# Patient Record
Sex: Female | Born: 1995 | Race: Black or African American | Hispanic: No | Marital: Single
Health system: Midwestern US, Community
[De-identification: ages and names within clinical notes are randomized; demographics above are authoritative.]

## PROBLEM LIST (undated history)

## (undated) ENCOUNTER — Inpatient Hospital Stay (HOSPITAL_COMMUNITY): Payer: Self-pay

## (undated) ENCOUNTER — Emergency Department (HOSPITAL_COMMUNITY): Admission: EM | Payer: No Typology Code available for payment source | Source: Home / Self Care

## (undated) DIAGNOSIS — A549 Gonococcal infection, unspecified: Secondary | ICD-10-CM

## (undated) DIAGNOSIS — E559 Vitamin D deficiency, unspecified: Secondary | ICD-10-CM

## (undated) DIAGNOSIS — G629 Polyneuropathy, unspecified: Secondary | ICD-10-CM

## (undated) DIAGNOSIS — F329 Major depressive disorder, single episode, unspecified: Secondary | ICD-10-CM

## (undated) DIAGNOSIS — F909 Attention-deficit hyperactivity disorder, unspecified type: Secondary | ICD-10-CM

## (undated) DIAGNOSIS — F32A Depression, unspecified: Secondary | ICD-10-CM

## (undated) DIAGNOSIS — F419 Anxiety disorder, unspecified: Secondary | ICD-10-CM

## (undated) DIAGNOSIS — Z973 Presence of spectacles and contact lenses: Secondary | ICD-10-CM

## (undated) HISTORY — DX: Polyneuropathy, unspecified: G62.9

## (undated) HISTORY — PX: OTHER SURGICAL HISTORY: SHX169

## (undated) HISTORY — PX: FRACTURE SURGERY: SHX138

## (undated) HISTORY — DX: Attention-deficit hyperactivity disorder, unspecified type: F90.9

## (undated) HISTORY — DX: Vitamin D deficiency, unspecified: E55.9

---
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
## 2015-03-26 ENCOUNTER — Emergency Department (HOSPITAL_COMMUNITY)
Admission: EM | Admit: 2015-03-26 | Discharge: 2015-03-27 | Disposition: A | Discharge status: Home / Self Care | Payer: Medicaid Other | Attending: Emergency Medicine | Admitting: Emergency Medicine

## 2015-03-26 DIAGNOSIS — N946 Dysmenorrhea, unspecified: Secondary | ICD-10-CM | POA: Diagnosis not present

## 2015-03-26 DIAGNOSIS — Z8619 Personal history of other infectious and parasitic diseases: Secondary | ICD-10-CM | POA: Insufficient documentation

## 2015-03-26 DIAGNOSIS — Z3202 Encounter for pregnancy test, result negative: Secondary | ICD-10-CM | POA: Insufficient documentation

## 2015-03-26 DIAGNOSIS — R109 Unspecified abdominal pain: Secondary | ICD-10-CM | POA: Diagnosis present

## 2015-03-26 NOTE — ED Notes (Signed)
Bed: [facility] Expected date:  Expected time:  Means of arrival:  Comments: 54F Abd pain

## 2015-03-27 ENCOUNTER — Encounter (HOSPITAL_COMMUNITY): Payer: Self-pay | Admitting: Emergency Medicine

## 2015-03-27 LAB — LIPASE, BLOOD: Lipase: 17 U/L — ABNORMAL LOW (ref 22–51)

## 2015-03-27 LAB — COMPREHENSIVE METABOLIC PANEL
ALT: 11 U/L — ABNORMAL LOW (ref 14–54)
AST: 17 U/L (ref 15–41)
Albumin: 3.8 g/dL (ref 3.5–5.0)
Alkaline Phosphatase: 77 U/L (ref 38–126)
Anion gap: 6 (ref 5–15)
BUN: 13 mg/dL (ref 6–20)
CO2: 23 mmol/L (ref 22–32)
Calcium: 8.8 mg/dL — ABNORMAL LOW (ref 8.9–10.3)
Chloride: 111 mmol/L (ref 101–111)
Creatinine, Ser: 0.56 mg/dL (ref 0.44–1.00)
GFR calc Af Amer: >60 mL/min (ref >60)
GFR calc non Af Amer: >60 mL/min (ref >60)
Glucose, Bld: 88 mg/dL (ref 65–99)
Potassium: 3.7 mmol/L (ref 3.5–5.1)
Sodium: 140 mmol/L (ref 135–145)
Total Bilirubin: 0.2 mg/dL — ABNORMAL LOW (ref 0.3–1.2)
Total Protein: 7.2 g/dL (ref 6.5–8.1)

## 2015-03-27 LAB — URINALYSIS, ROUTINE W REFLEX MICROSCOPIC
Bilirubin Urine: NEGATIVE
Glucose, UA: NEGATIVE mg/dL
Ketones, ur: NEGATIVE mg/dL
Leukocytes, UA: NEGATIVE
Nitrite: NEGATIVE
Protein, ur: NEGATIVE mg/dL
Specific Gravity, Urine: 1.025 (ref 1.005–1.030)
Urobilinogen, UA: 2 mg/dL — ABNORMAL HIGH (ref 0.0–1.0)
pH: 7 (ref 5.0–8.0)

## 2015-03-27 LAB — URINE MICROSCOPIC-ADD ON

## 2015-03-27 LAB — CBC
HCT: 34.3 % — ABNORMAL LOW (ref 36.0–46.0)
Hemoglobin: 11.4 g/dL — ABNORMAL LOW (ref 12.0–15.0)
MCH: 27.2 pg (ref 26.0–34.0)
MCHC: 33.2 g/dL (ref 30.0–36.0)
MCV: 81.9 fL (ref 78.0–100.0)
Platelets: 228 10*3/uL (ref 150–400)
RBC: 4.19 MIL/uL (ref 3.87–5.11)
RDW: 16.2 % — ABNORMAL HIGH (ref 11.5–15.5)
WBC: 10 10*3/uL (ref 4.0–10.5)

## 2015-03-27 LAB — GC/CHLAMYDIA PROBE AMP (~~LOC~~) NOT AT ARMC
Chlamydia: POSITIVE — AB
Neisseria Gonorrhea: POSITIVE — AB

## 2015-03-27 LAB — I-STAT BETA HCG BLOOD, ED (MC, WL, AP ONLY)

## 2015-03-27 MED ORDER — OXYCODONE-ACETAMINOPHEN 5-325 MG PO TABS
2.0000 | ORAL_TABLET | Freq: Once | ORAL | Status: AC
  Administered 2015-03-27: 2 via ORAL
  Filled 2015-03-27: qty 2

## 2015-03-27 MED ORDER — ONDANSETRON HCL 4 MG/2ML IJ SOLN
4.0000 mg | Freq: Once | INTRAMUSCULAR | Status: AC
  Administered 2015-03-27: 4 mg via INTRAVENOUS
  Filled 2015-03-27: qty 2

## 2015-03-27 MED ORDER — IBUPROFEN 600 MG PO TABS: 600.0000 mg | ORAL_TABLET | Freq: Four times a day (QID) | ORAL | Status: DC | PRN

## 2015-03-27 MED ORDER — KETOROLAC TROMETHAMINE 30 MG/ML IJ SOLN
30.0000 mg | Freq: Once | INTRAMUSCULAR | Status: AC
  Administered 2015-03-27: 30 mg via INTRAVENOUS
  Filled 2015-03-27: qty 1

## 2015-03-27 MED ORDER — TRAMADOL HCL 50 MG PO TABS: 50.0000 mg | ORAL_TABLET | Freq: Four times a day (QID) | ORAL | Status: DC | PRN

## 2015-03-27 MED ORDER — ONDANSETRON HCL 4 MG PO TABS: 4.0000 mg | ORAL_TABLET | Freq: Three times a day (TID) | ORAL | Status: DC | PRN
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
## 2015-03-27 NOTE — Discharge Instructions (Signed)

## 2015-03-27 NOTE — ED Notes (Addendum)
Pt states she has had 10/10 abdominal pain since yesterday. Pt started her period yesterday and stated that it has been much heavier than usual and that she has been passing clots. Has been sexually active w/o protection recently. Alert and oriented.

## 2015-03-29 ENCOUNTER — Telehealth (HOSPITAL_BASED_OUTPATIENT_CLINIC_OR_DEPARTMENT_OTHER): Payer: Self-pay | Admitting: Emergency Medicine

## 2015-03-29 NOTE — Telephone Encounter (Signed)
Chart handoff to EDP for treatment plan for GC and Chlamydia 

## 2015-03-31 ENCOUNTER — Telehealth (HOSPITAL_COMMUNITY): Payer: Self-pay

## 2015-03-31 NOTE — Telephone Encounter (Signed)
Chart reviewed by Celene Skeen PA "Azithromycin 1 gram po once plus cefixime 400 mg po once , partner also needs to seek tx"   03/31/2015 @ 14:20 Left message for pt to return call.

## 2015-04-01 ENCOUNTER — Emergency Department (HOSPITAL_COMMUNITY): Payer: Medicaid Other

## 2015-04-01 ENCOUNTER — Emergency Department (HOSPITAL_COMMUNITY)
Admission: EM | Admit: 2015-04-01 | Discharge: 2015-04-01 | Disposition: A | Discharge status: Home / Self Care | Payer: Medicaid Other | Attending: Emergency Medicine | Admitting: Emergency Medicine

## 2015-04-01 ENCOUNTER — Encounter (HOSPITAL_COMMUNITY): Payer: Self-pay | Admitting: Emergency Medicine

## 2015-04-01 DIAGNOSIS — R102 Pelvic and perineal pain: Secondary | ICD-10-CM | POA: Diagnosis present

## 2015-04-01 DIAGNOSIS — N739 Female pelvic inflammatory disease, unspecified: Secondary | ICD-10-CM | POA: Diagnosis not present

## 2015-04-01 DIAGNOSIS — R1030 Lower abdominal pain, unspecified: Secondary | ICD-10-CM

## 2015-04-01 DIAGNOSIS — N730 Acute parametritis and pelvic cellulitis: Secondary | ICD-10-CM

## 2015-04-01 DIAGNOSIS — A549 Gonococcal infection, unspecified: Secondary | ICD-10-CM

## 2015-04-01 DIAGNOSIS — A749 Chlamydial infection, unspecified: Secondary | ICD-10-CM

## 2015-04-01 LAB — BASIC METABOLIC PANEL
ANION GAP: 11 (ref 5–15)
BUN: <5 mg/dL — ABNORMAL LOW (ref 6–20)
CALCIUM: 8.4 mg/dL — AB (ref 8.9–10.3)
CHLORIDE: 99 mmol/L — AB (ref 101–111)
CO2: 24 mmol/L (ref 22–32)
Creatinine, Ser: 0.67 mg/dL (ref 0.44–1.00)
GFR calc non Af Amer: >60 mL/min (ref >60)
GLUCOSE: 78 mg/dL (ref 65–99)
Potassium: 3.2 mmol/L — ABNORMAL LOW (ref 3.5–5.1)
Sodium: 134 mmol/L — ABNORMAL LOW (ref 135–145)

## 2015-04-01 LAB — CBC WITH DIFFERENTIAL/PLATELET
BASOS ABS: 0 10*3/uL (ref 0.0–0.1)
Basophils Relative: 0 %
Eosinophils Absolute: 0 10*3/uL (ref 0.0–0.7)
Eosinophils Relative: 0 %
HEMATOCRIT: 31 % — AB (ref 36.0–46.0)
HEMOGLOBIN: 10.2 g/dL — AB (ref 12.0–15.0)
LYMPHS PCT: 14 %
Lymphs Abs: 1.5 10*3/uL (ref 0.7–4.0)
MCH: 26.4 pg (ref 26.0–34.0)
MCHC: 32.9 g/dL (ref 30.0–36.0)
MCV: 80.1 fL (ref 78.0–100.0)
MONO ABS: 1.1 10*3/uL — AB (ref 0.1–1.0)
MONOS PCT: 11 %
NEUTROS ABS: 7.6 10*3/uL (ref 1.7–7.7)
NEUTROS PCT: 75 %
Platelets: 349 10*3/uL (ref 150–400)
RBC: 3.87 MIL/uL (ref 3.87–5.11)
RDW: 15.8 % — AB (ref 11.5–15.5)
WBC: 10.2 10*3/uL (ref 4.0–10.5)

## 2015-04-01 MED ORDER — ONDANSETRON HCL 4 MG/2ML IJ SOLN
4.0000 mg | Freq: Four times a day (QID) | INTRAMUSCULAR | Status: DC | PRN
  Administered 2015-04-01: 4 mg via INTRAVENOUS
  Filled 2015-04-01: qty 2

## 2015-04-01 MED ORDER — SODIUM CHLORIDE 0.9 % IV SOLN
Freq: Once | INTRAVENOUS | Status: AC
  Administered 2015-04-01: 10:00:00 via INTRAVENOUS

## 2015-04-01 MED ORDER — HYDROCODONE-ACETAMINOPHEN 5-325 MG PO TABS: 1.0000 | ORAL_TABLET | Freq: Four times a day (QID) | ORAL | Status: DC | PRN

## 2015-04-01 MED ORDER — AZITHROMYCIN 250 MG PO TABS
1000.0000 mg | ORAL_TABLET | Freq: Once | ORAL | Status: AC
  Administered 2015-04-01: 1000 mg via ORAL
  Filled 2015-04-01: qty 4

## 2015-04-01 MED ORDER — DOXYCYCLINE HYCLATE 100 MG PO CAPS: 100.0000 mg | ORAL_CAPSULE | Freq: Two times a day (BID) | ORAL | Status: DC

## 2015-04-01 MED ORDER — CEFTRIAXONE SODIUM 250 MG IJ SOLR
250.0000 mg | Freq: Once | INTRAMUSCULAR | Status: AC
  Administered 2015-04-01: 250 mg via INTRAMUSCULAR
  Filled 2015-04-01: qty 250

## 2015-04-01 MED ORDER — MORPHINE SULFATE (PF) 4 MG/ML IV SOLN
4.0000 mg | Freq: Once | INTRAVENOUS | Status: AC
  Administered 2015-04-01: 4 mg via INTRAVENOUS
  Filled 2015-04-01: qty 1

## 2015-04-01 MED ORDER — IBUPROFEN 800 MG PO TABS: 800.0000 mg | ORAL_TABLET | Freq: Three times a day (TID) | ORAL | Status: DC

## 2015-04-01 MED ORDER — LIDOCAINE HCL (PF) 1 % IJ SOLN
INTRAMUSCULAR | Status: AC
  Administered 2015-04-01: 5 mL
  Filled 2015-04-01: qty 5
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
## 2015-04-01 NOTE — ED Notes (Signed)
Patient returned from Ultrasound. 

## 2015-04-01 NOTE — ED Notes (Signed)
Pt reports to ED for evaluation of pelvic pain that radiates into right side. Denies urinary symptoms. Reports cold chills and waking up in the morning sweating. Pt reports recent ED visit for menstrual cramps. A/O x4.

## 2015-04-01 NOTE — Discharge Instructions (Signed)
You have been diagnosed with gonorrhea and chlamydia and Pelvic Inflammatory Disease (PID), which was partly treated here in the ED. You will need to begin taking the Doxycycline immediately and finish the full course, even if you begin to feel better. Take ibuprofen as needed for pain. Take 1-2 vicodin for severe pain. Do not combine vicodin with oxycodone.   Followup with PCP within 2-3 days. A resource guide is below to help you find a PCP.  Return to ED should symptoms worsen.   Emergency Department Resource Guide 1) Find a Doctor and Pay Out of Pocket Although you won't have to find out who is covered by your insurance plan, it is a good idea to ask around and get recommendations. You will then need to call the office and see if the doctor you have chosen will accept you as a new patient and what types of options they offer for patients who are self-pay. Some doctors offer discounts or will set up payment plans for their patients who do not have insurance, but you will need to ask so you aren't surprised when you get to your appointment.  2) Contact Your Local Health Department Not all health departments have doctors that can see patients for sick visits, but many do, so it is worth a call to see if yours does. If you don't know where your local health department is, you can check in your phone book. The CDC also has a tool to help you locate your state's health department, and many state websites also have listings of all of their local health departments.  3) Find a Walk-in Clinic If your illness is not likely to be very severe or complicated, you may want to try a walk in clinic. These are popping up all over the country in pharmacies, drugstores, and shopping centers. They're usually staffed by nurse practitioners or physician assistants that have been trained to treat common illnesses and complaints. They're usually fairly quick and inexpensive. However, if you have serious medical issues or  chronic medical problems, these are probably not your best option.  No Primary Care Doctor: - Call Health Connect at  [phone] - they can help you locate a primary care doctor that  accepts your insurance, provides certain services, etc. - Physician Referral Service- [phone]  Chronic Pain Problems: Organization         Address  Phone   Notes  [facility]  [phone] Patients need to be referred by their primary care doctor.   Medication Assistance: Organization         Address  Phone   Notes  [facility] Medication [facility] [address] [place] [place], [place] [address] [phone] --Must be a resident of [facility] -- Must have NO insurance coverage whatsoever (no Medicaid/ Medicare, etc.) -- The pt. MUST have a primary care doctor that directs their care regularly and follows them in the community   [facility]  [phone]   [employer]  [phone]    Agencies that provide inexpensive medical care: Organization         Address  Phone   Notes  [facility]  [phone]   [facility]    [phone]   [facility] [address] [place], [place] [address] [phone]   [facility] [address] [place], [place] [phone]   [facility]anned [facility]    [phone]  [facility]    [phone]   [facility]  [address], [place] Phone:  [phone], Fax:  [phone] Hours of Operation:  9 am - 6 pm, M-F.  Also accepts Medicaid/Medicare and self-pay.  [facility] for [place] [address], [address], [place] Phone: [phone], Fax: [phone]. Hours of Operation:  8:30 am - 5:30 pm, M-F.  Also accepts Medicaid and self-pay.  [facility] [place] [address], [place] Phone: [phone]   [place], [place], [place]  [phone], Ext. [phone] Mondays & Thursdays: 7-9 AM.  First 15 patients are seen on a first come, first serve basis.    [place] Providers:  Organization         Address  Phone   Notes  [facility] [address], [place] [phone] Also accepts self-pay patients.  [facility] [address], [place]  [phone]   [place], [address], [place] [phone]   [facility] [address] [place] [phone]   Lucianne Lei [address], [place]   [phone] Only accepts [place] Access [place] patients after they have their name applied to their card.   Self-Pay (no insurance) in [facility]:  Organization         Address  Phone   Notes  Sickle Cell Patients, [facility] [place] [phone]   [facility] [place] [phone]   [facility] [place]  [place], [place], [place] [phone]   [facility]/Dr. Osei-Bonsu  [address] [place] or [address], [place] [phone] Phone number for both [place] and [place] locations is the same.  [facility] [address] [place] [phone]   [facility] [address] [place] or [address] [phone]   [facility] [address], [place] [phone], phone; [phone], fax Sees patients 1st and 3rd Saturday of every month.  Must not qualify for public or private insurance (i.e. Medicaid, Medicare, [place] Health Choice, Veterans' Benefits)  Household income should be no more than 200% of the poverty level The clinic cannot treat you if you are pregnant or think you are pregnant  Sexually transmitted  diseases are not treated at the clinic.    Dental Care: Organization         Address  Phone  Notes  [facility] Department [facility] [place] [phone] Accepts children up to age 15 who are enrolled in [place] or [place]; pregnant women with a Medicaid card; and children who have applied for Medicaid or [place] Health Choice, but were declined, whose parents can pay a reduced fee at time of service.  [facility]  [address], [place] [phone] Accepts children up to age 40 who are enrolled in [place] or [place]; pregnant women with a Medicaid card; and children who have applied for Medicaid or [place] Health Choice, but were declined, whose parents can pay a reduced fee at time  of service.  °[facility] ° [address], [place] [phone] Patients are seen by appointment only. Walk-ins are not accepted. [facility] will see patients 18 years of age and older. °Monday - Tuesday (8am-5pm) °Most Wednesdays (8:30-5pm) °$30 per visit, cash only  °[facility] ° [address], [place] [phone] Patients are seen by appointment only. Walk-ins are not accepted. [facility] will see patients 18 years of age and older. °One Wednesday Evening (Monthly: Volunteer Based).  $30 per visit, cash only  °[facility]  [phone] for adults; Children under age 4, call Graduate Pediatric Dentistry at [phone]. Children aged 4-14, please call [phone] to request a pediatric application. ° Dental services are provided in all areas of dental care including fillings, crowns and bridges, complete and partial dentures, implants, gum treatment, root canals, and extractions. Preventive care is also provided. Treatment is provided to both adults and children. °Patients are selected via a  lottery and there is often a waiting list. °  °[facility] [address], °[place] ° ([phone] [email or web address] °  °[facility] [address], [place], [place] [phone], Ext. [phone] Second and Fourth Thursday of each month, opens at 6:30 AM; Clinic ends at 9 AM.  Patients are seen on a first-come first-served basis, and a limited number are seen during each clinic.  ° °[facility] ° [address], [place], [place] [phone]   Eligibility Requirements °You must have lived in [place], [place], or [place] counties for at least the last three months. °  You cannot be eligible for state or federal sponsored healthcare insurance, including Veterans Administration, Medicaid, or Medicare. °  You generally cannot be eligible for healthcare insurance through your employer.  °  How to apply: °Eligibility screenings are held every Tuesday and Wednesday afternoon from 1:00 pm until 4:00 pm. You do not need an appointment for the interview!  °[facility] [address], [place], [place] [phone]   °[facility]  [phone]   °[facility]  [phone]   °[facility]  336-570-6415   ° °Behavioral Health Resources in the Community: °Intensive Outpatient Programs °Organization         Address  Phone  Notes  °[facility] [address], [place], [place] [phone]   °[facility] [address], [place], [place] [phone]   °[facility] [address], [place], [place] ° [phone]   °[facility] [address],  °[place], [place] 1-800-853-5163 or 336-641-4981   °Substance Abuse Resources °Organization         Address  Phone  Notes  °[facility]  336-882-2125   °[facility]  [phone]   °[facility]  336-285-9073   °[facility]  [phone]   °Residential &  Outpatient Substance Abuse Program  1-800-659-3381   °Psychological Services °Organization         Address  Phone  Notes  °[facility]  336- [phone]   °[facility]  336- [phone]   °[facility] [address], [place] 1-800-853-5163 or 336-641-4981   ° °Mobile Crisis Teams °Organization         Address  Phone  Notes  °[facility], Mobile Crisis Care Unit  1-877-626-1772   °Assertive °Psychotherapeutic   Services  [address] [place], [place]   [facility] [address], [place] [phone]    Self-Help/Support Groups Organization         Address  Phone             Notes  [place]. [facility] [place] - variety of support groups  [place] Call for more information  Narcotics Anonymous (NA), [facility] [address], [employer] [place]  2 meetings at this location   Special educational needs teacher         Address  Phone  Notes  [facility] [place],    [place]  1-6361518779   [facility]  [address] [place] [address], [place], [place]   [place], [place] [phone] Admissions: 8am-3pm M-F  [facility]stance [place] [address]    [place], [place] [phone]   [facility] [address] [place], [place], [place]   [facility] [address],  [place], [place]   [facility] - Intensive Outpatient [address], [place], [place]   [facility] ([place].,  [place], [place] [phone]-(302)[phone] or [phone]   [facility] ([facility]) [address] [place], [place] Accepts Medicaid  [facility] [place] [address],  [place] 1-7401063853 Substance Abuse/Addiction Treatment   [facility] Organization          Address  Phone  Notes  [facility]  6098293892   Domenic Schwab, PhD [address] [place], [place]   [phone] or [phone]   [place], [place] [phone]   [facility] [address], [place], [place] [phone] Insurance/Medicaid/sponsorship through [facility] [address] [place]                                    [place], [place] [phone] [place] [address], [place] [phone]    Dr. Adele Schilder  [phone]   [facility] Dept. 1) [address] [place] 2) [place] 3)  [place] [address], [place] [phone]  [phone]   [place] [phone] or [phone] (After Hours)

## 2015-04-01 NOTE — ED Notes (Signed)
Pt denies vaginal discharge. Reports being sexually active with 1 partner.

## 2015-04-01 NOTE — ED Notes (Signed)
Patient transported to Ultrasound 

## 2015-04-02 ENCOUNTER — Telehealth (HOSPITAL_BASED_OUTPATIENT_CLINIC_OR_DEPARTMENT_OTHER): Payer: Self-pay | Admitting: Emergency Medicine

## 2015-04-23 ENCOUNTER — Emergency Department (HOSPITAL_COMMUNITY)
Admission: EM | Admit: 2015-04-23 | Discharge: 2015-04-23 | Disposition: A | Discharge status: Home / Self Care | Payer: Medicaid Other | Attending: Emergency Medicine | Admitting: Emergency Medicine

## 2015-04-23 ENCOUNTER — Encounter (HOSPITAL_COMMUNITY): Payer: Self-pay | Admitting: Emergency Medicine

## 2015-04-23 DIAGNOSIS — R110 Nausea: Secondary | ICD-10-CM | POA: Diagnosis present

## 2015-04-23 DIAGNOSIS — H9319 Tinnitus, unspecified ear: Secondary | ICD-10-CM | POA: Insufficient documentation

## 2015-04-23 DIAGNOSIS — F1490 Cocaine use, unspecified, uncomplicated: Secondary | ICD-10-CM | POA: Insufficient documentation

## 2015-04-23 DIAGNOSIS — R61 Generalized hyperhidrosis: Secondary | ICD-10-CM | POA: Diagnosis not present

## 2015-04-23 DIAGNOSIS — R109 Unspecified abdominal pain: Secondary | ICD-10-CM | POA: Insufficient documentation

## 2015-04-23 DIAGNOSIS — H538 Other visual disturbances: Secondary | ICD-10-CM | POA: Insufficient documentation

## 2015-04-23 DIAGNOSIS — F1210 Cannabis abuse, uncomplicated: Secondary | ICD-10-CM | POA: Diagnosis not present

## 2015-04-23 DIAGNOSIS — R251 Tremor, unspecified: Secondary | ICD-10-CM | POA: Diagnosis not present

## 2015-04-23 DIAGNOSIS — Z3202 Encounter for pregnancy test, result negative: Secondary | ICD-10-CM | POA: Insufficient documentation

## 2015-04-23 DIAGNOSIS — R404 Transient alteration of awareness: Secondary | ICD-10-CM | POA: Insufficient documentation

## 2015-04-23 DIAGNOSIS — Z791 Long term (current) use of non-steroidal anti-inflammatories (NSAID): Secondary | ICD-10-CM | POA: Diagnosis not present

## 2015-04-23 DIAGNOSIS — Z8619 Personal history of other infectious and parasitic diseases: Secondary | ICD-10-CM | POA: Diagnosis not present

## 2015-04-23 LAB — COMPREHENSIVE METABOLIC PANEL
ALBUMIN: 3.8 g/dL (ref 3.5–5.0)
ALT: 11 U/L — AB (ref 14–54)
AST: 19 U/L (ref 15–41)
Alkaline Phosphatase: 71 U/L (ref 38–126)
Anion gap: 7 (ref 5–15)
BUN: 11 mg/dL (ref 6–20)
CHLORIDE: 108 mmol/L (ref 101–111)
CO2: 24 mmol/L (ref 22–32)
CREATININE: 0.68 mg/dL (ref 0.44–1.00)
Calcium: 8.9 mg/dL (ref 8.9–10.3)
GFR calc non Af Amer: >60 mL/min (ref >60)
GLUCOSE: 93 mg/dL (ref 65–99)
Potassium: 3.4 mmol/L — ABNORMAL LOW (ref 3.5–5.1)
SODIUM: 139 mmol/L (ref 135–145)
Total Bilirubin: 0.4 mg/dL (ref 0.3–1.2)
Total Protein: 7.5 g/dL (ref 6.5–8.1)

## 2015-04-23 LAB — CBC WITH DIFFERENTIAL/PLATELET
Basophils Absolute: 0 10*3/uL (ref 0.0–0.1)
Basophils Relative: 0 %
EOS ABS: 0.3 10*3/uL (ref 0.0–0.7)
Eosinophils Relative: 4 %
HCT: 35 % — ABNORMAL LOW (ref 36.0–46.0)
HEMOGLOBIN: 11.6 g/dL — AB (ref 12.0–15.0)
LYMPHS ABS: 2.2 10*3/uL (ref 0.7–4.0)
Lymphocytes Relative: 29 %
MCH: 27 pg (ref 26.0–34.0)
MCHC: 33.1 g/dL (ref 30.0–36.0)
MCV: 81.6 fL (ref 78.0–100.0)
MONOS PCT: 7 %
Monocytes Absolute: 0.5 10*3/uL (ref 0.1–1.0)
NEUTROS PCT: 60 %
Neutro Abs: 4.7 10*3/uL (ref 1.7–7.7)
Platelets: 216 10*3/uL (ref 150–400)
RBC: 4.29 MIL/uL (ref 3.87–5.11)
RDW: 18 % — ABNORMAL HIGH (ref 11.5–15.5)
WBC: 7.7 10*3/uL (ref 4.0–10.5)

## 2015-04-23 LAB — RAPID URINE DRUG SCREEN, HOSP PERFORMED
AMPHETAMINES: NOT DETECTED
Barbiturates: NOT DETECTED
Benzodiazepines: NOT DETECTED
Cocaine: POSITIVE — AB
Opiates: NOT DETECTED
TETRAHYDROCANNABINOL: POSITIVE — AB

## 2015-04-23 LAB — POC URINE PREG, ED: PREG TEST UR: NEGATIVE

## 2015-04-23 MED ORDER — SODIUM CHLORIDE 0.9 % IV BOLUS (SEPSIS)
1000.0000 mL | Freq: Once | INTRAVENOUS | Status: AC
  Administered 2015-04-23: 1000 mL via INTRAVENOUS

## 2015-04-23 MED ORDER — ONDANSETRON 4 MG PO TBDP
4.0000 mg | ORAL_TABLET | Freq: Once | ORAL | Status: AC
  Administered 2015-04-23: 4 mg via ORAL
  Filled 2015-04-23: qty 1

## 2015-04-23 NOTE — ED Notes (Signed)
Pt ambulated in hallway without difficulty

## 2015-04-23 NOTE — ED Notes (Signed)
Pt is alert and oriented,  I explained the dangers of using drugs and told her she needed to find another way to cope,  Pt is alert and oriented in NAD,  She is tearful as I am trying to tell her the outcome of long term drug use

## 2015-04-23 NOTE — ED Notes (Signed)
Per EMS- friend reports pt had 5-15 seconds of seizure activity. Patient remembers sudden hot flash, nausea, emesis x1 and then feeling like she couldn't see/hear anything-still had awareness of surroundings. Bystander says patient was completely A&Ox4 after seizure activity stopped. A&Ox4 at this time. VS: 118/72 HR 80 SpO2 97% on RA. 20 G PIV in left wrist. Pt just started menstrual cycle today-was seen last month for dysmenorrhea. No seizure history. Attempted using cocaine for the first time yesterday and also used this along with marijuana today. C/o menstrual cramps and nausea.

## 2015-04-23 NOTE — Discharge Instructions (Signed)
You were seen today for an alteration of consciousness. It is unclear whether he had a syncopal episode or a seizure. Given the cocaine use, you could have had a seizure. Your workup is otherwise reassuring. Given that this is the first time of it for you, you do not need further workup at this time and her symptoms were likely related to cocaine use. You need to abstain from cocaine use. You should not drive or operate heavy machinery under the influence of drugs or alcohol.  Stimulant Use Disorder-Cocaine Cocaine is one of a group of powerful drugs called stimulants. Cocaine has medical uses for stopping nosebleeds and for pain control before minor nose or dental surgery. However, cocaine is misused because of the effects that it produces. These effects include:   A feeling of extreme pleasure.  Alertness.  High energy. Common street names for cocaine include coke, crack, blow, snow, and nose candy. Cocaine is snorted, dissolved in water and injected, or smoked.  Stimulants are addictive because they activate regions of the brain that produce both the pleasurable sensation of "reward" and psychological dependence. Together, these actions account for loss of control and the rapid development of drug dependence. This means you become ill without the drug (withdrawal) and need to keep using it to function.  Stimulant use disorder is use of stimulants that disrupts your daily life. It disrupts relationships with family and friends and how you do your job. Cocaine increases your blood pressure and heart rate. It can cause a heart attack or stroke. Cocaine can also cause death from irregular heart rate or seizures. SYMPTOMS Symptoms of stimulant use disorder with cocaine include:  Use of cocaine in larger amounts or over a longer period of time than intended.  Unsuccessful attempts to cut down or control cocaine use.  A lot of time spent obtaining, using, or recovering from the effects of  cocaine.  A strong desire or urge to use cocaine (craving).  Continued use of cocaine in spite of major problems at work, school, or home because of use.  Continued use of cocaine in spite of relationship problems because of use.  Giving up or cutting down on important life activities because of cocaine use.  Use of cocaine over and over in situations when it is physically hazardous, such as driving a car.  Continued use of cocaine in spite of a physical problem that is likely related to use. Physical problems can include:  Malnutrition.  Nosebleeds.  Chest pain.  High blood pressure.  A hole that develops between the part of your nose that separates your nostrils (perforated nasal septum).  Lung and kidney damage.  Continued use of cocaine in spite of a mental problem that is likely related to use. Mental problems can include:  Schizophrenia-like symptoms.  Depression.  Bipolar mood swings.  Anxiety.  Sleep problems.  Need to use more and more cocaine to get the same effect, or lessened effect over time with use of the same amount of cocaine (tolerance).  Having withdrawal symptoms when cocaine use is stopped, or using cocaine to reduce or avoid withdrawal symptoms. Withdrawal symptoms include:  Depressed or irritable mood.  Low energy or restlessness.  Bad dreams.  Poor or excessive sleep.  Increased appetite. DIAGNOSIS Stimulant use disorder is diagnosed by your health care provider. You may be asked questions about your cocaine use and how it affects your life. A physical exam may be done. A drug screen may be ordered. You may be  referred to a mental health professional. The diagnosis of stimulant use disorder requires at least two symptoms within 12 months. The type of stimulant use disorder depends on the number of signs and symptoms you have. The type may be:  Mild. Two or three signs and symptoms.  Moderate. Four or five signs and symptoms.  Severe.  Six or more signs and symptoms. TREATMENT Treatment for stimulant use disorder is usually provided by mental health professionals with training in substance use disorders. The following options are available:  Counseling or talk therapy. Talk therapy addresses the reasons you use cocaine and ways to keep you from using again. Goals of talk therapy include:  Identifying and avoiding triggers for use.  Handling cravings.  Replacing use with healthy activities.  Support groups. Support groups provide emotional support, advice, and guidance.  Medicine. Certain medicines may decrease cocaine cravings or withdrawal symptoms. HOME CARE INSTRUCTIONS  Take medicines only as directed by your health care provider.  Identify the people and activities that trigger your cocaine use and avoid them.  Keep all follow-up visits as directed by your health care provider. SEEK MEDICAL CARE IF:  Your symptoms get worse or you relapse.  You are not able to take medicines as directed. SEEK IMMEDIATE MEDICAL CARE IF:  You have serious thoughts about hurting yourself or others.  You have a seizure, chest pain, sudden weakness, or loss of speech or vision. FOR MORE INFORMATION  National Institute on Drug Abuse: http://www.price-smith.com/  Substance Abuse and Mental Health Services Administration: SkateOasis.com.pt   This information is not intended to replace advice given to you by your health care provider. Make sure you discuss any questions you have with your health care provider.   Document Released: 06/14/2000 Document Revised: 07/08/2014 Document Reviewed: 06/30/2013 Elsevier Interactive Patient Education [employer]
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
## 2015-05-27 ENCOUNTER — Encounter (HOSPITAL_COMMUNITY): Payer: Self-pay | Admitting: Emergency Medicine

## 2015-05-27 ENCOUNTER — Emergency Department (HOSPITAL_COMMUNITY): Payer: Medicaid Other

## 2015-05-27 ENCOUNTER — Emergency Department (HOSPITAL_COMMUNITY)
Admission: EM | Admit: 2015-05-27 | Discharge: 2015-05-27 | Discharge status: Against Medical Advice | Payer: Medicaid Other | Attending: Emergency Medicine | Admitting: Emergency Medicine

## 2015-05-27 DIAGNOSIS — M79601 Pain in right arm: Secondary | ICD-10-CM

## 2015-05-27 DIAGNOSIS — Y9289 Other specified places as the place of occurrence of the external cause: Secondary | ICD-10-CM | POA: Insufficient documentation

## 2015-05-27 DIAGNOSIS — Z791 Long term (current) use of non-steroidal anti-inflammatories (NSAID): Secondary | ICD-10-CM | POA: Insufficient documentation

## 2015-05-27 DIAGNOSIS — Y998 Other external cause status: Secondary | ICD-10-CM | POA: Diagnosis not present

## 2015-05-27 DIAGNOSIS — S0990XA Unspecified injury of head, initial encounter: Secondary | ICD-10-CM | POA: Diagnosis not present

## 2015-05-27 DIAGNOSIS — Y9389 Activity, other specified: Secondary | ICD-10-CM | POA: Insufficient documentation

## 2015-05-27 DIAGNOSIS — S4991XA Unspecified injury of right shoulder and upper arm, initial encounter: Secondary | ICD-10-CM | POA: Diagnosis not present

## 2015-05-27 DIAGNOSIS — G44311 Acute post-traumatic headache, intractable: Secondary | ICD-10-CM

## 2015-05-27 DIAGNOSIS — Z8619 Personal history of other infectious and parasitic diseases: Secondary | ICD-10-CM | POA: Diagnosis not present
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
## 2015-05-27 NOTE — ED Notes (Signed)
Pt sts she has to leave to pick up her son. Pt made aware she will be leaving against medical advice, she voiced understanding and stated she will try to come back later.
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
## 2015-06-24 ENCOUNTER — Encounter (HOSPITAL_COMMUNITY): Payer: Self-pay | Admitting: Emergency Medicine

## 2015-06-24 ENCOUNTER — Emergency Department (HOSPITAL_COMMUNITY)
Admission: EM | Admit: 2015-06-24 | Discharge: 2015-06-24 | Discharge status: Against Medical Advice | Payer: Medicaid Other | Attending: Emergency Medicine | Admitting: Emergency Medicine

## 2015-06-24 DIAGNOSIS — F17210 Nicotine dependence, cigarettes, uncomplicated: Secondary | ICD-10-CM | POA: Diagnosis not present

## 2015-06-24 DIAGNOSIS — K1379 Other lesions of oral mucosa: Secondary | ICD-10-CM | POA: Diagnosis not present

## 2015-06-24 DIAGNOSIS — Z202 Contact with and (suspected) exposure to infections with a predominantly sexual mode of transmission: Secondary | ICD-10-CM | POA: Diagnosis not present
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
## 2015-06-24 NOTE — ED Notes (Signed)
Nurse first called for patient x 3, no answer.

## 2015-06-26 ENCOUNTER — Emergency Department (HOSPITAL_COMMUNITY)
Admission: EM | Admit: 2015-06-26 | Discharge: 2015-06-26 | Disposition: A | Discharge status: Home / Self Care | Payer: Medicaid Other | Attending: Emergency Medicine | Admitting: Emergency Medicine

## 2015-06-26 ENCOUNTER — Encounter (HOSPITAL_COMMUNITY): Payer: Self-pay | Admitting: *Deleted

## 2015-06-26 DIAGNOSIS — Z3202 Encounter for pregnancy test, result negative: Secondary | ICD-10-CM | POA: Diagnosis not present

## 2015-06-26 DIAGNOSIS — Z8619 Personal history of other infectious and parasitic diseases: Secondary | ICD-10-CM | POA: Insufficient documentation

## 2015-06-26 DIAGNOSIS — R197 Diarrhea, unspecified: Secondary | ICD-10-CM | POA: Diagnosis not present

## 2015-06-26 DIAGNOSIS — R109 Unspecified abdominal pain: Secondary | ICD-10-CM | POA: Diagnosis present

## 2015-06-26 DIAGNOSIS — Z87828 Personal history of other (healed) physical injury and trauma: Secondary | ICD-10-CM | POA: Diagnosis not present

## 2015-06-26 DIAGNOSIS — F17210 Nicotine dependence, cigarettes, uncomplicated: Secondary | ICD-10-CM | POA: Diagnosis not present

## 2015-06-26 DIAGNOSIS — N898 Other specified noninflammatory disorders of vagina: Secondary | ICD-10-CM | POA: Diagnosis not present

## 2015-06-26 LAB — URINE MICROSCOPIC-ADD ON: RBC / HPF: NONE SEEN RBC/hpf (ref 0–5)

## 2015-06-26 LAB — COMPREHENSIVE METABOLIC PANEL
ALBUMIN: 3.9 g/dL (ref 3.5–5.0)
ALK PHOS: 76 U/L (ref 38–126)
ALT: 13 U/L — AB (ref 14–54)
AST: 20 U/L (ref 15–41)
Anion gap: 7 (ref 5–15)
BILIRUBIN TOTAL: 0.6 mg/dL (ref 0.3–1.2)
BUN: 9 mg/dL (ref 6–20)
CALCIUM: 9.3 mg/dL (ref 8.9–10.3)
CO2: 27 mmol/L (ref 22–32)
CREATININE: 0.72 mg/dL (ref 0.44–1.00)
Chloride: 105 mmol/L (ref 101–111)
GFR calc Af Amer: >60 mL/min (ref >60)
GFR calc non Af Amer: >60 mL/min (ref >60)
GLUCOSE: 83 mg/dL (ref 65–99)
Potassium: 4.1 mmol/L (ref 3.5–5.1)
SODIUM: 139 mmol/L (ref 135–145)
TOTAL PROTEIN: 7 g/dL (ref 6.5–8.1)

## 2015-06-26 LAB — CBC
HCT: 39.7 % (ref 36.0–46.0)
Hemoglobin: 12.6 g/dL (ref 12.0–15.0)
MCH: 27 pg (ref 26.0–34.0)
MCHC: 31.7 g/dL (ref 30.0–36.0)
MCV: 85.2 fL (ref 78.0–100.0)
PLATELETS: 263 10*3/uL (ref 150–400)
RBC: 4.66 MIL/uL (ref 3.87–5.11)
RDW: 16.1 % — AB (ref 11.5–15.5)
WBC: 6.7 10*3/uL (ref 4.0–10.5)

## 2015-06-26 LAB — WET PREP, GENITAL
SPERM: NONE SEEN
TRICH WET PREP: NONE SEEN
YEAST WET PREP: NONE SEEN

## 2015-06-26 LAB — URINALYSIS, ROUTINE W REFLEX MICROSCOPIC
Bilirubin Urine: NEGATIVE
GLUCOSE, UA: NEGATIVE mg/dL
HGB URINE DIPSTICK: NEGATIVE
Ketones, ur: NEGATIVE mg/dL
Nitrite: NEGATIVE
Protein, ur: NEGATIVE mg/dL
Specific Gravity, Urine: 1.009 (ref 1.005–1.030)
pH: 7.5 (ref 5.0–8.0)

## 2015-06-26 LAB — LIPASE, BLOOD: Lipase: 19 U/L (ref 11–51)

## 2015-06-26 LAB — I-STAT BETA HCG BLOOD, ED (MC, WL, AP ONLY)

## 2015-06-26 MED ORDER — IBUPROFEN 800 MG PO TABS: 800.0000 mg | ORAL_TABLET | Freq: Three times a day (TID) | ORAL | Status: DC

## 2015-06-26 MED ORDER — METRONIDAZOLE 500 MG PO TABS: 500.0000 mg | ORAL_TABLET | Freq: Two times a day (BID) | ORAL | Status: DC

## 2015-06-26 NOTE — ED Notes (Signed)
Finished Pelvic Exam. Waiting on results.

## 2015-06-26 NOTE — ED Notes (Signed)
Pt reports pelvic pain and diarrhea x2 days, worse today - pt also admits to small amount of white vaginal discharge, denies vaginal bleeding, fever or vomiting.

## 2015-06-26 NOTE — ED Notes (Signed)
MD Miller at the bedside  

## 2015-06-26 NOTE — Discharge Instructions (Signed)

## 2015-06-26 NOTE — ED Notes (Signed)
Phlebotomy at the bedside  
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
## 2015-06-27 LAB — RPR: RPR Ser Ql: NONREACTIVE

## 2015-06-30 LAB — GC/CHLAMYDIA PROBE AMP (~~LOC~~) NOT AT ARMC
Chlamydia: NEGATIVE
Neisseria Gonorrhea: NEGATIVE

## 2015-12-05 ENCOUNTER — Emergency Department (HOSPITAL_COMMUNITY)
Admission: EM | Admit: 2015-12-05 | Discharge: 2015-12-05 | Disposition: A | Discharge status: Home / Self Care | Payer: Medicaid Other | Attending: Emergency Medicine | Admitting: Emergency Medicine

## 2015-12-05 ENCOUNTER — Encounter (HOSPITAL_COMMUNITY): Payer: Self-pay | Admitting: *Deleted

## 2015-12-05 DIAGNOSIS — S199XXA Unspecified injury of neck, initial encounter: Secondary | ICD-10-CM | POA: Diagnosis not present

## 2015-12-05 DIAGNOSIS — Y9389 Activity, other specified: Secondary | ICD-10-CM | POA: Diagnosis not present

## 2015-12-05 DIAGNOSIS — S59912A Unspecified injury of left forearm, initial encounter: Secondary | ICD-10-CM | POA: Diagnosis not present

## 2015-12-05 DIAGNOSIS — Y998 Other external cause status: Secondary | ICD-10-CM | POA: Insufficient documentation

## 2015-12-05 DIAGNOSIS — S4991XA Unspecified injury of right shoulder and upper arm, initial encounter: Secondary | ICD-10-CM | POA: Insufficient documentation

## 2015-12-05 DIAGNOSIS — S29001A Unspecified injury of muscle and tendon of front wall of thorax, initial encounter: Secondary | ICD-10-CM | POA: Insufficient documentation

## 2015-12-05 DIAGNOSIS — T7411XA Adult physical abuse, confirmed, initial encounter: Secondary | ICD-10-CM | POA: Insufficient documentation

## 2015-12-05 DIAGNOSIS — F17210 Nicotine dependence, cigarettes, uncomplicated: Secondary | ICD-10-CM | POA: Diagnosis not present

## 2015-12-05 DIAGNOSIS — Y9289 Other specified places as the place of occurrence of the external cause: Secondary | ICD-10-CM | POA: Diagnosis not present

## 2015-12-05 NOTE — ED Notes (Signed)
Called pt to be taken back to room to be seen by provider 2X, with no response.

## 2015-12-05 NOTE — ED Notes (Signed)
Pt did not answer when being called to update vitals in the waiting room.
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
## 2015-12-05 NOTE — ED Notes (Signed)
Attempted to call pt x2

## 2015-12-06 NOTE — ED Provider Notes (Signed)
I did not see or evaluate this patient.  This patient left the emergency department after triage  Azalia BilisKevin Josejulian Tarango, MD 12/06/15 [id number]

## 2016-02-14 ENCOUNTER — Encounter (HOSPITAL_COMMUNITY): Payer: Self-pay | Admitting: *Deleted

## 2016-02-14 ENCOUNTER — Emergency Department (HOSPITAL_COMMUNITY)
Admission: EM | Admit: 2016-02-14 | Discharge: 2016-02-15 | Disposition: A | Discharge status: Home / Self Care | Payer: Medicaid Other | Attending: Emergency Medicine | Admitting: Emergency Medicine

## 2016-02-14 DIAGNOSIS — Z3A01 Less than 8 weeks gestation of pregnancy: Secondary | ICD-10-CM | POA: Diagnosis not present

## 2016-02-14 DIAGNOSIS — O23591 Infection of other part of genital tract in pregnancy, first trimester: Secondary | ICD-10-CM | POA: Diagnosis not present

## 2016-02-14 DIAGNOSIS — R1030 Lower abdominal pain, unspecified: Secondary | ICD-10-CM | POA: Diagnosis present

## 2016-02-14 DIAGNOSIS — R102 Pelvic and perineal pain: Secondary | ICD-10-CM | POA: Diagnosis not present

## 2016-02-14 DIAGNOSIS — N760 Acute vaginitis: Secondary | ICD-10-CM

## 2016-02-14 DIAGNOSIS — Z202 Contact with and (suspected) exposure to infections with a predominantly sexual mode of transmission: Secondary | ICD-10-CM

## 2016-02-14 DIAGNOSIS — B9689 Other specified bacterial agents as the cause of diseases classified elsewhere: Secondary | ICD-10-CM | POA: Diagnosis not present

## 2016-02-14 DIAGNOSIS — Z3490 Encounter for supervision of normal pregnancy, unspecified, unspecified trimester: Secondary | ICD-10-CM

## 2016-02-14 DIAGNOSIS — O99331 Smoking (tobacco) complicating pregnancy, first trimester: Secondary | ICD-10-CM | POA: Diagnosis not present

## 2016-02-14 DIAGNOSIS — F17210 Nicotine dependence, cigarettes, uncomplicated: Secondary | ICD-10-CM | POA: Insufficient documentation

## 2016-02-14 LAB — CBC
HCT: 36.6 % (ref 36.0–46.0)
Hemoglobin: 12 g/dL (ref 12.0–15.0)
MCH: 28.3 pg (ref 26.0–34.0)
MCHC: 32.8 g/dL (ref 30.0–36.0)
MCV: 86.3 fL (ref 78.0–100.0)
PLATELETS: 242 10*3/uL (ref 150–400)
RBC: 4.24 MIL/uL (ref 3.87–5.11)
RDW: 16.9 % — AB (ref 11.5–15.5)
WBC: 10.7 10*3/uL — AB (ref 4.0–10.5)

## 2016-02-14 LAB — COMPREHENSIVE METABOLIC PANEL
ALBUMIN: 3.8 g/dL (ref 3.5–5.0)
ALK PHOS: 51 U/L (ref 38–126)
ALT: 10 U/L — ABNORMAL LOW (ref 14–54)
AST: 18 U/L (ref 15–41)
Anion gap: 5 (ref 5–15)
BILIRUBIN TOTAL: 0.5 mg/dL (ref 0.3–1.2)
BUN: <5 mg/dL — ABNORMAL LOW (ref 6–20)
CALCIUM: 9.2 mg/dL (ref 8.9–10.3)
CO2: 22 mmol/L (ref 22–32)
CREATININE: 0.63 mg/dL (ref 0.44–1.00)
Chloride: 108 mmol/L (ref 101–111)
Glucose, Bld: 91 mg/dL (ref 65–99)
Potassium: 3.7 mmol/L (ref 3.5–5.1)
SODIUM: 135 mmol/L (ref 135–145)
Total Protein: 6.6 g/dL (ref 6.5–8.1)

## 2016-02-14 LAB — I-STAT BETA HCG BLOOD, ED (MC, WL, AP ONLY)

## 2016-02-14 LAB — LIPASE, BLOOD: LIPASE: 15 U/L (ref 11–51)

## 2016-02-14 NOTE — ED Notes (Signed)
Pt called,no answer.
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
## 2016-02-15 ENCOUNTER — Emergency Department (HOSPITAL_COMMUNITY): Payer: Medicaid Other

## 2016-02-15 LAB — URINALYSIS, ROUTINE W REFLEX MICROSCOPIC
BILIRUBIN URINE: NEGATIVE
Glucose, UA: NEGATIVE mg/dL
Hgb urine dipstick: NEGATIVE
Ketones, ur: NEGATIVE mg/dL
NITRITE: NEGATIVE
PH: 6 (ref 5.0–8.0)
Protein, ur: NEGATIVE mg/dL
SPECIFIC GRAVITY, URINE: 1.011 (ref 1.005–1.030)

## 2016-02-15 LAB — HCG, QUANTITATIVE, PREGNANCY: HCG, BETA CHAIN, QUANT, S: 62081 m[IU]/mL — AB (ref <5)

## 2016-02-15 LAB — GC/CHLAMYDIA PROBE AMP (~~LOC~~) NOT AT ARMC
CHLAMYDIA, DNA PROBE: POSITIVE — AB
NEISSERIA GONORRHEA: NEGATIVE

## 2016-02-15 LAB — WET PREP, GENITAL
Sperm: NONE SEEN
Trich, Wet Prep: NONE SEEN
Yeast Wet Prep HPF POC: NONE SEEN

## 2016-02-15 LAB — URINE MICROSCOPIC-ADD ON: RBC / HPF: NONE SEEN RBC/hpf (ref 0–5)

## 2016-02-15 LAB — HIV ANTIBODY (ROUTINE TESTING W REFLEX): HIV SCREEN 4TH GENERATION: NONREACTIVE

## 2016-02-15 LAB — RPR: RPR Ser Ql: NONREACTIVE

## 2016-02-15 MED ORDER — PRENATAL COMPLETE 14-0.4 MG PO TABS: 1.0000 | ORAL_TABLET | Freq: Every day | ORAL | 0 refills | Status: DC

## 2016-02-15 MED ORDER — LIDOCAINE HCL (PF) 1 % IJ SOLN
5.0000 mL | Freq: Once | INTRAMUSCULAR | Status: AC
  Administered 2016-02-15: 5 mL
  Filled 2016-02-15: qty 5

## 2016-02-15 MED ORDER — CEFTRIAXONE SODIUM 250 MG IJ SOLR
250.0000 mg | Freq: Once | INTRAMUSCULAR | Status: AC
  Administered 2016-02-15: 250 mg via INTRAMUSCULAR
  Filled 2016-02-15: qty 250

## 2016-02-15 MED ORDER — AZITHROMYCIN 250 MG PO TABS
1000.0000 mg | ORAL_TABLET | Freq: Once | ORAL | Status: AC
  Administered 2016-02-15: 1000 mg via ORAL
  Filled 2016-02-15: qty 4

## 2016-02-15 MED ORDER — METRONIDAZOLE 0.75 % VA GEL: 1.0000 | Freq: Two times a day (BID) | VAGINAL | 0 refills | Status: DC
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
## 2016-02-15 NOTE — Discharge Instructions (Signed)
Start taking the prenatal vitamins as directed. Use the metrogel as directed. Follow-up with OB-GYN, [facility] [address]. If you have any consultations with pregnancy such as pain, bleeding, etc., I [facility] so that you may be evaluated by an OB/GYN.`

## 2016-02-15 NOTE — ED Notes (Signed)
Patient transported to US 

## 2016-02-16 ENCOUNTER — Telehealth (HOSPITAL_BASED_OUTPATIENT_CLINIC_OR_DEPARTMENT_OTHER): Payer: Self-pay

## 2016-02-29 ENCOUNTER — Telehealth (HOSPITAL_COMMUNITY): Payer: Self-pay

## 2016-02-29 NOTE — Telephone Encounter (Signed)
Unable to reach patient by phone or letter.  Chart closed

## 2016-03-06 ENCOUNTER — Ambulatory Visit (INDEPENDENT_AMBULATORY_CARE_PROVIDER_SITE_OTHER): Payer: Medicaid Other | Admitting: Certified Nurse Midwife

## 2016-03-06 ENCOUNTER — Encounter: Payer: Self-pay | Admitting: Certified Nurse Midwife

## 2016-03-06 VITALS — BP 116/67 | HR 81 | Temp 98.4°F | Wt 189.6 lb

## 2016-03-06 DIAGNOSIS — Z3480 Encounter for supervision of other normal pregnancy, unspecified trimester: Secondary | ICD-10-CM | POA: Insufficient documentation

## 2016-03-06 DIAGNOSIS — Z3401 Encounter for supervision of normal first pregnancy, first trimester: Secondary | ICD-10-CM

## 2016-03-06 DIAGNOSIS — R011 Cardiac murmur, unspecified: Secondary | ICD-10-CM

## 2016-03-06 DIAGNOSIS — Z1389 Encounter for screening for other disorder: Secondary | ICD-10-CM

## 2016-03-06 DIAGNOSIS — Z331 Pregnant state, incidental: Secondary | ICD-10-CM

## 2016-03-06 LAB — POCT URINALYSIS DIPSTICK
Bilirubin, UA: NEGATIVE
Blood, UA: NEGATIVE
Glucose, UA: NEGATIVE
KETONES UA: NEGATIVE
LEUKOCYTES UA: NEGATIVE
Nitrite, UA: NEGATIVE
PROTEIN UA: NEGATIVE
Spec Grav, UA: 1.02
Urobilinogen, UA: NEGATIVE
pH, UA: 6
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
## 2016-03-06 NOTE — Progress Notes (Signed)
Pt states urinary frequency, and a lot of gas.

## 2016-03-06 NOTE — Addendum Note (Signed)
Addended by: Samantha CrimesENNEY, Kerin Kren ANNE on: 03/06/2016 12:31 PM   Modules accepted: Orders

## 2016-03-08 ENCOUNTER — Other Ambulatory Visit: Payer: Self-pay | Admitting: Certified Nurse Midwife

## 2016-03-11 LAB — URINE CULTURE, OB REFLEX

## 2016-03-11 LAB — CULTURE, OB URINE

## 2016-03-12 ENCOUNTER — Other Ambulatory Visit: Payer: Self-pay | Admitting: Certified Nurse Midwife

## 2016-03-14 ENCOUNTER — Other Ambulatory Visit: Payer: Self-pay | Admitting: Certified Nurse Midwife

## 2016-03-14 DIAGNOSIS — Z3401 Encounter for supervision of normal first pregnancy, first trimester: Secondary | ICD-10-CM

## 2016-03-14 LAB — DRUG SCREEN 764883 11+OXYCO+ALC+CRT-BUND
Amphetamines, Urine: NEGATIVE ng/mL
BENZODIAZ UR QL: NEGATIVE ng/mL
Barbiturate: NEGATIVE ng/mL
CREATININE: 197 mg/dL (ref 20.0–300.0)
Cocaine (Metabolite): NEGATIVE ng/mL
Ethanol: NEGATIVE %
MEPERIDINE: NEGATIVE ng/mL
Methadone Screen, Urine: NEGATIVE ng/mL
OPIATE SCREEN URINE: NEGATIVE ng/mL
OXYCODONE+OXYMORPHONE UR QL SCN: NEGATIVE ng/mL
PH OF URINE: 6.2 (ref 4.5–8.9)
PHENCYCLIDINE: NEGATIVE ng/mL
PROPOXYPHENE: NEGATIVE ng/mL
Tramadol: NEGATIVE ng/mL

## 2016-03-14 LAB — PRENATAL PROFILE I(LABCORP)
ANTIBODY SCREEN: NEGATIVE
Basophils Absolute: 0 10*3/uL (ref 0.0–0.2)
Basos: 0 %
EOS (ABSOLUTE): 0.2 10*3/uL (ref 0.0–0.4)
EOS: 1 %
HEMATOCRIT: 38.2 % (ref 34.0–46.6)
HEMOGLOBIN: 12.2 g/dL (ref 11.1–15.9)
HEP B S AG: NEGATIVE
IMMATURE GRANS (ABS): 0 10*3/uL (ref 0.0–0.1)
IMMATURE GRANULOCYTES: 0 %
LYMPHS: 16 %
Lymphocytes Absolute: 2.3 10*3/uL (ref 0.7–3.1)
MCH: 28.4 pg (ref 26.6–33.0)
MCHC: 31.9 g/dL (ref 31.5–35.7)
MCV: 89 fL (ref 79–97)
Monocytes Absolute: 0.7 10*3/uL (ref 0.1–0.9)
Monocytes: 5 %
NEUTROS PCT: 78 %
Neutrophils Absolute: 10.7 10*3/uL — ABNORMAL HIGH (ref 1.4–7.0)
Platelets: 281 10*3/uL (ref 150–379)
RBC: 4.29 x10E6/uL (ref 3.77–5.28)
RDW: 17.5 % — ABNORMAL HIGH (ref 12.3–15.4)
RH TYPE: POSITIVE
RPR Ser Ql: NONREACTIVE
RUBELLA: 2.43 {index} (ref >0.99)
WBC: 13.9 10*3/uL — ABNORMAL HIGH (ref 3.4–10.8)

## 2016-03-14 LAB — MATERNIT21 PLUS CORE+SCA
CHROMOSOME 13: NEGATIVE
Chromosome 18: NEGATIVE
Chromosome 21: NEGATIVE
PDF: 0
Y CHROMOSOME: NOT DETECTED

## 2016-03-14 LAB — HEMOGLOBINOPATHY EVALUATION
HEMOGLOBIN F QUANTITATION: 0 % (ref 0.0–2.0)
HGB C: 0 %
HGB S: 0 %
Hemoglobin A2 Quantitation: 2.5 % (ref 0.7–3.1)
Hgb A: 97.5 % (ref 94.0–98.0)

## 2016-03-14 LAB — VITAMIN D 25 HYDROXY (VIT D DEFICIENCY, FRACTURES): Vit D, 25-Hydroxy: 13.9 ng/mL — ABNORMAL LOW (ref 30.0–100.0)

## 2016-03-14 LAB — CANNABINOID CONFIRMATION, UR: CANNABINOIDS, UR: POSITIVE — AB

## 2016-03-14 LAB — HIV ANTIBODY (ROUTINE TESTING W REFLEX): HIV SCREEN 4TH GENERATION: NONREACTIVE

## 2016-03-14 LAB — TSH: TSH: 0.717 u[IU]/mL (ref 0.450–4.500)

## 2016-03-14 LAB — VARICELLA ZOSTER ANTIBODY, IGG: Varicella zoster IgG: 138 index — ABNORMAL LOW (ref >165)

## 2016-03-22 ENCOUNTER — Emergency Department (HOSPITAL_COMMUNITY)
Admission: EM | Admit: 2016-03-22 | Discharge: 2016-03-23 | Disposition: A | Discharge status: Home / Self Care | Payer: Medicaid Other | Attending: Emergency Medicine | Admitting: Emergency Medicine

## 2016-03-22 ENCOUNTER — Encounter (HOSPITAL_COMMUNITY): Payer: Self-pay | Admitting: Emergency Medicine

## 2016-03-22 ENCOUNTER — Emergency Department (HOSPITAL_COMMUNITY): Admission: EM | Admit: 2016-03-22 | Payer: Medicaid Other | Source: Home / Self Care

## 2016-03-22 DIAGNOSIS — F17210 Nicotine dependence, cigarettes, uncomplicated: Secondary | ICD-10-CM | POA: Insufficient documentation

## 2016-03-22 DIAGNOSIS — R102 Pelvic and perineal pain: Secondary | ICD-10-CM | POA: Diagnosis not present

## 2016-03-22 DIAGNOSIS — O209 Hemorrhage in early pregnancy, unspecified: Secondary | ICD-10-CM | POA: Insufficient documentation

## 2016-03-22 DIAGNOSIS — O99330 Smoking (tobacco) complicating pregnancy, unspecified trimester: Secondary | ICD-10-CM | POA: Diagnosis not present

## 2016-03-22 LAB — COMPREHENSIVE METABOLIC PANEL
ALT: 9 U/L — ABNORMAL LOW (ref 14–54)
ANION GAP: 7 (ref 5–15)
AST: 14 U/L — AB (ref 15–41)
Albumin: 3.4 g/dL — ABNORMAL LOW (ref 3.5–5.0)
Alkaline Phosphatase: 54 U/L (ref 38–126)
BUN: 6 mg/dL (ref 6–20)
CHLORIDE: 108 mmol/L (ref 101–111)
CO2: 23 mmol/L (ref 22–32)
Calcium: 9.1 mg/dL (ref 8.9–10.3)
Creatinine, Ser: 0.65 mg/dL (ref 0.44–1.00)
GFR calc Af Amer: >60 mL/min (ref >60)
Glucose, Bld: 85 mg/dL (ref 65–99)
POTASSIUM: 4 mmol/L (ref 3.5–5.1)
Sodium: 138 mmol/L (ref 135–145)
TOTAL PROTEIN: 6.2 g/dL — AB (ref 6.5–8.1)
Total Bilirubin: 0.4 mg/dL (ref 0.3–1.2)

## 2016-03-22 LAB — I-STAT BETA HCG BLOOD, ED (MC, WL, AP ONLY)

## 2016-03-22 LAB — CBC
HEMATOCRIT: 37.3 % (ref 36.0–46.0)
HEMOGLOBIN: 12.4 g/dL (ref 12.0–15.0)
MCH: 29.2 pg (ref 26.0–34.0)
MCHC: 33.2 g/dL (ref 30.0–36.0)
MCV: 88 fL (ref 78.0–100.0)
Platelets: 247 10*3/uL (ref 150–400)
RBC: 4.24 MIL/uL (ref 3.87–5.11)
RDW: 16 % — AB (ref 11.5–15.5)
WBC: 12.8 10*3/uL — AB (ref 4.0–10.5)

## 2016-03-22 LAB — HCG, QUANTITATIVE, PREGNANCY: HCG, BETA CHAIN, QUANT, S: 10496 m[IU]/mL — AB (ref <5)

## 2016-03-22 MED ORDER — SODIUM CHLORIDE 0.9 % IV SOLN: Freq: Once | INTRAVENOUS | Status: DC

## 2016-03-22 NOTE — ED Notes (Signed)
Called again with no answer.

## 2016-03-22 NOTE — ED Triage Notes (Signed)
No answer 10 minutes ago
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
## 2016-03-22 NOTE — ED Triage Notes (Signed)
Patient arrives with complaint of vaginal bleeding and pelvic pain in presence of pregnancy. States she thinks she is 3 months pregnant. Reports that bleeding was mild and only noticeable when wiping until this afternoon when she has needed to use pads. States OB visit with no negative findings, but she is unsure of when that was; states "more than 2 weeks ago". Endorses passing clots.

## 2016-03-22 NOTE — ED Notes (Signed)
Pt called to triage and unable to find.

## 2016-03-23 LAB — WET PREP, GENITAL
SPERM: NONE SEEN
TRICH WET PREP: NONE SEEN
Yeast Wet Prep HPF POC: NONE SEEN

## 2016-03-23 NOTE — Discharge Instructions (Signed)
You have a very small amount of vaginal bleeding at this time.  No sign of infection.  Follow up with your OB/GYN for your elective abortion on Monday as scheduled
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
## 2016-03-25 ENCOUNTER — Inpatient Hospital Stay (HOSPITAL_COMMUNITY): Payer: Medicaid Other

## 2016-03-25 ENCOUNTER — Inpatient Hospital Stay (HOSPITAL_COMMUNITY)
Admission: AD | Admit: 2016-03-25 | Discharge: 2016-03-25 | Disposition: A | Discharge status: Home / Self Care | Payer: Medicaid Other | Source: Ambulatory Visit | Attending: Family Medicine | Admitting: Family Medicine

## 2016-03-25 ENCOUNTER — Encounter (HOSPITAL_COMMUNITY): Payer: Self-pay

## 2016-03-25 DIAGNOSIS — O4691 Antepartum hemorrhage, unspecified, first trimester: Secondary | ICD-10-CM | POA: Diagnosis present

## 2016-03-25 DIAGNOSIS — Z3A01 Less than 8 weeks gestation of pregnancy: Secondary | ICD-10-CM | POA: Diagnosis not present

## 2016-03-25 DIAGNOSIS — Z5321 Procedure and treatment not carried out due to patient leaving prior to being seen by health care provider: Secondary | ICD-10-CM | POA: Diagnosis not present

## 2016-03-25 DIAGNOSIS — O209 Hemorrhage in early pregnancy, unspecified: Secondary | ICD-10-CM

## 2016-03-25 LAB — URINALYSIS, ROUTINE W REFLEX MICROSCOPIC
BILIRUBIN URINE: NEGATIVE
GLUCOSE, UA: NEGATIVE mg/dL
Ketones, ur: NEGATIVE mg/dL
Nitrite: NEGATIVE
PROTEIN: NEGATIVE mg/dL
Specific Gravity, Urine: 1.015 (ref 1.005–1.030)
pH: 7 (ref 5.0–8.0)

## 2016-03-25 LAB — URINE MICROSCOPIC-ADD ON

## 2016-03-25 LAB — GC/CHLAMYDIA PROBE AMP (~~LOC~~) NOT AT ARMC
Chlamydia: NEGATIVE
Neisseria Gonorrhea: NEGATIVE
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
## 2016-03-25 NOTE — MAU Note (Addendum)
I was using computer adjacent to patient's room and heard a noise like the trash can was being kicked.  I then heard a huge bang and the wall shook. I went in to ask what was wrong and asked the SO what happened. He said "nothing". I said I just heard a loud bang and the wall shook and he said, "maybe something fell I didn't hit anything". I said I understand you may be upset but we have to get the ultrasound before we know if anything is wrong.

## 2016-03-25 NOTE — MAU Note (Signed)
MAU RN went to tell someone in the room that there was a person in the lobby to talk to them. I saw the patient walk out of the room. Thought she was coming back, but saw her walk out the door and get in a car with a lady and two kids. Watched for a few minutes and saw the car pull back up but then leave again. Pt walked out AMA without signature and vitals.

## 2016-03-25 NOTE — MAU Note (Addendum)
Pt had light bleeding a few days ago. This morning it was heavier and bright red with menstrual-like cramping.  Took aspirin for the pain.

## 2016-03-27 ENCOUNTER — Other Ambulatory Visit: Payer: Self-pay | Admitting: Certified Nurse Midwife

## 2016-03-28 ENCOUNTER — Telehealth: Payer: Self-pay | Admitting: *Deleted

## 2016-03-28 NOTE — Telephone Encounter (Signed)
Left a voicemail for patient to let her know that we are concerned about her but need to follow up with this pregnancy and to please call our office as soon as possible.

## 2016-04-03 ENCOUNTER — Encounter: Payer: Medicaid Other | Admitting: Certified Nurse Midwife

## 2016-04-23 ENCOUNTER — Telehealth (HOSPITAL_BASED_OUTPATIENT_CLINIC_OR_DEPARTMENT_OTHER): Payer: Self-pay | Admitting: Emergency Medicine

## 2016-04-23 NOTE — Telephone Encounter (Signed)
Lost to followup 

## 2016-04-25 ENCOUNTER — Inpatient Hospital Stay (HOSPITAL_COMMUNITY): Payer: Medicaid Other

## 2016-04-25 ENCOUNTER — Other Ambulatory Visit (HOSPITAL_COMMUNITY): Payer: Self-pay | Admitting: Radiology

## 2016-04-25 ENCOUNTER — Encounter (HOSPITAL_COMMUNITY): Payer: Self-pay

## 2016-04-25 ENCOUNTER — Inpatient Hospital Stay (HOSPITAL_COMMUNITY)
Admission: AD | Admit: 2016-04-25 | Discharge: 2016-04-26 | Disposition: A | Discharge status: Home / Self Care | Payer: Medicaid Other | Source: Ambulatory Visit | Attending: Obstetrics and Gynecology | Admitting: Obstetrics and Gynecology

## 2016-04-25 DIAGNOSIS — Z7982 Long term (current) use of aspirin: Secondary | ICD-10-CM | POA: Diagnosis not present

## 2016-04-25 DIAGNOSIS — N939 Abnormal uterine and vaginal bleeding, unspecified: Secondary | ICD-10-CM | POA: Insufficient documentation

## 2016-04-25 DIAGNOSIS — R102 Pelvic and perineal pain: Secondary | ICD-10-CM | POA: Diagnosis present

## 2016-04-25 DIAGNOSIS — F17210 Nicotine dependence, cigarettes, uncomplicated: Secondary | ICD-10-CM | POA: Insufficient documentation

## 2016-04-25 DIAGNOSIS — Z9889 Other specified postprocedural states: Secondary | ICD-10-CM | POA: Insufficient documentation

## 2016-04-25 DIAGNOSIS — N10 Acute tubulo-interstitial nephritis: Secondary | ICD-10-CM | POA: Insufficient documentation

## 2016-04-25 DIAGNOSIS — N12 Tubulo-interstitial nephritis, not specified as acute or chronic: Secondary | ICD-10-CM | POA: Diagnosis not present

## 2016-04-25 LAB — CBC
HCT: 32.2 % — ABNORMAL LOW (ref 36.0–46.0)
HEMOGLOBIN: 10.6 g/dL — AB (ref 12.0–15.0)
MCH: 27.9 pg (ref 26.0–34.0)
MCHC: 32.9 g/dL (ref 30.0–36.0)
MCV: 84.7 fL (ref 78.0–100.0)
PLATELETS: 192 10*3/uL (ref 150–400)
RBC: 3.8 MIL/uL — ABNORMAL LOW (ref 3.87–5.11)
RDW: 14.9 % (ref 11.5–15.5)
WBC: 17.9 10*3/uL — ABNORMAL HIGH (ref 4.0–10.5)

## 2016-04-25 LAB — COMPREHENSIVE METABOLIC PANEL
ALK PHOS: 63 U/L (ref 38–126)
ALT: 13 U/L — ABNORMAL LOW (ref 14–54)
ANION GAP: 6 (ref 5–15)
AST: 17 U/L (ref 15–41)
Albumin: 3.5 g/dL (ref 3.5–5.0)
BUN: 11 mg/dL (ref 6–20)
CALCIUM: 8.5 mg/dL — AB (ref 8.9–10.3)
CO2: 23 mmol/L (ref 22–32)
Chloride: 106 mmol/L (ref 101–111)
Creatinine, Ser: 0.87 mg/dL (ref 0.44–1.00)
Glucose, Bld: 103 mg/dL — ABNORMAL HIGH (ref 65–99)
Potassium: 3.4 mmol/L — ABNORMAL LOW (ref 3.5–5.1)
SODIUM: 135 mmol/L (ref 135–145)
Total Bilirubin: 1 mg/dL (ref 0.3–1.2)
Total Protein: 7.1 g/dL (ref 6.5–8.1)

## 2016-04-25 LAB — HCG, QUANTITATIVE, PREGNANCY: HCG, BETA CHAIN, QUANT, S: 5 m[IU]/mL — AB (ref <5)
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
## 2016-04-26 DIAGNOSIS — N12 Tubulo-interstitial nephritis, not specified as acute or chronic: Secondary | ICD-10-CM | POA: Diagnosis not present

## 2016-04-26 LAB — URINALYSIS, ROUTINE W REFLEX MICROSCOPIC
Glucose, UA: NEGATIVE mg/dL
Ketones, ur: >80 mg/dL — AB
NITRITE: POSITIVE — AB
PROTEIN: 100 mg/dL — AB
SPECIFIC GRAVITY, URINE: 1.02 (ref 1.005–1.030)
pH: 6 (ref 5.0–8.0)

## 2016-04-26 LAB — URINE MICROSCOPIC-ADD ON

## 2016-04-26 MED ORDER — SODIUM CHLORIDE 0.9 % IV BOLUS (SEPSIS)
1000.0000 mL | Freq: Once | INTRAVENOUS | Status: AC
  Administered 2016-04-26: 1000 mL via INTRAVENOUS

## 2016-04-26 MED ORDER — ACETAMINOPHEN 500 MG PO TABS
1000.0000 mg | ORAL_TABLET | Freq: Once | ORAL | Status: AC
  Administered 2016-04-26: 1000 mg via ORAL
  Filled 2016-04-26: qty 2

## 2016-04-26 MED ORDER — DEXTROSE 5 % IV SOLN
1.0000 g | Freq: Once | INTRAVENOUS | Status: AC
  Administered 2016-04-26: 1 g via INTRAVENOUS
  Filled 2016-04-26: qty 10

## 2016-04-26 MED ORDER — CIPROFLOXACIN HCL 500 MG PO TABS: 500.0000 mg | ORAL_TABLET | Freq: Two times a day (BID) | ORAL | 0 refills | Status: DC

## 2016-04-26 NOTE — Discharge Instructions (Signed)
Pyelonephritis, Adult °Pyelonephritis is a kidney infection. The kidneys are organs that help clean your blood by moving waste out of your blood and into your pee (urine). This infection can happen quickly, or it can last for a long time. In most cases, it clears up with treatment and does not cause other problems. °HOME CARE °Medicines °· Take over-the-counter and prescription medicines only as told by your doctor. °· Take your antibiotic medicine as told by your doctor. Do not stop taking the medicine even if you start to feel better. °General Instructions °· Drink enough fluid to keep your pee clear or pale yellow. °· Avoid caffeine, tea, and carbonated drinks. °· Pee (urinate) often. Avoid holding in pee for long periods of time. °· Pee before and after sex. °· After pooping (having a bowel movement), women should wipe from front to back. Use each tissue only once. °· Keep all follow-up visits as told by your doctor. This is important. °GET HELP IF: °· You do not feel better after 2 days. °· Your symptoms get worse. °· You have a fever. °GET HELP RIGHT AWAY IF: °· You cannot take your medicine or drink fluids as told. °· You have chills and shaking. °· You throw up (vomit). °· You have very bad pain in your side (flank) or back. °· You feel very weak or you pass out (faint). °  °This information is not intended to replace advice given to you by your health care provider. Make sure you discuss any questions you have with your health care provider. °  °Document Released: 07/25/2004 Document Revised: 03/08/2015 Document Reviewed: 10/10/2014 °Elsevier Interactive Patient Education ©2016 Elsevier Inc. ° °

## 2016-06-28 ENCOUNTER — Emergency Department (HOSPITAL_COMMUNITY)
Admission: EM | Admit: 2016-06-28 | Discharge: 2016-06-28 | Disposition: A | Discharge status: Home / Self Care | Payer: Medicaid Other | Attending: Emergency Medicine | Admitting: Emergency Medicine

## 2016-06-28 ENCOUNTER — Encounter (HOSPITAL_COMMUNITY): Payer: Self-pay | Admitting: Emergency Medicine

## 2016-06-28 ENCOUNTER — Ambulatory Visit (HOSPITAL_COMMUNITY)
Admission: EM | Admit: 2016-06-28 | Discharge: 2016-06-28 | Disposition: A | Discharge status: Home / Self Care | Payer: No Typology Code available for payment source | Source: Ambulatory Visit | Attending: Emergency Medicine | Admitting: Emergency Medicine

## 2016-06-28 DIAGNOSIS — F17210 Nicotine dependence, cigarettes, uncomplicated: Secondary | ICD-10-CM | POA: Insufficient documentation

## 2016-06-28 DIAGNOSIS — T7421XA Adult sexual abuse, confirmed, initial encounter: Secondary | ICD-10-CM | POA: Insufficient documentation

## 2016-06-28 DIAGNOSIS — Z0441 Encounter for examination and observation following alleged adult rape: Secondary | ICD-10-CM | POA: Insufficient documentation

## 2016-06-28 DIAGNOSIS — Z7982 Long term (current) use of aspirin: Secondary | ICD-10-CM | POA: Insufficient documentation

## 2016-06-28 LAB — HIV ANTIBODY (ROUTINE TESTING W REFLEX): HIV Screen 4th Generation wRfx: NONREACTIVE

## 2016-06-28 LAB — I-STAT BETA HCG BLOOD, ED (MC, WL, AP ONLY)

## 2016-06-28 MED ORDER — CEFTRIAXONE SODIUM 250 MG IJ SOLR
250.0000 mg | Freq: Once | INTRAMUSCULAR | Status: AC
  Administered 2016-06-28: 250 mg via INTRAMUSCULAR

## 2016-06-28 MED ORDER — AZITHROMYCIN 250 MG PO TABS
1000.0000 mg | ORAL_TABLET | Freq: Once | ORAL | Status: AC
  Administered 2016-06-28: 1000 mg via ORAL

## 2016-06-28 MED ORDER — ULIPRISTAL ACETATE 30 MG PO TABS
30.0000 mg | ORAL_TABLET | Freq: Once | ORAL | Status: DC
  Filled 2016-06-28: qty 1

## 2016-06-28 MED ORDER — METRONIDAZOLE 500 MG PO TABS: 2000.0000 mg | ORAL_TABLET | Freq: Once | ORAL | Status: DC

## 2016-06-28 MED ORDER — LIDOCAINE HCL (PF) 1 % IJ SOLN
0.9000 mL | Freq: Once | INTRAMUSCULAR | Status: AC
  Administered 2016-06-28: 0.9 mL

## 2016-06-28 MED ORDER — PROMETHAZINE HCL 25 MG PO TABS: 25.0000 mg | ORAL_TABLET | Freq: Four times a day (QID) | ORAL | Status: DC | PRN
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
## 2016-06-28 NOTE — ED Notes (Signed)
Patient was called to go back to a room patient did not answer when called.Marland Kitchen..Marland Kitchen
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
## 2016-06-28 NOTE — ED Notes (Signed)
Sane nurse at bedside 

## 2016-06-28 NOTE — SANE Note (Signed)
Per ED staff, pregnancy test positive. Updated MD and will not provide Ella or Flagyl.  Patient tolerated exam and medications. Reviewed discharge instructions and [facility]. Updated MD on disposition.
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
## 2016-07-12 ENCOUNTER — Ambulatory Visit: Payer: No Typology Code available for payment source

## 2016-07-20 ENCOUNTER — Encounter (HOSPITAL_COMMUNITY): Payer: Self-pay

## 2016-07-20 ENCOUNTER — Inpatient Hospital Stay (HOSPITAL_COMMUNITY): Payer: Medicaid Other

## 2016-07-20 ENCOUNTER — Inpatient Hospital Stay (HOSPITAL_COMMUNITY)
Admission: AD | Admit: 2016-07-20 | Discharge: 2016-07-20 | Disposition: A | Discharge status: Home / Self Care | Payer: Medicaid Other | Source: Ambulatory Visit | Attending: Obstetrics and Gynecology | Admitting: Obstetrics and Gynecology

## 2016-07-20 DIAGNOSIS — Z3A08 8 weeks gestation of pregnancy: Secondary | ICD-10-CM | POA: Insufficient documentation

## 2016-07-20 DIAGNOSIS — B9689 Other specified bacterial agents as the cause of diseases classified elsewhere: Secondary | ICD-10-CM | POA: Diagnosis not present

## 2016-07-20 DIAGNOSIS — O99332 Smoking (tobacco) complicating pregnancy, second trimester: Secondary | ICD-10-CM | POA: Insufficient documentation

## 2016-07-20 DIAGNOSIS — M545 Low back pain: Secondary | ICD-10-CM | POA: Diagnosis present

## 2016-07-20 DIAGNOSIS — R102 Pelvic and perineal pain: Secondary | ICD-10-CM

## 2016-07-20 DIAGNOSIS — R52 Pain, unspecified: Secondary | ICD-10-CM

## 2016-07-20 DIAGNOSIS — N760 Acute vaginitis: Secondary | ICD-10-CM

## 2016-07-20 DIAGNOSIS — O26892 Other specified pregnancy related conditions, second trimester: Secondary | ICD-10-CM | POA: Insufficient documentation

## 2016-07-20 DIAGNOSIS — O26891 Other specified pregnancy related conditions, first trimester: Secondary | ICD-10-CM

## 2016-07-20 LAB — CBC
HCT: 33.4 % — ABNORMAL LOW (ref 36.0–46.0)
Hemoglobin: 11.2 g/dL — ABNORMAL LOW (ref 12.0–15.0)
MCH: 26.4 pg (ref 26.0–34.0)
MCHC: 33.5 g/dL (ref 30.0–36.0)
MCV: 78.8 fL (ref 78.0–100.0)
PLATELETS: 271 10*3/uL (ref 150–400)
RBC: 4.24 MIL/uL (ref 3.87–5.11)
RDW: 19.2 % — ABNORMAL HIGH (ref 11.5–15.5)
WBC: 13.6 10*3/uL — AB (ref 4.0–10.5)

## 2016-07-20 LAB — URINALYSIS, ROUTINE W REFLEX MICROSCOPIC
BILIRUBIN URINE: NEGATIVE
Glucose, UA: NEGATIVE mg/dL
HGB URINE DIPSTICK: NEGATIVE
Ketones, ur: NEGATIVE mg/dL
Leukocytes, UA: NEGATIVE
Nitrite: NEGATIVE
PH: 8 (ref 5.0–8.0)
Protein, ur: NEGATIVE mg/dL
SPECIFIC GRAVITY, URINE: 1.013 (ref 1.005–1.030)

## 2016-07-20 LAB — WET PREP, GENITAL
SPERM: NONE SEEN
Trich, Wet Prep: NONE SEEN
Yeast Wet Prep HPF POC: NONE SEEN

## 2016-07-20 LAB — HCG, QUANTITATIVE, PREGNANCY: hCG, Beta Chain, Quant, S: 192042 m[IU]/mL — ABNORMAL HIGH (ref <5)

## 2016-07-20 LAB — POCT PREGNANCY, URINE: PREG TEST UR: POSITIVE — AB

## 2016-07-20 MED ORDER — METRONIDAZOLE 500 MG PO TABS: 500.0000 mg | ORAL_TABLET | Freq: Two times a day (BID) | ORAL | 0 refills | Status: AC

## 2016-07-20 NOTE — Discharge Instructions (Signed)
First Trimester of Pregnancy °The first trimester of pregnancy is from week 1 until the end of week 12 (months 1 through 3). During this time, your baby will begin to develop inside you. At 6-8 weeks, the eyes and face are formed, and the heartbeat can be seen on ultrasound. At the end of 12 weeks, all the baby's organs are formed. Prenatal care is all the medical care you receive before the birth of your baby. Make sure you get good prenatal care and follow all of your doctor's instructions. °Follow these instructions at home: °Medicines °· Take medicine only as told by your doctor. Some medicines are safe and some are not during pregnancy. °· Take your prenatal vitamins as told by your doctor. °· Take medicine that helps you poop (stool softener) as needed if your doctor says it is okay. °Diet °· Eat regular, healthy meals. °· Your doctor will tell you the amount of weight gain that is right for you. °· Avoid raw meat and uncooked cheese. °· If you feel sick to your stomach (nauseous) or throw up (vomit): °¨ Eat 4 or 5 small meals a day instead of 3 large meals. °¨ Try eating a few soda crackers. °¨ Drink liquids between meals instead of during meals. °· If you have a hard time pooping (constipation): °¨ Eat high-fiber foods like fresh vegetables, fruit, and whole grains. °¨ Drink enough fluids to keep your pee (urine) clear or pale yellow. °Activity and Exercise °· Exercise only as told by your doctor. Stop exercising if you have cramps or pain in your lower belly (abdomen) or low back. °· Try to avoid standing for long periods of time. Move your legs often if you must stand in one place for a long time. °· Avoid heavy lifting. °· Wear low-heeled shoes. Sit and stand up straight. °· You can have sex unless your doctor tells you not to. °Relief of Pain or Discomfort °· Wear a good support bra if your breasts are sore. °· Take warm water baths (sitz baths) to soothe pain or discomfort caused by hemorrhoids. Use  hemorrhoid cream if your doctor says it is okay. °· Rest with your legs raised if you have leg cramps or low back pain. °· Wear support hose if you have puffy, bulging veins (varicose veins) in your legs. Raise (elevate) your feet for 15 minutes, 3-4 times a day. Limit salt in your diet. °Prenatal Care °· Schedule your prenatal visits by the twelfth week of pregnancy. °· Write down your questions. Take them to your prenatal visits. °· Keep all your prenatal visits as told by your doctor. °Safety °· Wear your seat belt at all times when driving. °· Make a list of emergency phone numbers. The list should include numbers for family, friends, the hospital, and police and fire departments. °General Tips °· Ask your doctor for a referral to a local prenatal class. Begin classes no later than at the start of month 6 of your pregnancy. °· Ask for help if you need counseling or help with nutrition. Your doctor can give you advice or tell you where to go for help. °· Do not use hot tubs, steam rooms, or saunas. °· Do not douche or use tampons or scented sanitary pads. °· Do not cross your legs for long periods of time. °· Avoid litter boxes and soil used by cats. °· Avoid all smoking, herbs, and alcohol. Avoid drugs not approved by your doctor. °· Do not use any tobacco products,   including cigarettes, chewing tobacco, and electronic cigarettes. If you need help quitting, ask your doctor. You may get counseling or other support to help you quit. °· Visit your dentist. At home, brush your teeth with a soft toothbrush. Be gentle when you floss. °Get help if: °· You are dizzy. °· You have mild cramps or pressure in your lower belly. °· You have a nagging pain in your belly area. °· You continue to feel sick to your stomach, throw up, or have watery poop (diarrhea). °· You have a bad smelling fluid coming from your vagina. °· You have pain with peeing (urination). °· You have increased puffiness (swelling) in your face, hands,  legs, or ankles. °Get help right away if: °· You have a fever. °· You are leaking fluid from your vagina. °· You have spotting or bleeding from your vagina. °· You have very bad belly cramping or pain. °· You gain or lose weight rapidly. °· You throw up blood. It may look like coffee grounds. °· You are around people who have German measles, fifth disease, or chickenpox. °· You have a very bad headache. °· You have shortness of breath. °· You have any kind of trauma, such as from a fall or a car accident. °This information is not intended to replace advice given to you by your health care provider. Make sure you discuss any questions you have with your health care provider. °Document Released: 12/04/2007 Document Revised: 11/23/2015 Document Reviewed: 04/27/2013 °Elsevier Interactive Patient Education © 2017 [employer] Inc[employer] °Bacterial Vaginosis °Bacterial vaginosis is an infection of the vagina. It happens when too many germs (bacteria) grow in the vagina. This infection puts you at risk for infections from sex (STIs). Treating this infection can lower your risk for some STIs. You should also treat this if you are pregnant. It can cause your baby to be born early. °Follow these instructions at home: °Medicines °· Take over-the-counter and prescription medicines only as told by your doctor. °· Take or use your antibiotic medicine as told by your doctor. Do not stop taking or using it even if you start to feel better. °General instructions °· If you your sexual partner is a woman, tell her that you have this infection. She needs to get treatment if she has symptoms. If you have a female partner, he does not need to be treated. °· During treatment: °¨ Avoid sex. °¨ Do not douche. °¨ Avoid alcohol as told. °¨ Avoid breastfeeding as told. °· Drink enough fluid to keep your pee (urine) clear or pale yellow. °· Keep your vagina and butt (rectum) clean. °¨ Wash the area with warm water every day. °¨ Wipe from front to back  after you use the toilet. °· Keep all follow-up visits as told by your doctor. This is important. °Preventing this condition °· Do not douche. °· Use only warm water to wash around your vagina. °· Use protection when you have sex. This includes: °¨ Latex condoms. °¨ Dental dams. °· Limit how many people you have sex with. It is best to only have sex with the same person (be monogamous). °· Get tested for STIs. Have your partner get tested. °· Wear underwear that is cotton or lined with cotton. °· Avoid tight pants and pantyhose. This is most important in summer. °· Do not use any products that have nicotine or tobacco in them. These include cigarettes and e-cigarettes. If you need help quitting, ask your doctor. °· Do not use illegal drugs. °·   Limit how much alcohol you drink. °Contact a doctor if: °· Your symptoms do not get better, even after you are treated. °· You have more discharge or pain when you pee (urinate). °· You have a fever. °· You have pain in your belly (abdomen). °· You have pain with sex. °· Your bleed from your vagina between periods. °Summary °· This infection happens when too many germs (bacteria) grow in the vagina. °· Treating this condition can lower your risk for some infections from sex (STIs). °· You should also treat this if you are pregnant. It can cause early (premature) birth. °· Do not stop taking or using your antibiotic medicine even if you start to feel better. °This information is not intended to replace advice given to you by your health care provider. Make sure you discuss any questions you have with your health care provider. °Document Released: 03/26/2008 Document Revised: 03/02/2016 Document Reviewed: 03/02/2016 °Elsevier Interactive Patient Education © 2017 Elsevier Inc. ° °

## 2016-07-20 NOTE — MAU Note (Signed)
Pt c/o lower abdominal pain and lower back pain.
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
## 2016-07-22 ENCOUNTER — Encounter (HOSPITAL_COMMUNITY): Payer: Self-pay

## 2016-07-22 ENCOUNTER — Encounter (HOSPITAL_COMMUNITY): Payer: Self-pay | Admitting: Emergency Medicine

## 2016-07-22 LAB — GC/CHLAMYDIA PROBE AMP (~~LOC~~) NOT AT ARMC
Chlamydia: NEGATIVE
Neisseria Gonorrhea: NEGATIVE

## 2016-08-20 ENCOUNTER — Encounter (HOSPITAL_COMMUNITY): Payer: Self-pay

## 2016-08-20 ENCOUNTER — Inpatient Hospital Stay (HOSPITAL_COMMUNITY)
Admission: AD | Admit: 2016-08-20 | Discharge: 2016-08-20 | Disposition: A | Discharge status: Home / Self Care | Payer: Medicaid Other | Source: Ambulatory Visit | Attending: Family Medicine | Admitting: Family Medicine

## 2016-08-20 DIAGNOSIS — Z7982 Long term (current) use of aspirin: Secondary | ICD-10-CM | POA: Insufficient documentation

## 2016-08-20 DIAGNOSIS — O26899 Other specified pregnancy related conditions, unspecified trimester: Secondary | ICD-10-CM | POA: Diagnosis not present

## 2016-08-20 DIAGNOSIS — Z3A13 13 weeks gestation of pregnancy: Secondary | ICD-10-CM | POA: Diagnosis not present

## 2016-08-20 DIAGNOSIS — N898 Other specified noninflammatory disorders of vagina: Secondary | ICD-10-CM

## 2016-08-20 DIAGNOSIS — R1030 Lower abdominal pain, unspecified: Secondary | ICD-10-CM | POA: Insufficient documentation

## 2016-08-20 DIAGNOSIS — O98811 Other maternal infectious and parasitic diseases complicating pregnancy, first trimester: Secondary | ICD-10-CM | POA: Diagnosis not present

## 2016-08-20 DIAGNOSIS — O99331 Smoking (tobacco) complicating pregnancy, first trimester: Secondary | ICD-10-CM | POA: Diagnosis not present

## 2016-08-20 DIAGNOSIS — Z3401 Encounter for supervision of normal first pregnancy, first trimester: Secondary | ICD-10-CM

## 2016-08-20 DIAGNOSIS — R102 Pelvic and perineal pain: Secondary | ICD-10-CM

## 2016-08-20 DIAGNOSIS — F17210 Nicotine dependence, cigarettes, uncomplicated: Secondary | ICD-10-CM | POA: Insufficient documentation

## 2016-08-20 DIAGNOSIS — B373 Candidiasis of vulva and vagina: Secondary | ICD-10-CM | POA: Diagnosis not present

## 2016-08-20 DIAGNOSIS — B3731 Acute candidiasis of vulva and vagina: Secondary | ICD-10-CM

## 2016-08-20 DIAGNOSIS — O26892 Other specified pregnancy related conditions, second trimester: Secondary | ICD-10-CM

## 2016-08-20 LAB — URINALYSIS, ROUTINE W REFLEX MICROSCOPIC
Bilirubin Urine: NEGATIVE
GLUCOSE, UA: NEGATIVE mg/dL
Hgb urine dipstick: NEGATIVE
Ketones, ur: NEGATIVE mg/dL
LEUKOCYTES UA: NEGATIVE
Nitrite: NEGATIVE
PH: 6 (ref 5.0–8.0)
Protein, ur: NEGATIVE mg/dL
SPECIFIC GRAVITY, URINE: 1.015 (ref 1.005–1.030)

## 2016-08-20 LAB — WET PREP, GENITAL
CLUE CELLS WET PREP: NONE SEEN
SPERM: NONE SEEN
Trich, Wet Prep: NONE SEEN

## 2016-08-20 MED ORDER — TERCONAZOLE 0.4 % VA CREA: 1.0000 | TOPICAL_CREAM | Freq: Every day | VAGINAL | 0 refills | Status: DC

## 2016-08-20 NOTE — MAU Note (Signed)
[facility] of [facility] wanted to be sure everything was ok.  Pain in lower abd, in one spot, comes and goes.

## 2016-08-20 NOTE — Discharge Instructions (Signed)
Round Ligament Pain During Pregnancy   Round ligament pain is a sharp pain or jabbing feeling often felt in the lower belly or groin area on one or both sides. It is one of the most common complaints during pregnancy and is considered a normal part of pregnancy. It is most often felt during the second trimester.   Here is what you need to know about round ligament pain, including some tips to help you feel better.   Causes of Round Ligament Pain   Several thick ligaments surround and support your womb (uterus) as it grows during pregnancy. One of them is called the round ligament.   The round ligament connects the front part of the womb to your groin, the area where your legs attach to your pelvis. The round ligament normally tightens and relaxes slowly.   As your baby and womb grow, the round ligament stretches. That makes it more likely to become strained.   Sudden movements can cause the ligament to tighten quickly, like a rubber band snapping. This causes a sudden and quick jabbing feeling.   Symptoms of Round Ligament Pain   Round ligament pain can be concerning and uncomfortable. But it is considered normal as your body changes during pregnancy.   The symptoms of round ligament pain include a sharp, sudden spasm in the belly. It usually affects the right side, but it may happen on both sides. The pain only lasts a few seconds.   Exercise may cause the pain, as will rapid movements such as:  sneezing  coughing  laughing  rolling over in bed  standing up too quickly   Treatment of Round Ligament Pain   Here are some tips that may help reduce your discomfort:   Pain relief. Take over-the-counter acetaminophen for pain, if necessary. Ask your doctor if this is OK.   Exercise. Get plenty of exercise to keep your stomach (core) muscles strong. Doing stretching exercises or prenatal yoga can be helpful. Ask your doctor which exercises are safe for you and your baby.   A helpful  exercise involves putting your hands and knees on the floor, lowering your head, and pushing your backside into the air.   Avoid sudden movements. Change positions slowly (such as standing up or sitting down) to avoid sudden movements that may cause stretching and pain.   Flex your hips. Bend and flex your hips before you cough, sneeze, or laugh to avoid pulling on the ligaments.   Apply warmth. A heating pad or warm bath may be helpful. Ask your doctor if this is OK. Extreme heat can be dangerous to the baby.   You should try to modify your daily activity level and avoid positions that may worsen the condition.   When to Call the Doctor/Midwife   Always tell your doctor or midwife about any type of pain you have during pregnancy. Round ligament pain is quick and doesn't last long.   Call your health care provider immediately if you have:  severe pain  fever  chills  pain on urination  difficulty walking   Belly pain during pregnancy can be due to many different causes. It is important for your doctor to rule out more serious conditions, including pregnancy complications such as placenta abruption or non-pregnancy illnesses such as:  inguinal hernia  appendicitis  stomach, liver, and kidney problems  Preterm labor pains may sometimes be mistaken for round ligament pain.     Second Trimester of Pregnancy The second trimester is  from week 13 through week 28, month 4 through 6. This is often the time in pregnancy that you feel your best. Often times, morning sickness has lessened or quit. You may have more energy, and you may get hungry more often. Your unborn baby (fetus) is growing rapidly. At the end of the sixth month, he or she is about 9 inches long and weighs about 1 pounds. You will likely feel the baby move (quickening) between 18 and 20 weeks of pregnancy. Follow these instructions at home:  Avoid all smoking, herbs, and alcohol. Avoid drugs not approved by your doctor.  Do  not use any tobacco products, including cigarettes, chewing tobacco, and electronic cigarettes. If you need help quitting, ask your doctor. You may get counseling or other support to help you quit.  Only take medicine as told by your doctor. Some medicines are safe and some are not during pregnancy.  Exercise only as told by your doctor. Stop exercising if you start having cramps.  Eat regular, healthy meals.  Wear a good support bra if your breasts are tender.  Do not use hot tubs, steam rooms, or saunas.  Wear your seat belt when driving.  Avoid raw meat, uncooked cheese, and liter boxes and soil used by cats.  Take your prenatal vitamins.  Take 1500-2000 milligrams of calcium daily starting at the 20th week of pregnancy until you deliver your baby.  Try taking medicine that helps you poop (stool softener) as needed, and if your doctor approves. Eat more fiber by eating fresh fruit, vegetables, and whole grains. Drink enough fluids to keep your pee (urine) clear or pale yellow.  Take warm water baths (sitz baths) to soothe pain or discomfort caused by hemorrhoids. Use hemorrhoid cream if your doctor approves.  If you have puffy, bulging veins (varicose veins), wear support hose. Raise (elevate) your feet for 15 minutes, 3-4 times a day. Limit salt in your diet.  Avoid heavy lifting, wear low heals, and sit up straight.  Rest with your legs raised if you have leg cramps or low back pain.  Visit your dentist if you have not gone during your pregnancy. Use a soft toothbrush to brush your teeth. Be gentle when you floss.  You can have sex (intercourse) unless your doctor tells you not to.  Go to your doctor visits. Get help if:  You feel dizzy.  You have mild cramps or pressure in your lower belly (abdomen).  You have a nagging pain in your belly area.  You continue to feel sick to your stomach (nauseous), throw up (vomit), or have watery poop (diarrhea).  You have bad  smelling fluid coming from your vagina.  You have pain with peeing (urination). Get help right away if:  You have a fever.  You are leaking fluid from your vagina.  You have spotting or bleeding from your vagina.  You have severe belly cramping or pain.  You lose or gain weight rapidly.  You have trouble catching your breath and have chest pain.  You notice sudden or extreme puffiness (swelling) of your face, hands, ankles, feet, or legs.  You have severe headaches that do not go away with medicine.  You have vision changes. This information is not intended to replace advice given to you by your health care provider. Make sure you discuss any questions you have with your health care provider. Document Released: 09/11/2009 Document Revised: 11/23/2015 Document Reviewed: 08/18/2012 Elsevier Interactive Patient Education  2017 [employer]
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
## 2016-08-21 LAB — GC/CHLAMYDIA PROBE AMP (~~LOC~~) NOT AT ARMC
CHLAMYDIA, DNA PROBE: NEGATIVE
Neisseria Gonorrhea: NEGATIVE

## 2016-09-08 ENCOUNTER — Emergency Department (HOSPITAL_COMMUNITY)
Admission: EM | Admit: 2016-09-08 | Discharge: 2016-09-08 | Disposition: A | Discharge status: Home / Self Care | Payer: Medicaid Other | Attending: Emergency Medicine | Admitting: Emergency Medicine

## 2016-09-08 ENCOUNTER — Encounter (HOSPITAL_COMMUNITY): Payer: Self-pay | Admitting: *Deleted

## 2016-09-08 DIAGNOSIS — R55 Syncope and collapse: Secondary | ICD-10-CM | POA: Diagnosis not present

## 2016-09-08 DIAGNOSIS — F17210 Nicotine dependence, cigarettes, uncomplicated: Secondary | ICD-10-CM | POA: Insufficient documentation

## 2016-09-08 LAB — CBC WITH DIFFERENTIAL/PLATELET
BASOS PCT: 0 %
Basophils Absolute: 0 10*3/uL (ref 0.0–0.1)
EOS ABS: 0.2 10*3/uL (ref 0.0–0.7)
Eosinophils Relative: 2 %
HEMATOCRIT: 32.2 % — AB (ref 36.0–46.0)
Hemoglobin: 10.9 g/dL — ABNORMAL LOW (ref 12.0–15.0)
Lymphocytes Relative: 18 %
Lymphs Abs: 2.1 10*3/uL (ref 0.7–4.0)
MCH: 28.5 pg (ref 26.0–34.0)
MCHC: 33.9 g/dL (ref 30.0–36.0)
MCV: 84.1 fL (ref 78.0–100.0)
MONO ABS: 0.4 10*3/uL (ref 0.1–1.0)
MONOS PCT: 3 %
Neutro Abs: 9.2 10*3/uL — ABNORMAL HIGH (ref 1.7–7.7)
Neutrophils Relative %: 77 %
Platelets: 237 10*3/uL (ref 150–400)
RBC: 3.83 MIL/uL — ABNORMAL LOW (ref 3.87–5.11)
RDW: 19.9 % — AB (ref 11.5–15.5)
WBC: 12 10*3/uL — ABNORMAL HIGH (ref 4.0–10.5)

## 2016-09-08 LAB — COMPREHENSIVE METABOLIC PANEL
ALK PHOS: 51 U/L (ref 38–126)
ALT: 13 U/L — ABNORMAL LOW (ref 14–54)
AST: 16 U/L (ref 15–41)
Albumin: 3.2 g/dL — ABNORMAL LOW (ref 3.5–5.0)
Anion gap: 5 (ref 5–15)
BUN: 5 mg/dL — AB (ref 6–20)
CALCIUM: 8.8 mg/dL — AB (ref 8.9–10.3)
CO2: 24 mmol/L (ref 22–32)
CREATININE: 0.54 mg/dL (ref 0.44–1.00)
Chloride: 107 mmol/L (ref 101–111)
GFR calc Af Amer: >60 mL/min (ref >60)
GFR calc non Af Amer: >60 mL/min (ref >60)
GLUCOSE: 114 mg/dL — AB (ref 65–99)
Potassium: 3.8 mmol/L (ref 3.5–5.1)
Sodium: 136 mmol/L (ref 135–145)
Total Bilirubin: 0.5 mg/dL (ref 0.3–1.2)
Total Protein: 5.8 g/dL — ABNORMAL LOW (ref 6.5–8.1)

## 2016-09-08 MED ORDER — SODIUM CHLORIDE 0.9 % IV BOLUS (SEPSIS)
1000.0000 mL | Freq: Once | INTRAVENOUS | Status: AC
  Administered 2016-09-08: 1000 mL via INTRAVENOUS

## 2016-09-08 NOTE — ED Triage Notes (Signed)
Per EMS- pt reports not eating or drinking or taking her prenatal vitamins today. Pt reports feeling lightheaded and then passing out. bystanders reports brief seconds of LOC. Pt reports approx 5 months pregnant. 102/66. CBG 71 pt received instaglucose and increased 97. Reports head pain, unsure if pt struck her head.
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
## 2016-09-08 NOTE — Discharge Instructions (Signed)
It is very important that you take your prenatal vitamins daily and follow up with the health department / OBGYN for prenatal care! I would like you to call the clinic listed in the morning to schedule an appointment.  Increase hydration. Return to ER for new or worsening symptoms, any additional concerns.

## 2016-09-08 NOTE — ED Notes (Signed)
Pt stable, ambulatory, states understanding of discharge iinstructions 

## 2016-09-08 NOTE — Progress Notes (Signed)
Pt is a G1P0 at ? [redacted] weeks gestation.  Pt is here because she had a syncopal episode today. Pt said that she has not eaten today. No vaginal bleeding or leaking of fluid. FHR 145 by doppler. Ptt given IVF and a Mal[place]key sandwich. Pt says she has not had PNC. [facility]Dr. Despina HiddenEure notified. Pt is OB cleared. ED staff notified.

## 2016-09-25 ENCOUNTER — Encounter: Payer: Self-pay | Admitting: Obstetrics

## 2016-09-25 ENCOUNTER — Ambulatory Visit (INDEPENDENT_AMBULATORY_CARE_PROVIDER_SITE_OTHER): Payer: Medicaid Other | Admitting: Obstetrics

## 2016-09-25 ENCOUNTER — Other Ambulatory Visit (HOSPITAL_COMMUNITY)
Admission: RE | Admit: 2016-09-25 | Discharge: 2016-09-25 | Disposition: A | Discharge status: Home / Self Care | Payer: Medicaid Other | Source: Ambulatory Visit | Attending: Obstetrics | Admitting: Obstetrics

## 2016-09-25 VITALS — BP 97/63 | HR 99 | Wt 181.0 lb

## 2016-09-25 DIAGNOSIS — Z3480 Encounter for supervision of other normal pregnancy, unspecified trimester: Secondary | ICD-10-CM

## 2016-09-25 DIAGNOSIS — O9989 Other specified diseases and conditions complicating pregnancy, childbirth and the puerperium: Secondary | ICD-10-CM

## 2016-09-25 DIAGNOSIS — Z3481 Encounter for supervision of other normal pregnancy, first trimester: Secondary | ICD-10-CM

## 2016-09-25 DIAGNOSIS — N898 Other specified noninflammatory disorders of vagina: Secondary | ICD-10-CM | POA: Insufficient documentation

## 2016-09-25 DIAGNOSIS — O0932 Supervision of pregnancy with insufficient antenatal care, second trimester: Secondary | ICD-10-CM

## 2016-09-25 LAB — POCT URINALYSIS DIPSTICK
BILIRUBIN UA: NEGATIVE
Blood, UA: NEGATIVE
GLUCOSE UA: NEGATIVE
KETONES UA: NEGATIVE
Leukocytes, UA: NEGATIVE
NITRITE UA: NEGATIVE
Protein, UA: NEGATIVE
SPEC GRAV UA: 1.01 (ref 1.030–1.035)
Urobilinogen, UA: 0.2 (ref ?–2.0)
pH, UA: 6 (ref 5.0–8.0)

## 2016-09-25 NOTE — Progress Notes (Signed)
Pt presents for initial NOB visit. Needs Anatomy scan.
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
## 2016-09-27 LAB — CERVICOVAGINAL ANCILLARY ONLY
Bacterial vaginitis: NEGATIVE
Candida vaginitis: POSITIVE — AB
Chlamydia: NEGATIVE
Neisseria Gonorrhea: NEGATIVE
Trichomonas: NEGATIVE

## 2016-09-28 ENCOUNTER — Other Ambulatory Visit: Payer: Self-pay | Admitting: Obstetrics

## 2016-09-28 DIAGNOSIS — B373 Candidiasis of vulva and vagina: Secondary | ICD-10-CM

## 2016-09-28 DIAGNOSIS — B3731 Acute candidiasis of vulva and vagina: Secondary | ICD-10-CM

## 2016-09-28 MED ORDER — TERCONAZOLE 0.4 % VA CREA: 1.0000 | TOPICAL_CREAM | Freq: Every day | VAGINAL | 0 refills | Status: DC

## 2016-09-30 ENCOUNTER — Other Ambulatory Visit: Payer: Self-pay | Admitting: Certified Nurse Midwife

## 2016-09-30 DIAGNOSIS — B373 Candidiasis of vulva and vagina: Secondary | ICD-10-CM

## 2016-09-30 DIAGNOSIS — B3731 Acute candidiasis of vulva and vagina: Secondary | ICD-10-CM

## 2016-09-30 MED ORDER — TERCONAZOLE 0.4 % VA CREA: 1.0000 | TOPICAL_CREAM | Freq: Every day | VAGINAL | 0 refills | Status: DC

## 2016-10-01 ENCOUNTER — Other Ambulatory Visit: Payer: Self-pay | Admitting: Obstetrics

## 2016-10-01 DIAGNOSIS — B955 Unspecified streptococcus as the cause of diseases classified elsewhere: Secondary | ICD-10-CM

## 2016-10-01 DIAGNOSIS — O2340 Unspecified infection of urinary tract in pregnancy, unspecified trimester: Principal | ICD-10-CM

## 2016-10-01 LAB — URINE CULTURE, OB REFLEX

## 2016-10-01 LAB — CULTURE, OB URINE

## 2016-10-01 MED ORDER — AMOXICILLIN-POT CLAVULANATE 875-125 MG PO TABS: 1.0000 | ORAL_TABLET | Freq: Two times a day (BID) | ORAL | 0 refills | Status: DC

## 2016-10-02 ENCOUNTER — Telehealth: Payer: Self-pay | Admitting: *Deleted

## 2016-10-02 ENCOUNTER — Other Ambulatory Visit: Payer: Self-pay | Admitting: Obstetrics

## 2016-10-02 DIAGNOSIS — O280 Abnormal hematological finding on antenatal screening of mother: Secondary | ICD-10-CM

## 2016-10-02 NOTE — Telephone Encounter (Signed)
Received from [facility] regarding pt AFP testing. Additional information was needed in order to process test. 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 Result was verified given u/s dating.   Please advise on result / orders.

## 2016-10-03 ENCOUNTER — Other Ambulatory Visit: Payer: Self-pay | Admitting: Obstetrics

## 2016-10-03 DIAGNOSIS — O280 Abnormal hematological finding on antenatal screening of mother: Secondary | ICD-10-CM

## 2016-10-03 LAB — AFP, QUAD SCREEN
DIA MOM VALUE: 1.92
DIA VALUE (EIA): 290.96 pg/mL
DSR (By Age)    1 IN: 1150
DSR (SECOND TRIMESTER) 1 IN: 133
GESTATIONAL AGE AFP: 18.3 wk
MSAFP MOM: 0.8
MSAFP: 34.6 ng/mL
MSHCG Mom: 1.87
MSHCG: 43850 m[IU]/mL
Maternal Age At EDD: 20.9 YEARS
OSB RISK: 10000
T18 (By Age): 1:4480 {titer}
Test Results:: POSITIVE — AB
UE3 MOM: 0.57
UE3 VALUE: 0.73 ng/mL
WEIGHT: 181 [lb_av]

## 2016-10-03 LAB — OBSTETRIC PANEL, INCLUDING HIV
ANTIBODY SCREEN: NEGATIVE
BASOS ABS: 0 10*3/uL (ref 0.0–0.2)
BASOS: 0 %
EOS (ABSOLUTE): 0.2 10*3/uL (ref 0.0–0.4)
Eos: 1 %
HEMATOCRIT: 33.3 % — AB (ref 34.0–46.6)
HIV SCREEN 4TH GENERATION: NONREACTIVE
Hemoglobin: 11 g/dL — ABNORMAL LOW (ref 11.1–15.9)
Hepatitis B Surface Ag: NEGATIVE
Immature Grans (Abs): 0 10*3/uL (ref 0.0–0.1)
Immature Granulocytes: 0 %
LYMPHS ABS: 2.2 10*3/uL (ref 0.7–3.1)
Lymphs: 15 %
MCH: 29.3 pg (ref 26.6–33.0)
MCHC: 33 g/dL (ref 31.5–35.7)
MCV: 89 fL (ref 79–97)
Monocytes Absolute: 0.8 10*3/uL (ref 0.1–0.9)
Monocytes: 6 %
NEUTROS ABS: 11.6 10*3/uL — AB (ref 1.4–7.0)
Neutrophils: 78 %
PLATELETS: 266 10*3/uL (ref 150–379)
RBC: 3.76 x10E6/uL — ABNORMAL LOW (ref 3.77–5.28)
RDW: 19.8 % — AB (ref 12.3–15.4)
RPR Ser Ql: NONREACTIVE
Rh Factor: POSITIVE
Rubella Antibodies, IGG: 2.34 index (ref >0.99)
WBC: 14.9 10*3/uL — ABNORMAL HIGH (ref 3.4–10.8)

## 2016-10-03 LAB — HEMOGLOBINOPATHY EVALUATION
HEMOGLOBIN A2 QUANTITATION: 2.7 % (ref 1.8–3.2)
HGB A: 97.3 % (ref 96.4–98.8)
HGB C: 0 %
HGB S: 0 %
HGB VARIANT: 0 %
Hemoglobin F Quantitation: 0 % (ref 0.0–2.0)

## 2016-10-03 LAB — VITAMIN D 25 HYDROXY (VIT D DEFICIENCY, FRACTURES): Vit D, 25-Hydroxy: 21.3 ng/mL — ABNORMAL LOW (ref 30.0–100.0)

## 2016-10-03 LAB — VARICELLA ZOSTER ANTIBODY, IGG: Varicella zoster IgG: <135 index — ABNORMAL LOW (ref >165)

## 2016-10-04 ENCOUNTER — Telehealth: Payer: Self-pay

## 2016-10-04 NOTE — Telephone Encounter (Signed)
Attempted to contact patient about lab results and u/s appt, left message on vm, all other numbers on file are disconnected.

## 2016-10-07 ENCOUNTER — Ambulatory Visit (HOSPITAL_COMMUNITY)
Admission: RE | Admit: 2016-10-07 | Discharge: 2016-10-07 | Disposition: A | Discharge status: Home / Self Care | Payer: Medicaid Other | Source: Ambulatory Visit | Attending: Obstetrics | Admitting: Obstetrics

## 2016-10-07 ENCOUNTER — Ambulatory Visit (HOSPITAL_COMMUNITY): Payer: No Typology Code available for payment source

## 2016-10-07 DIAGNOSIS — Z3A20 20 weeks gestation of pregnancy: Secondary | ICD-10-CM | POA: Diagnosis not present

## 2016-10-07 DIAGNOSIS — O280 Abnormal hematological finding on antenatal screening of mother: Secondary | ICD-10-CM | POA: Insufficient documentation

## 2016-10-07 NOTE — Progress Notes (Signed)
Pt aware.

## 2016-10-10 ENCOUNTER — Other Ambulatory Visit (HOSPITAL_COMMUNITY): Payer: Self-pay | Admitting: *Deleted

## 2016-10-10 ENCOUNTER — Ambulatory Visit (HOSPITAL_COMMUNITY)
Admission: RE | Admit: 2016-10-10 | Discharge: 2016-10-10 | Disposition: A | Discharge status: Home / Self Care | Payer: Medicaid Other | Source: Ambulatory Visit | Attending: Obstetrics | Admitting: Obstetrics

## 2016-10-10 DIAGNOSIS — O280 Abnormal hematological finding on antenatal screening of mother: Secondary | ICD-10-CM | POA: Insufficient documentation

## 2016-10-10 DIAGNOSIS — Z3A20 20 weeks gestation of pregnancy: Secondary | ICD-10-CM | POA: Insufficient documentation

## 2016-10-10 DIAGNOSIS — Z0489 Encounter for examination and observation for other specified reasons: Secondary | ICD-10-CM

## 2016-10-10 DIAGNOSIS — IMO0002 Reserved for concepts with insufficient information to code with codable children: Secondary | ICD-10-CM

## 2016-10-10 NOTE — Progress Notes (Signed)
Genetic Counseling  High-Risk Gestation Note  Appointment Date:  10/10/2016 Referred By: Brock Bad, MD Date of Birth:  09/18/1995   Pregnancy History: G3P0020 Estimated Date of Delivery: 02/24/17 Estimated Gestational Age: [redacted]w[redacted]d Attending: Alpha Gula, MD   Ms. Jenna [name] was seen for genetic counseling because of an increased risk for fetal Down syndrome based on Quad screen through [employer]. She was accompanied by her cousin to today's visit.   In summary:  Reviewed results of screening tests  Increased risk for Down syndrome (1 in 133)  Ultrasound performed on 10/07/16 - wnl but limited views of fetal heart and spine; follow-up scheduled 11/07/16  Discussed additional screening options  NIPS- elected to pursue today (Panorama)  Ultrasound  Discussed diagnostic testing options  Amniocentesis-declined  Reviewed family history concerns  Discussed general population carrier screening options- declined  CF- declined  SMA-declined  Hemoglobinopathies- within normal limits  She was counseled regarding the screening result and the associated 1 in 133 risk for fetal Down syndrome.  We reviewed chromosomes, nondisjunction, and the common features and variable prognosis of Down syndrome.  In addition, we reviewed the screen adjusted reduction in risks for trisomy 18 and open neural tube defects.  We also discussed other explanations for a screen positive result including: a gestational dating error, differences in maternal metabolism, and normal variation.   We reviewed ultrasound as a screening tool for fetal aneuploidy including the benefits and limitations. Ultrasound was performed on 10/07/[facility]. We discussed that no markers of fetal aneuploidy were visualized at that time. However, limited views of fetal heart and spine were obtained. She understands that ultrasound cannot diagnose or rule out all birth defects or  genetic conditions and that ultrasound is not diagnostic for Down syndrome. Follow-up ultrasound is scheduled for 11/07/16.   We reviewed other available screening option of noninvasive prenatal screening (NIPS)/cell free DNA (cfDNA) screening.  She was counseled that screening tests are used to modify a patient's a priori risk for aneuploidy, typically based on age. This estimate provides a pregnancy specific risk assessment. We reviewed the benefits and limitations of each option. Specifically, we discussed the conditions for which each test screens, the detection rates, and false positive rates of each. She was also counseled regarding diagnostic testing via amniocentesis. We reviewed the approximate 1 in 300-500 risk for complications from amniocentesis, including spontaneous pregnancy loss. We discussed the possible results that the tests might provide including: positive, negative, unanticipated, and no result. Finally, they were counseled regarding the cost of each option and potential out of pocket expenses. After consideration of all the options, she elected to proceed with NIPS ([facility] through [facility]).  Those results will be available in 8-10 days.  She declined amniocentesis. Ms. Jenna [name] stated that further testing was for information gathering purposes only and not to alter the course of the pregnancy.   She understands that screening tests cannot rule out all birth defects or genetic syndromes. The patient was advised of this limitation and states she still does not want additional testing at this time.   Ms. Jenna [name] was provided with written information regarding cystic fibrosis (CF), spinal muscular atrophy (SMA) and hemoglobinopathies including the carrier frequency, availability of carrier screening and prenatal diagnosis if indicated.  In addition, we discussed that CF and hemoglobinopathies are routinely screened for as part of the [place] newborn screening panel. Hemoglobin  electrophoresis was previously performed through her OB office and  was within normal range. After further discussion, she declined screening for CF and SMA.    Both family histories were reviewed and found to be contributory for a female maternal first cousin once removed to the patient (her maternal grandmother's brother's son) with Down syndrome and autism, a female maternal second cousin with Down syndrome and a maternal first cousin once removed (her maternal grandmother's sister's daughter) with autism. We discussed that 95% of cases of Down syndrome are not inherited and are the result of non-disjunction.  Three to 4% of cases of Down syndrome are the result of a translocation involving chromosome #21.  We discussed the option of chromosome analysis to determine if an individual is a carrier of a balanced translocation involving chromosome #21.  If an individual carries a balanced translocation involving chromosome #21, then the chance to have a baby with Down syndrome would be greater than the maternal age-related risk.  We discussed that the reported family history for the affected individuals (who are first cousins once removed to each other) is most suggestive of sporadic occurrence. Additional information regarding their karyotype and etiologies may change recurrence risk assessment.   We discussed that autism is part of the spectrum of conditions referred to as Autistic spectrum disorders (ASD). We discussed that ASDs are among the most common neurodevelopmental disorders, with approximately 1 in 68 children meeting criteria for ASD, according to the Centers for Disease Control. Approximately 80% of individuals diagnosed are female. There is strong evidence that genetic factors play a critical role in development of ASD. There have been recent advances in identifying specific genetic causes of ASD, however, there are still many individuals for whom the etiology of the ASD is not known. They understand  that at this time there is not genetic testing available for ASD for most families. We discussed that recurrence risk data for extended degree relatives is limited. However, given the reported family history, recurrence risk would most likely be similar to the general population risk, in the case of multifactorial inheritance. In the absence of an identified genetic etiology, prenatal screening or testing would not be available in the current pregnancy for the autism spectrum disorders in the family.    Ms. Jenna [name] reported that her maternal aunt has sickle cell trait. We reviewed the autosomal recessive inheritance of sickle cell disease and reviewed Jenna [name]' hemoglobin electrophoresis which revealed the presence of normal adult hemoglobin. Thus, she does not appear to have sickle cell trait. Without further information regarding the provided family history, an accurate genetic risk cannot be calculated. Further genetic counseling is warranted if more information is obtained.  Jenna [name] denied exposure to environmental toxins or chemical agents. She denied the use of alcohol or street drugs. She reported smoking cigarettes occasionally during the pregnancy. The associations of smoking in pregnancy were reviewed and cessation encouraged. She denied significant viral illnesses during the course of her pregnancy. Her medical and surgical histories were noncontributory.   I counseled Ms. Jenna [name] for approximately 45 minutes regarding the above risks and available options.   Quinn Plowman, MS,  Certified Genetic Counselor 10/10/2016

## 2016-10-17 ENCOUNTER — Other Ambulatory Visit: Payer: Self-pay

## 2016-10-23 ENCOUNTER — Encounter: Payer: Self-pay | Admitting: Obstetrics

## 2016-10-23 ENCOUNTER — Ambulatory Visit (INDEPENDENT_AMBULATORY_CARE_PROVIDER_SITE_OTHER): Payer: Medicaid Other | Admitting: Obstetrics

## 2016-10-23 VITALS — BP 100/67 | HR 98 | Wt 187.0 lb

## 2016-10-23 DIAGNOSIS — Z3483 Encounter for supervision of other normal pregnancy, third trimester: Secondary | ICD-10-CM

## 2016-10-23 DIAGNOSIS — Z3480 Encounter for supervision of other normal pregnancy, unspecified trimester: Secondary | ICD-10-CM

## 2016-10-23 NOTE — Progress Notes (Signed)
Subjective:  Jenna [name] is a 21 y.o. G3P0020 at [redacted]w[redacted]d being seen today for ongoing prenatal care.  She is currently monitored for the following issues for this low-risk pregnancy and has Supervision of normal first pregnancy in first trimester; Abnormal maternal serum screening test; and [redacted] weeks gestation of pregnancy on her problem list.  Patient reports no complaints.  Contractions: Not present. Vag. Bleeding: None.   . Denies leaking of fluid.   The following portions of the patient's history were reviewed and updated as appropriate: allergies, current medications, past family history, past medical history, past social history, past surgical history and problem list. Problem list updated.  Objective:   Vitals:   10/23/16 1516  BP: 100/67  Pulse: 98  Weight: 187 lb (84.8 kg)    Fetal Status: Fetal Heart Rate (bpm): 150         General:  Alert, oriented and cooperative. Patient is in no acute distress.  Skin: Skin is warm and dry. No rash noted.   Cardiovascular: Normal heart rate noted  Respiratory: Normal respiratory effort, no problems with respiration noted  Abdomen: Soft, gravid, appropriate for gestational age. Pain/Pressure: Absent     Pelvic:  Cervical exam deferred        Extremities: Normal range of motion.     Mental Status: Normal mood and affect. Normal behavior. Normal judgment and thought content.   Urinalysis:      Assessment and Plan:  Pregnancy: G3P0020 at [redacted]w[redacted]d  There are no diagnoses linked to this encounter. Preterm labor symptoms and general obstetric precautions including but not limited to vaginal bleeding, contractions, leaking of fluid and fetal movement were reviewed in detail with the patient. Please refer to After Visit Summary for other counseling recommendations.  Return in 4 weeks (on 11/20/2016).   Brock Bad, MDPatient ID: Jenna [name], female   DOB: February 10, 1996, 20 y.o.   MRN: [id number]

## 2016-10-24 ENCOUNTER — Encounter: Payer: Self-pay | Admitting: Obstetrics & Gynecology

## 2016-10-24 DIAGNOSIS — O34219 Maternal care for unspecified type scar from previous cesarean delivery: Secondary | ICD-10-CM | POA: Insufficient documentation

## 2016-10-27 ENCOUNTER — Encounter: Payer: Self-pay | Admitting: Obstetrics & Gynecology

## 2016-10-27 DIAGNOSIS — O99330 Smoking (tobacco) complicating pregnancy, unspecified trimester: Secondary | ICD-10-CM | POA: Insufficient documentation

## 2016-11-01 ENCOUNTER — Other Ambulatory Visit: Payer: Self-pay

## 2016-11-07 ENCOUNTER — Ambulatory Visit (HOSPITAL_COMMUNITY)
Admission: RE | Admit: 2016-11-07 | Discharge: 2016-11-07 | Disposition: A | Discharge status: Home / Self Care | Payer: Medicaid Other | Source: Ambulatory Visit | Attending: Obstetrics | Admitting: Obstetrics

## 2016-11-07 ENCOUNTER — Encounter (HOSPITAL_COMMUNITY): Payer: Self-pay

## 2016-11-07 DIAGNOSIS — Z0489 Encounter for examination and observation for other specified reasons: Secondary | ICD-10-CM

## 2016-11-07 DIAGNOSIS — IMO0002 Reserved for concepts with insufficient information to code with codable children: Secondary | ICD-10-CM

## 2016-11-07 DIAGNOSIS — Z362 Encounter for other antenatal screening follow-up: Secondary | ICD-10-CM | POA: Insufficient documentation

## 2016-11-20 ENCOUNTER — Encounter: Payer: No Typology Code available for payment source | Admitting: Obstetrics

## 2016-12-16 ENCOUNTER — Encounter: Payer: Medicaid Other | Admitting: Obstetrics

## 2016-12-16 ENCOUNTER — Other Ambulatory Visit: Payer: No Typology Code available for payment source

## 2016-12-22 IMAGING — US US OB TRANSVAGINAL
1 series · 15 of 27 positions shown · non-contrast
Comparison: 02/15/2016

CLINICAL DATA: Pelvic pain, cramping, and vaginal bleeding for 3
days. Current assigned gestational age of 11 weeks 5 days by prior
ultrasound.

EXAM:
TRANSVAGINAL OB ULTRASOUND
TECHNIQUE: Transvaginal ultrasound was performed for complete evaluation of the
gestation as well as the maternal uterus, adnexal regions, and
pelvic cul-de-sac.

[Series 1: us ob transvaginal · 27 acquisitions, 15 frames shown]
[im 1/27]
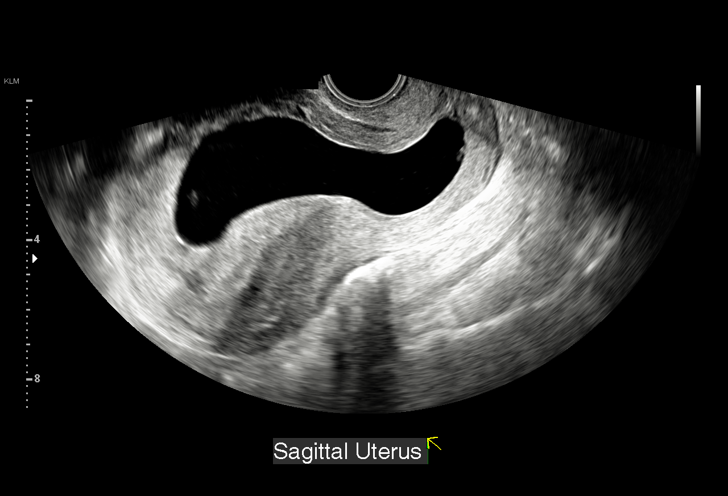
[im 3/27]
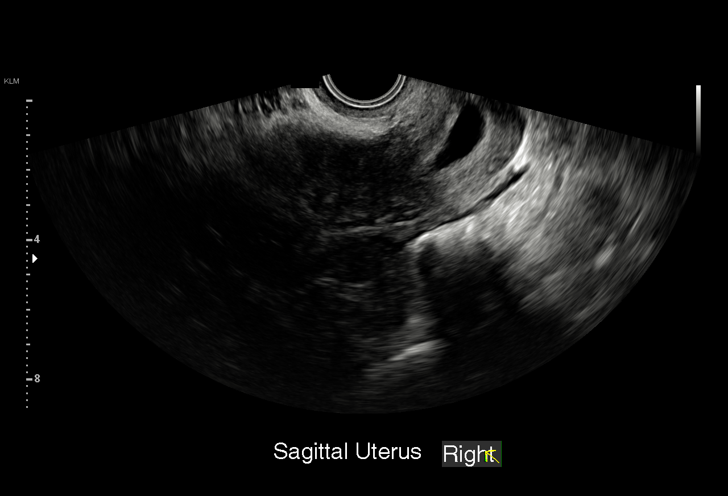
[im 5/27]
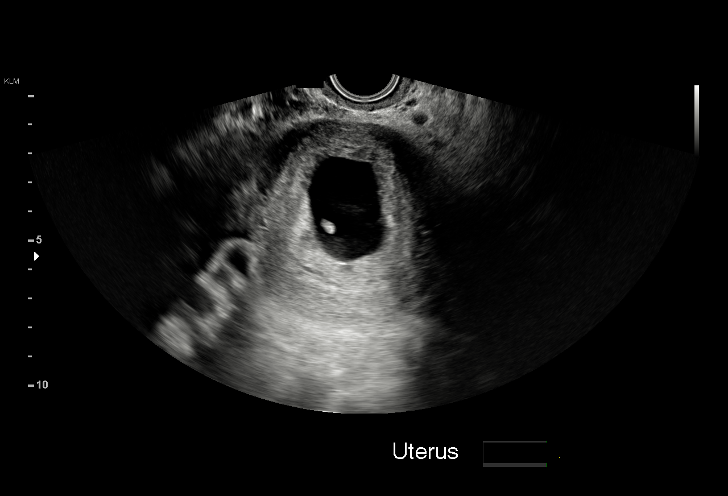
[im 7/27]
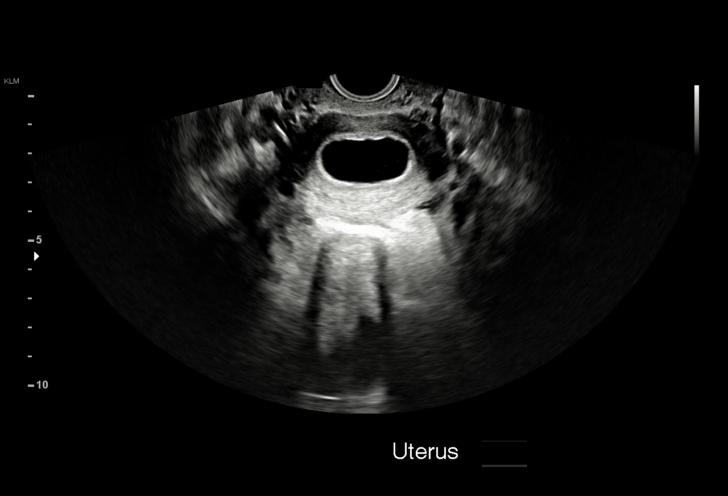
[im 9/27]
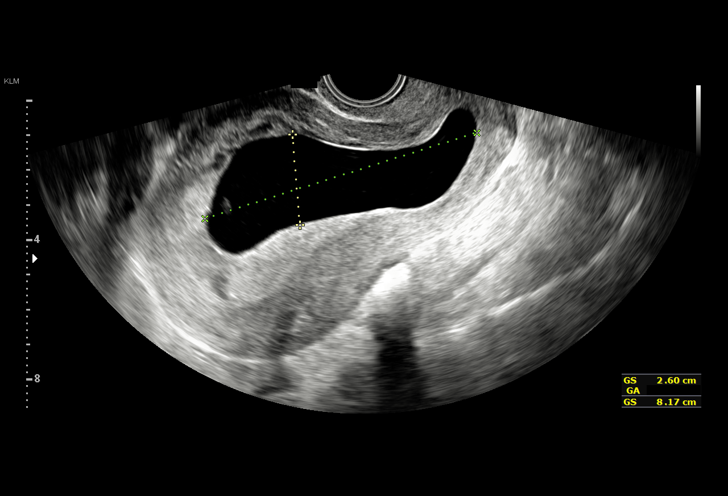
[im 10/27]
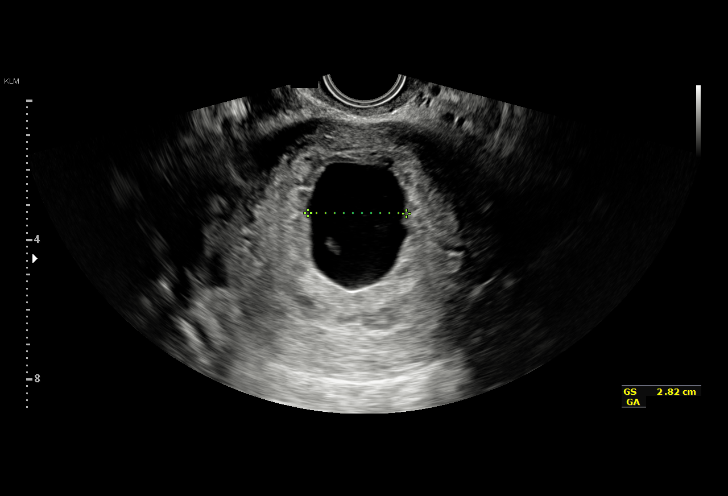
[im 12/27]
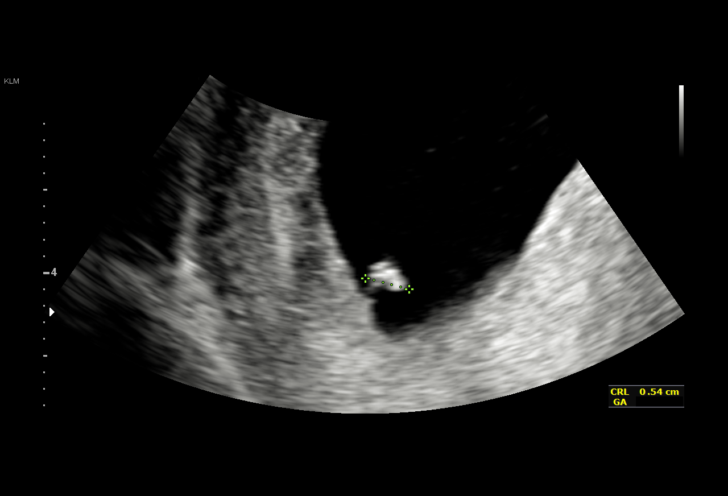
[im 14/27]
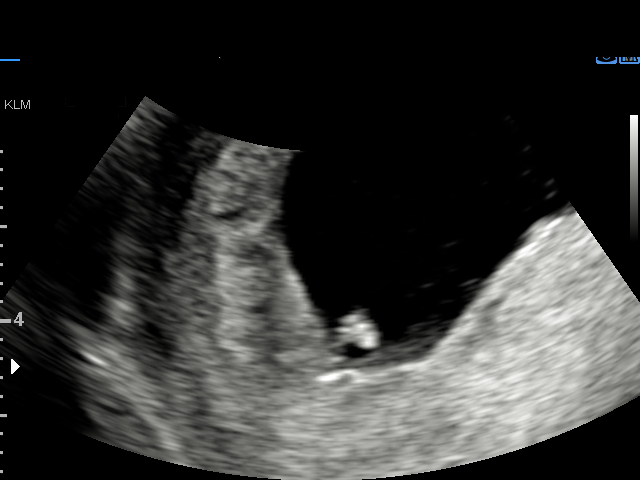
[im 16/27]
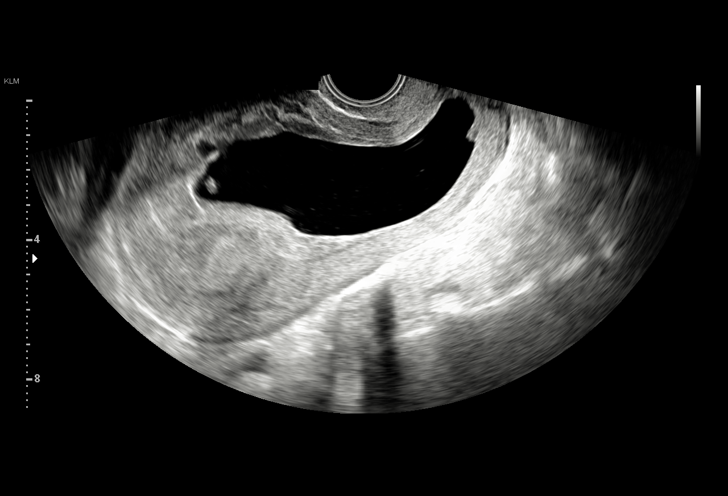
[im 18/27]
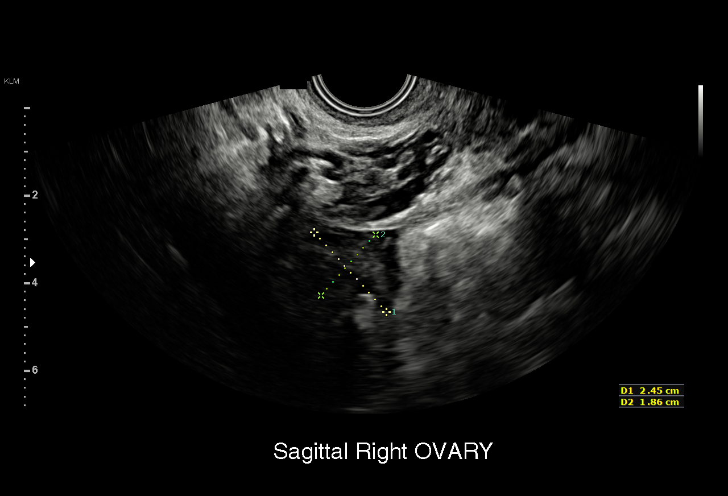
[im 19/27]
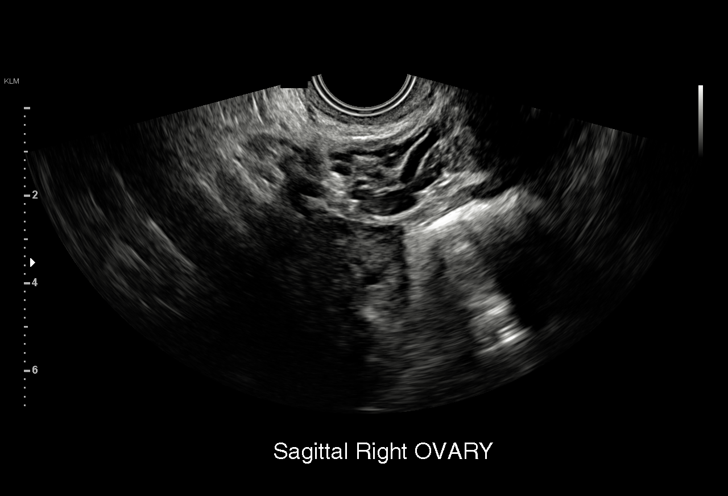
[im 21/27]
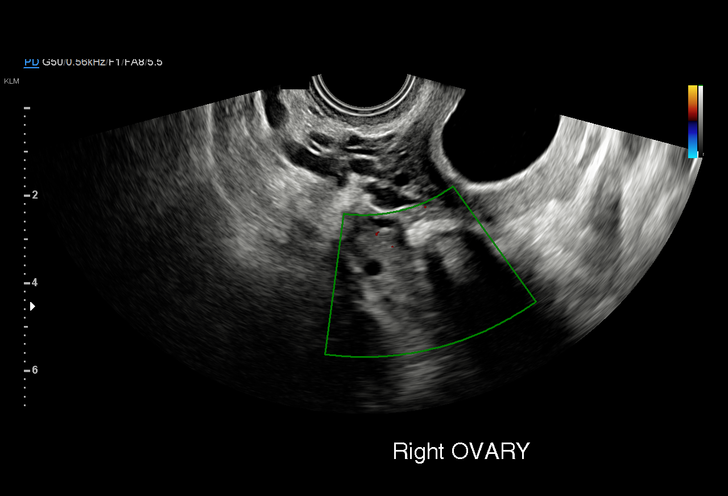
[im 23/27]
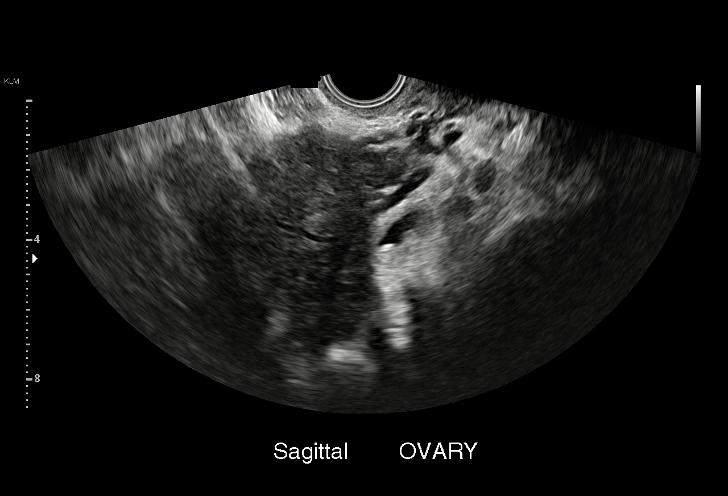
[im 25/27]
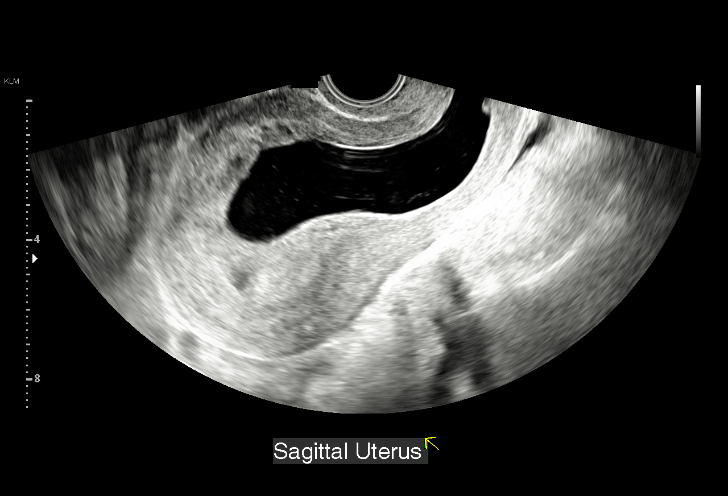
[im 27/27]
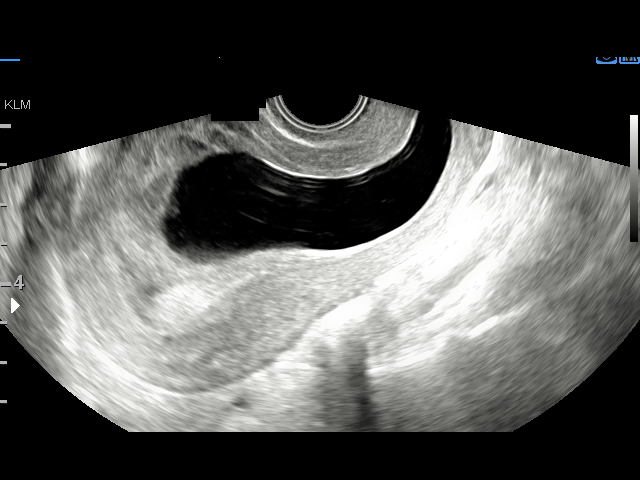

[15 of 27 positions shown; findings below may reference images not displayed]

FINDINGS: Intrauterine gestational sac: Single ; large size and irregular
shape with extension into lower uterine segment.

Yolk sac:  Not visualized

Embryo:  Visualized

Cardiac Activity: Absent

MSD: 45  mm   10 w   0  d

CRL:   5  mm   6 w 1 d                  US EDC: 11/04/2016

Subchorionic hemorrhage:  None visualized.

Maternal uterus/adnexae: Normal appearance of both ovaries. No mass
or free fluid identified.
IMPRESSION: Single IUP shows absence of embryonic cardiac activity and growth
since previous study, consistent with failed pregnancy. With

## 2017-01-02 ENCOUNTER — Other Ambulatory Visit: Payer: No Typology Code available for payment source

## 2017-01-02 ENCOUNTER — Encounter: Payer: Self-pay | Admitting: Advanced Practice Midwife

## 2017-01-02 ENCOUNTER — Ambulatory Visit (INDEPENDENT_AMBULATORY_CARE_PROVIDER_SITE_OTHER): Payer: Medicaid Other | Admitting: Advanced Practice Midwife

## 2017-01-02 VITALS — BP 125/79 | HR 80 | Wt 185.4 lb

## 2017-01-02 DIAGNOSIS — Z3401 Encounter for supervision of normal first pregnancy, first trimester: Secondary | ICD-10-CM

## 2017-01-02 DIAGNOSIS — O0933 Supervision of pregnancy with insufficient antenatal care, third trimester: Secondary | ICD-10-CM | POA: Diagnosis not present

## 2017-01-02 DIAGNOSIS — O280 Abnormal hematological finding on antenatal screening of mother: Secondary | ICD-10-CM

## 2017-01-02 DIAGNOSIS — Z659 Problem related to unspecified psychosocial circumstances: Secondary | ICD-10-CM | POA: Insufficient documentation

## 2017-01-02 DIAGNOSIS — O34219 Maternal care for unspecified type scar from previous cesarean delivery: Secondary | ICD-10-CM

## 2017-01-02 MED ORDER — TETANUS-DIPHTH-ACELL PERTUSSIS 5-2.5-18.5 LF-MCG/0.5 IM SUSP
0.5000 mL | Freq: Once | INTRAMUSCULAR | Status: AC
  Administered 2017-01-02: 0.5 mL via INTRAMUSCULAR

## 2017-01-02 NOTE — Progress Notes (Signed)
Patient has questions about delivery- she is concerned about her hip anatomy.
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
## 2017-01-02 NOTE — Patient Instructions (Signed)
Third Trimester of Pregnancy The third trimester is from week 28 through week 40 (months 7 through 9). The third trimester is a time when the unborn baby (fetus) is growing rapidly. At the end of the ninth month, the fetus is about 20 inches in length and weighs 6-10 pounds. Body changes during your third trimester Your body will continue to go through many changes during pregnancy. The changes vary from woman to woman. During the third trimester:  Your weight will continue to increase. You can expect to gain 25-35 pounds (11-16 kg) by the end of the pregnancy.  You may begin to get stretch marks on your hips, abdomen, and breasts.  You may urinate more often because the fetus is moving lower into your pelvis and pressing on your bladder.  You may develop or continue to have heartburn. This is caused by increased hormones that slow down muscles in the digestive tract.  You may develop or continue to have constipation because increased hormones slow digestion and cause the muscles that push waste through your intestines to relax.  You may develop hemorrhoids. These are swollen veins (varicose veins) in the rectum that can itch or be painful.  You may develop swollen, bulging veins (varicose veins) in your legs.  You may have increased body aches in the pelvis, back, or thighs. This is due to weight gain and increased hormones that are relaxing your joints.  You may have changes in your hair. These can include thickening of your hair, rapid growth, and changes in texture. Some women also have hair loss during or after pregnancy, or hair that feels dry or thin. Your hair will most likely return to normal after your baby is born.  Your breasts will continue to grow and they will continue to become tender. A yellow fluid (colostrum) may leak from your breasts. This is the first milk you are producing for your baby.  Your belly button may stick out.  You may notice more swelling in your hands,  face, or ankles.  You may have increased tingling or numbness in your hands, arms, and legs. The skin on your belly may also feel numb.  You may feel short of breath because of your expanding uterus.  You may have more problems sleeping. This can be caused by the size of your belly, increased need to urinate, and an increase in your body's metabolism.  You may notice the fetus "dropping," or moving lower in your abdomen (lightening).  You may have increased vaginal discharge.  You may notice your joints feel loose and you may have pain around your pelvic bone.  What to expect at prenatal visits You will have prenatal exams every 2 weeks until week 36. Then you will have weekly prenatal exams. During a routine prenatal visit:  You will be weighed to make sure you and the baby are growing normally.  Your blood pressure will be taken.  Your abdomen will be measured to track your baby's growth.  The fetal heartbeat will be listened to.  Any test results from the previous visit will be discussed.  You may have a cervical check near your due date to see if your cervix has softened or thinned (effaced).  You will be tested for Group B streptococcus. This happens between 35 and 37 weeks.  Your health care provider may ask you:  What your birth plan is.  How you are feeling.  If you are feeling the baby move.  If you have had   any abnormal symptoms, such as leaking fluid, bleeding, severe headaches, or abdominal cramping.  If you are using any tobacco products, including cigarettes, chewing tobacco, and electronic cigarettes.  If you have any questions.  Other tests or screenings that may be performed during your third trimester include:  Blood tests that check for low iron levels (anemia).  Fetal testing to check the health, activity level, and growth of the fetus. Testing is done if you have certain medical conditions or if there are problems during the  pregnancy.  Nonstress test (NST). This test checks the health of your baby to make sure there are no signs of problems, such as the baby not getting enough oxygen. During this test, a belt is placed around your belly. The baby is made to move, and its heart rate is monitored during movement.  What is false labor? False labor is a condition in which you feel small, irregular tightenings of the muscles in the womb (contractions) that usually go away with rest, changing position, or drinking water. These are called Braxton Hicks contractions. Contractions may last for hours, days, or even weeks before true labor sets in. If contractions come at regular intervals, become more frequent, increase in intensity, or become painful, you should see your health care provider. What are the signs of labor?  Abdominal cramps.  Regular contractions that start at 10 minutes apart and become stronger and more frequent with time.  Contractions that start on the top of the uterus and spread down to the lower abdomen and back.  Increased pelvic pressure and dull back pain.  A watery or bloody mucus discharge that comes from the vagina.  Leaking of amniotic fluid. This is also known as your "water breaking." It could be a slow trickle or a gush. Let your health care provider know if it has a color or strange odor. If you have any of these signs, call your health care provider right away, even if it is before your due date. Follow these instructions at home: Medicines  Follow your health care provider's instructions regarding medicine use. Specific medicines may be either safe or unsafe to take during pregnancy.  Take a prenatal vitamin that contains at least 600 micrograms (mcg) of folic acid.  If you develop constipation, try taking a stool softener if your health care provider approves. Eating and drinking  Eat a balanced diet that includes fresh fruits and vegetables, whole grains, good sources of protein  such as meat, eggs, or tofu, and low-fat dairy. Your health care provider will help you determine the amount of weight gain that is right for you.  Avoid raw meat and uncooked cheese. These carry germs that can cause birth defects in the baby.  If you have low calcium intake from food, talk to your health care provider about whether you should take a daily calcium supplement.  Eat four or five small meals rather than three large meals a day.  Limit foods that are high in fat and processed sugars, such as fried and sweet foods.  To prevent constipation: ? Drink enough fluid to keep your urine clear or pale yellow. ? Eat foods that are high in fiber, such as fresh fruits and vegetables, whole grains, and beans. Activity  Exercise only as directed by your health care provider. Most women can continue their usual exercise routine during pregnancy. Try to exercise for 30 minutes at least 5 days a week. Stop exercising if you experience uterine contractions.  Avoid heavy   lifting.  Do not exercise in extreme heat or humidity, or at high altitudes.  Wear low-heel, comfortable shoes.  Practice good posture.  You may continue to have sex unless your health care provider tells you otherwise. Relieving pain and discomfort  Take frequent breaks and rest with your legs elevated if you have leg cramps or low back pain.  Take warm sitz baths to soothe any pain or discomfort caused by hemorrhoids. Use hemorrhoid cream if your health care provider approves.  Wear a good support bra to prevent discomfort from breast tenderness.  If you develop varicose veins: ? Wear support pantyhose or compression stockings as told by your healthcare provider. ? Elevate your feet for 15 minutes, 3-4 times a day. Prenatal care  Write down your questions. Take them to your prenatal visits.  Keep all your prenatal visits as told by your health care provider. This is important. Safety  Wear your seat belt at  all times when driving.  Make a list of emergency phone numbers, including numbers for family, friends, the hospital, and police and fire departments. General instructions  Avoid cat litter boxes and soil used by cats. These carry germs that can cause birth defects in the baby. If you have a cat, ask someone to clean the litter box for you.  Do not travel far distances unless it is absolutely necessary and only with the approval of your health care provider.  Do not use hot tubs, steam rooms, or saunas.  Do not drink alcohol.  Do not use any products that contain nicotine or tobacco, such as cigarettes and e-cigarettes. If you need help quitting, ask your health care provider.  Do not use any medicinal herbs or unprescribed drugs. These chemicals affect the formation and growth of the baby.  Do not douche or use tampons or scented sanitary pads.  Do not cross your legs for long periods of time.  To prepare for the arrival of your baby: ? Take prenatal classes to understand, practice, and ask questions about labor and delivery. ? Make a trial run to the hospital. ? Visit the hospital and tour the maternity area. ? Arrange for maternity or paternity leave through employers. ? Arrange for family and friends to take care of pets while you are in the hospital. ? Purchase a rear-facing car seat and make sure you know how to install it in your car. ? Pack your hospital bag. ? Prepare the baby's nursery. Make sure to remove all pillows and stuffed animals from the baby's crib to prevent suffocation.  Visit your dentist if you have not gone during your pregnancy. Use a soft toothbrush to brush your teeth and be gentle when you floss. Contact a health care provider if:  You are unsure if you are in labor or if your water has broken.  You become dizzy.  You have mild pelvic cramps, pelvic pressure, or nagging pain in your abdominal area.  You have lower back pain.  You have persistent  nausea, vomiting, or diarrhea.  You have an unusual or bad smelling vaginal discharge.  You have pain when you urinate. Get help right away if:  Your water breaks before 37 weeks.  You have regular contractions less than 5 minutes apart before 37 weeks.  You have a fever.  You are leaking fluid from your vagina.  You have spotting or bleeding from your vagina.  You have severe abdominal pain or cramping.  You have rapid weight loss or weight gain.    You have shortness of breath with chest pain.  You notice sudden or extreme swelling of your face, hands, ankles, feet, or legs.  Your baby makes fewer than 10 movements in 2 hours.  You have severe headaches that do not go away when you take medicine.  You have vision changes. Summary  The third trimester is from week 28 through week 40, months 7 through 9. The third trimester is a time when the unborn baby (fetus) is growing rapidly.  During the third trimester, your discomfort may increase as you and your baby continue to gain weight. You may have abdominal, leg, and back pain, sleeping problems, and an increased need to urinate.  During the third trimester your breasts will keep growing and they will continue to become tender. A yellow fluid (colostrum) may leak from your breasts. This is the first milk you are producing for your baby.  False labor is a condition in which you feel small, irregular tightenings of the muscles in the womb (contractions) that eventually go away. These are called Braxton Hicks contractions. Contractions may last for hours, days, or even weeks before true labor sets in.  Signs of labor can include: abdominal cramps; regular contractions that start at 10 minutes apart and become stronger and more frequent with time; watery or bloody mucus discharge that comes from the vagina; increased pelvic pressure and dull back pain; and leaking of amniotic fluid. This information is not intended to replace advice  given to you by your health care provider. Make sure you discuss any questions you have with your health care provider. Document Released: 06/11/2001 Document Revised: 11/23/2015 Document Reviewed: 08/18/2012 Elsevier Interactive Patient Education  2017 [employer]  

## 2017-01-03 LAB — CBC
HEMATOCRIT: 32.9 % — AB (ref 34.0–46.6)
HEMOGLOBIN: 11 g/dL — AB (ref 11.1–15.9)
MCH: 31 pg (ref 26.6–33.0)
MCHC: 33.4 g/dL (ref 31.5–35.7)
MCV: 93 fL (ref 79–97)
Platelets: 195 10*3/uL (ref 150–379)
RBC: 3.55 x10E6/uL — AB (ref 3.77–5.28)
RDW: 14 % (ref 12.3–15.4)
WBC: 13.6 10*3/uL — ABNORMAL HIGH (ref 3.4–10.8)

## 2017-01-03 LAB — GLUCOSE TOLERANCE, 2 HOURS W/ 1HR
GLUCOSE, FASTING: 78 mg/dL (ref 65–91)
Glucose, 1 hour: 108 mg/dL (ref 65–179)
Glucose, 2 hour: 56 mg/dL — ABNORMAL LOW (ref 65–152)

## 2017-01-03 LAB — RPR: RPR: NONREACTIVE

## 2017-01-03 LAB — HIV ANTIBODY (ROUTINE TESTING W REFLEX): HIV Screen 4th Generation wRfx: NONREACTIVE

## 2017-01-17 ENCOUNTER — Encounter: Payer: Medicaid Other | Admitting: Certified Nurse Midwife

## 2017-01-22 IMAGING — US US OB TRANSVAGINAL
1 series · 15 of 28 positions shown · non-contrast
Comparison: Pelvic ultrasound dated 03/25/2016

CLINICAL DATA: 20-year-old female with left lower quadrant
abdominal pain.

EXAM:
ULTRASOUND PELVIS TRANSVAGINAL
TECHNIQUE: Transvaginal ultrasound examination of the pelvis was performed
including evaluation of the uterus, ovaries, adnexal regions, and
pelvic cul-de-sac.

[Series 1: us ob transvaginal · 15 of 31 slices shown]
[im 1/31]
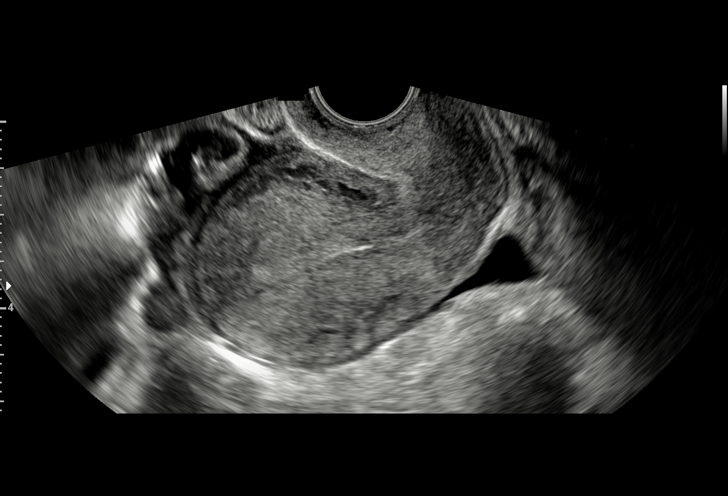
[im 3/31]
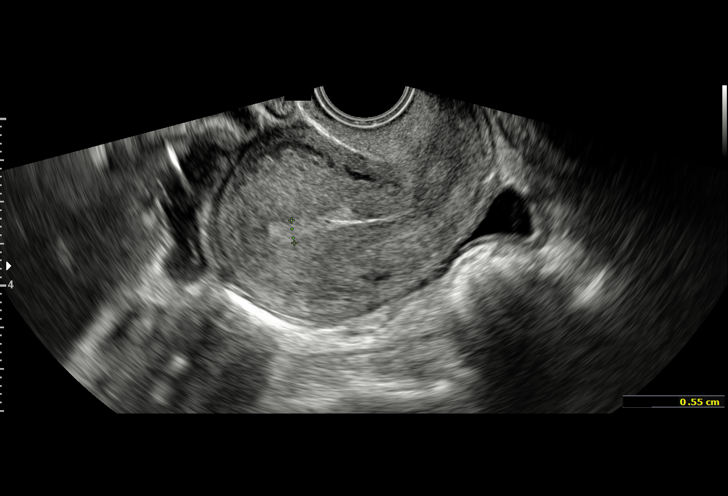
[im 5/31]
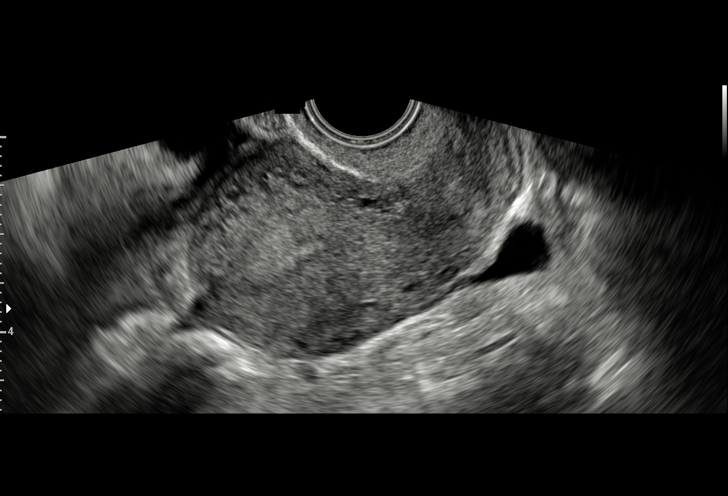
[im 7/31]
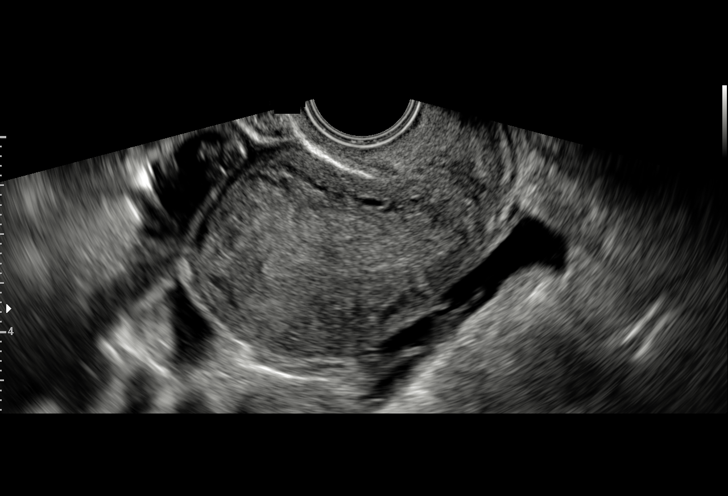
[im 9/31]
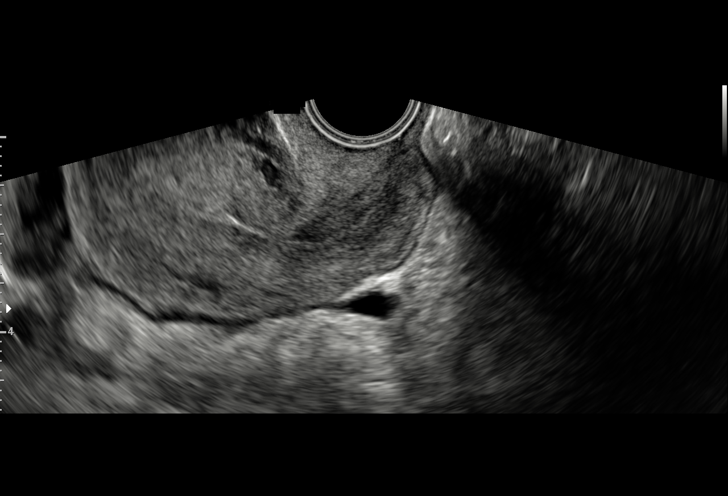
[im 12/31]
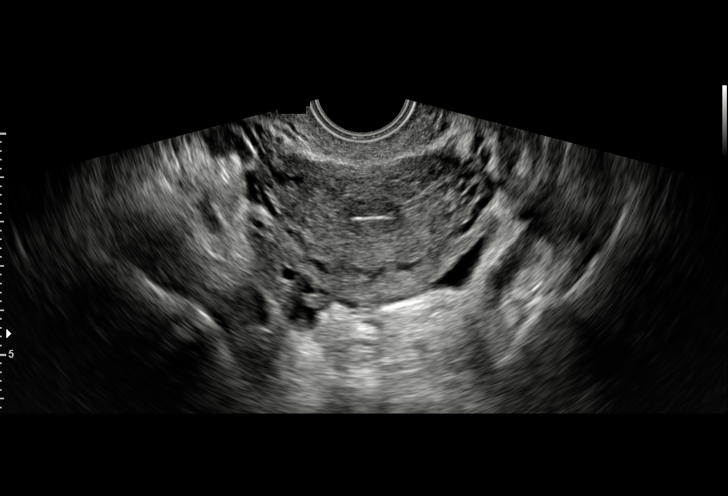
[im 14/31]
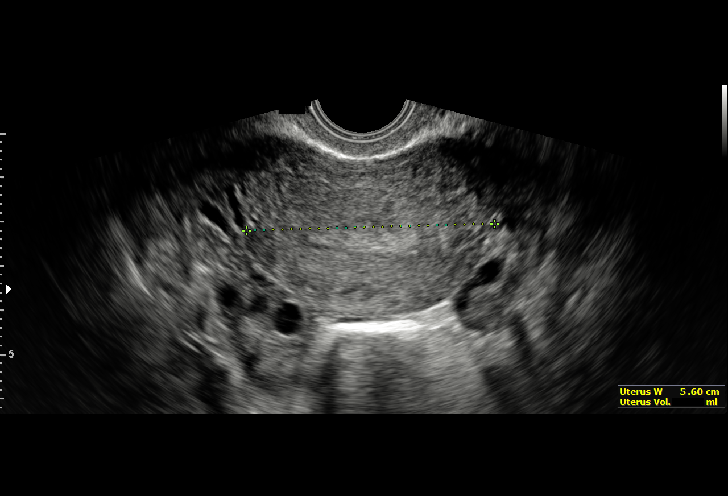
[im 16/31]
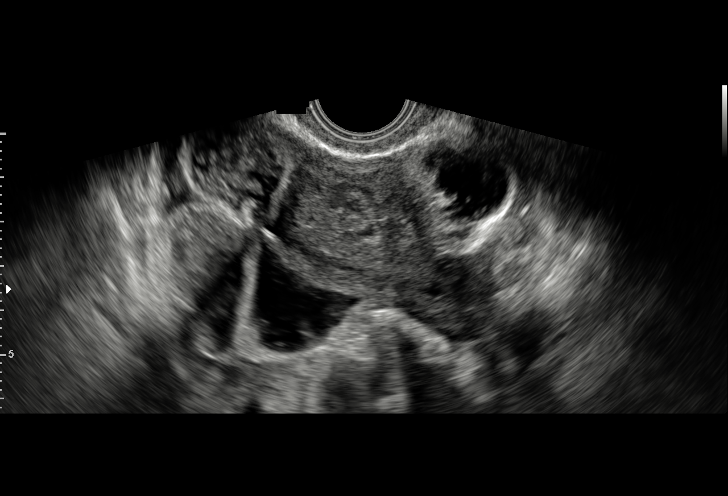
[im 17/31]
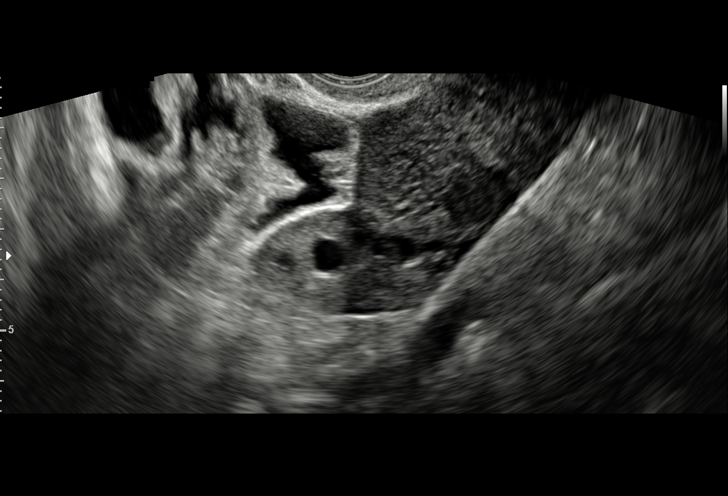
[im 19/31]
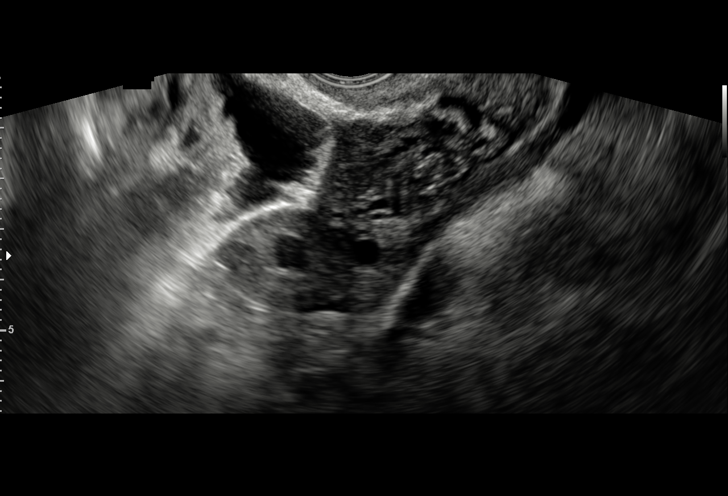
[im 22/31]
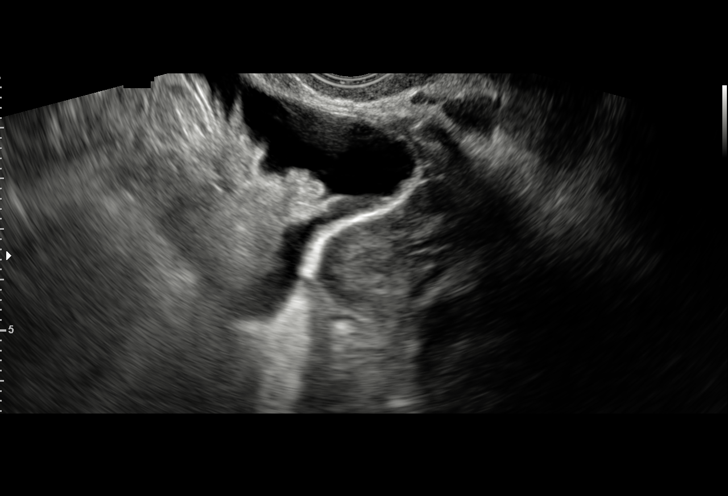
[im 24/31]
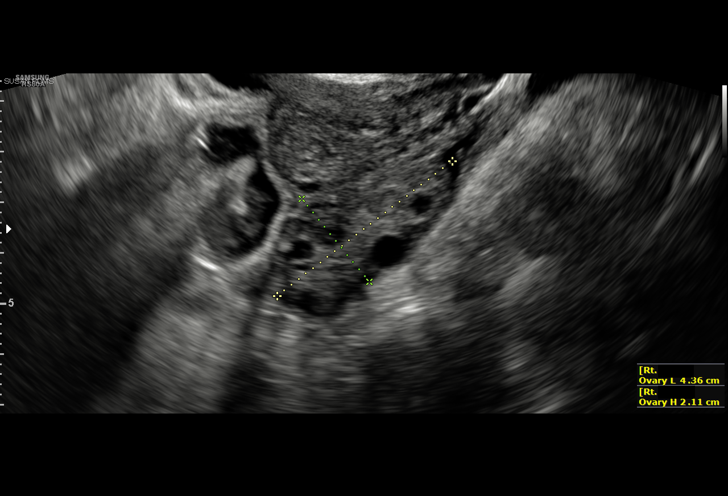
[im 26/31]
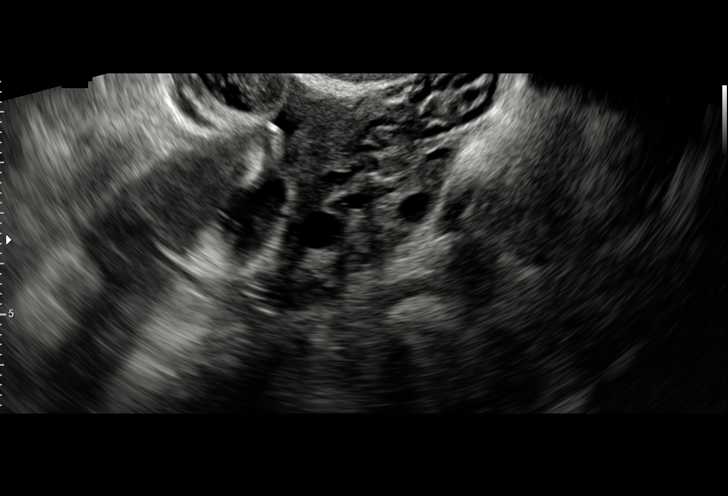
[im 28/31]
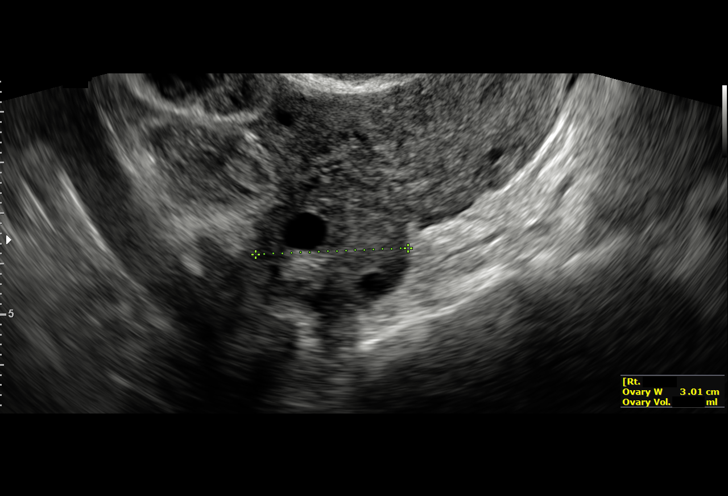
[im 31/31]
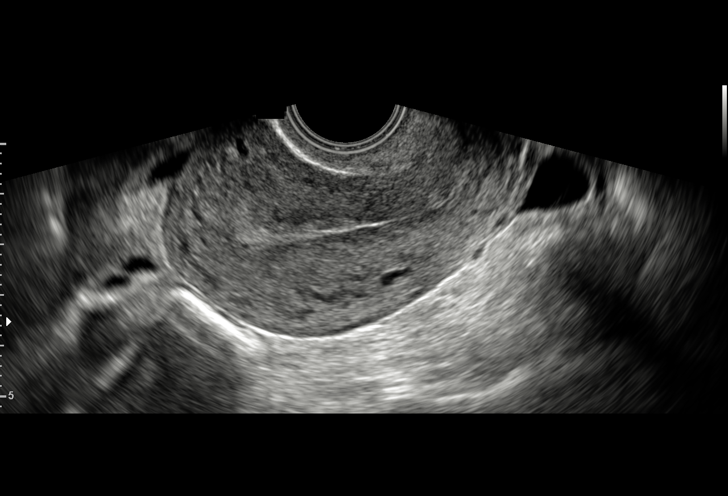

[15 of 28 positions shown; findings below may reference images not displayed]

FINDINGS: Uterus

Measurements: 7.6 x 4.3 x 5.6 cm. No fibroids or other mass
visualized.

Endometrium

Thickness: 6 mm.  No focal abnormality visualized.

Right ovary

Measurements: 4.4 x 2.0 x 3.0 cm. Normal appearance/no adnexal mass.

Left ovary

Measurements: 3.8 x 2.2 x 2.6 cm. Normal appearance/no adnexal mass.

Other findings:  Small free fluid within the pelvis.
IMPRESSION: Unremarkable pelvic ultrasound.

The IUP seen on the ultrasound dated 03/25/2016 is not seen on the
current ultrasound.

## 2017-01-23 ENCOUNTER — Telehealth: Payer: Self-pay

## 2017-01-23 NOTE — Telephone Encounter (Signed)
Returned call and pt stated that she has missed a few appts due to transportation issues, and because she has been feeling cramps that come and go. Pt reports good fetal movement, denies bleeding. Advised pt that it is important to come to next appt, and advised of signs of preterm labor and [facility], pt agreed.

## 2017-01-28 ENCOUNTER — Other Ambulatory Visit (HOSPITAL_COMMUNITY)
Admission: RE | Admit: 2017-01-28 | Discharge: 2017-01-28 | Disposition: A | Discharge status: Home / Self Care | Payer: Medicaid Other | Source: Ambulatory Visit | Attending: Certified Nurse Midwife | Admitting: Certified Nurse Midwife

## 2017-01-28 ENCOUNTER — Ambulatory Visit (INDEPENDENT_AMBULATORY_CARE_PROVIDER_SITE_OTHER): Payer: Medicaid Other | Admitting: Certified Nurse Midwife

## 2017-01-28 ENCOUNTER — Encounter: Payer: Self-pay | Admitting: Certified Nurse Midwife

## 2017-01-28 VITALS — BP 125/86 | HR 63 | Wt 195.0 lb

## 2017-01-28 DIAGNOSIS — A5901 Trichomonal vulvovaginitis: Secondary | ICD-10-CM | POA: Insufficient documentation

## 2017-01-28 DIAGNOSIS — Z3480 Encounter for supervision of other normal pregnancy, unspecified trimester: Secondary | ICD-10-CM | POA: Diagnosis present

## 2017-01-28 DIAGNOSIS — B373 Candidiasis of vulva and vagina: Secondary | ICD-10-CM | POA: Insufficient documentation

## 2017-01-28 DIAGNOSIS — B3731 Acute candidiasis of vulva and vagina: Secondary | ICD-10-CM

## 2017-01-28 DIAGNOSIS — Z3483 Encounter for supervision of other normal pregnancy, third trimester: Secondary | ICD-10-CM

## 2017-01-28 DIAGNOSIS — Z3490 Encounter for supervision of normal pregnancy, unspecified, unspecified trimester: Secondary | ICD-10-CM | POA: Insufficient documentation

## 2017-01-28 DIAGNOSIS — Z8781 Personal history of (healed) traumatic fracture: Secondary | ICD-10-CM | POA: Insufficient documentation

## 2017-01-28 DIAGNOSIS — O99333 Smoking (tobacco) complicating pregnancy, third trimester: Secondary | ICD-10-CM

## 2017-01-28 DIAGNOSIS — O0933 Supervision of pregnancy with insufficient antenatal care, third trimester: Secondary | ICD-10-CM

## 2017-01-28 DIAGNOSIS — O99330 Smoking (tobacco) complicating pregnancy, unspecified trimester: Secondary | ICD-10-CM

## 2017-01-28 DIAGNOSIS — O280 Abnormal hematological finding on antenatal screening of mother: Secondary | ICD-10-CM

## 2017-01-28 MED ORDER — FLUCONAZOLE 150 MG PO TABS: 150.0000 mg | ORAL_TABLET | Freq: Once | ORAL | 0 refills | Status: AC

## 2017-01-28 MED ORDER — TERCONAZOLE 0.8 % VA CREA: 1.0000 | TOPICAL_CREAM | Freq: Every day | VAGINAL | 0 refills | Status: DC

## 2017-01-28 NOTE — Addendum Note (Signed)
Addended by: Natale MilchSTALLING, BRITTANY D on: 01/28/2017 03:27 PM   Modules accepted: Orders

## 2017-01-28 NOTE — Progress Notes (Signed)
ROB GBS 

## 2017-01-28 NOTE — Progress Notes (Signed)
   PRENATAL VISIT NOTE  Subjective:  Jenna [name] is a 21 y.o. G3P0020 at 5557w1d being seen today for ongoing prenatal care.  She is currently monitored for the following issues for this low-risk pregnancy and has Supervision of other normal pregnancy, antepartum; Abnormal maternal serum screening test; Tobacco smoking affecting pregnancy, antepartum; Insufficient prenatal care in third trimester; and Social problem on her problem list.  Patient reports backache, no bleeding, no contractions, no cramping and no leaking.  Contractions: Irregular. Vag. Bleeding: None.  Movement: Present. Denies leaking of fluid.   The following portions of the patient's history were reviewed and updated as appropriate: allergies, current medications, past family history, past medical history, past social history, past surgical history and problem list. Problem list updated.  Objective:   Vitals:   01/28/17 1319  BP: 125/86  Pulse: 63  Weight: 195 lb (88.5 kg)    Fetal Status: Fetal Heart Rate (bpm): 142 Fundal Height: 36 cm Movement: Present  Presentation: Vertex  General:  Alert, oriented and cooperative. Patient is in no acute distress.  Skin: Skin is warm and dry. No rash noted.   Cardiovascular: Normal heart rate noted  Respiratory: Normal respiratory effort, no problems with respiration noted  Abdomen: Soft, gravid, appropriate for gestational age.  Pain/Pressure: Present     Pelvic: Cervical exam performed Dilation: 1 Effacement (%): 0 Station: Ballotable  Extremities: Normal range of motion.  Edema: None  Mental Status:  Normal mood and affect. Normal behavior. Normal judgment and thought content.   Assessment and Plan:  Pregnancy: G3P0020 at 2657w1d  1. Encounter for supervision of normal pregnancy, antepartum, unspecified gravidity      Hx of pelvic fracture, no hardware.   2. Supervision of other normal pregnancy, antepartum      Yeast vaginitis on exam: diflucan/terazole cream ordered.     3. Insufficient prenatal care in third trimester     3 total prenatal visits.    4. Tobacco smoking affecting pregnancy, antepartum        Preterm labor symptoms and general obstetric precautions including but not limited to vaginal bleeding, contractions, leaking of fluid and fetal movement were reviewed in detail with the patient. Please refer to After Visit Summary for other counseling recommendations.  Return in about 1 week (around 02/04/2017) for ROB.   Roe Coombsachelle A Joselyne Spake, CNM

## 2017-01-30 LAB — STREP GP B NAA: Strep Gp B NAA: POSITIVE — AB

## 2017-01-31 ENCOUNTER — Other Ambulatory Visit: Payer: Self-pay | Admitting: Certified Nurse Midwife

## 2017-01-31 DIAGNOSIS — A5901 Trichomonal vulvovaginitis: Secondary | ICD-10-CM | POA: Insufficient documentation

## 2017-01-31 DIAGNOSIS — Z3480 Encounter for supervision of other normal pregnancy, unspecified trimester: Secondary | ICD-10-CM

## 2017-01-31 DIAGNOSIS — O23593 Infection of other part of genital tract in pregnancy, third trimester: Principal | ICD-10-CM

## 2017-01-31 DIAGNOSIS — O99820 Streptococcus B carrier state complicating pregnancy: Secondary | ICD-10-CM

## 2017-01-31 LAB — CERVICOVAGINAL ANCILLARY ONLY
Bacterial vaginitis: NEGATIVE
Candida vaginitis: POSITIVE — AB
Chlamydia: NEGATIVE
Neisseria Gonorrhea: NEGATIVE
Trichomonas: POSITIVE — AB

## 2017-01-31 MED ORDER — METRONIDAZOLE 500 MG PO TABS: 2000.0000 mg | ORAL_TABLET | Freq: Once | ORAL | 0 refills | Status: AC

## 2017-02-03 ENCOUNTER — Telehealth: Payer: Self-pay

## 2017-02-03 NOTE — Telephone Encounter (Signed)
Unable to leave message.Telephone does not accept incoming calls.

## 2017-02-03 NOTE — Telephone Encounter (Signed)
-----   Message from Roe Coombsachelle A Denney, CNM sent at 01/31/2017 11:53 AM EDT ----- Please let her know about her Positive GBS status and if she has any questions.  Thank you.   Please call patient and notify of +Trich results. Metrondiazole 2000 mg PO.    Please also ask her if she will need anything for yeast.  Please schedule her a TOC with an ROB in 4-6 weeks.  Please tell her to have her partner treated as well.    Thank you, Orvilla Cornwallachelle Denney CNM

## 2017-02-04 ENCOUNTER — Encounter: Payer: Medicaid Other | Admitting: Certified Nurse Midwife

## 2017-02-11 ENCOUNTER — Encounter: Payer: Medicaid Other | Admitting: Certified Nurse Midwife

## 2017-02-16 ENCOUNTER — Encounter (HOSPITAL_COMMUNITY): Payer: Self-pay | Admitting: Advanced Practice Midwife

## 2017-02-16 ENCOUNTER — Inpatient Hospital Stay (HOSPITAL_COMMUNITY)
Admission: AD | Admit: 2017-02-16 | Discharge: 2017-02-16 | Disposition: A | Discharge status: Home / Self Care | Payer: Medicaid Other | Source: Ambulatory Visit | Attending: Obstetrics & Gynecology | Admitting: Obstetrics & Gynecology

## 2017-02-16 DIAGNOSIS — O479 False labor, unspecified: Secondary | ICD-10-CM | POA: Diagnosis not present

## 2017-02-16 DIAGNOSIS — O26893 Other specified pregnancy related conditions, third trimester: Secondary | ICD-10-CM | POA: Insufficient documentation

## 2017-02-16 DIAGNOSIS — Z3A39 39 weeks gestation of pregnancy: Secondary | ICD-10-CM | POA: Insufficient documentation

## 2017-02-16 DIAGNOSIS — K5900 Constipation, unspecified: Secondary | ICD-10-CM | POA: Diagnosis not present

## 2017-02-16 DIAGNOSIS — O471 False labor at or after 37 completed weeks of gestation: Secondary | ICD-10-CM | POA: Diagnosis present

## 2017-02-16 DIAGNOSIS — O99613 Diseases of the digestive system complicating pregnancy, third trimester: Secondary | ICD-10-CM

## 2017-02-16 MED ORDER — POLYETHYLENE GLYCOL 3350 17 GM/SCOOP PO POWD: 17.0000 g | Freq: Two times a day (BID) | ORAL | 2 refills | Status: DC | PRN

## 2017-02-16 NOTE — MAU Note (Signed)
Pt here with c/o shooting sharp vaginal pain. Some abdominal cramping as well. Denies any bleeding but having some leaking. Reports good fetal movement.

## 2017-02-16 NOTE — MAU Provider Note (Signed)
CC:  Chief Complaint  Patient presents with  . Pelvic Pain    HPI: Jenna [name] is a 21 y.o. year old G36P0020 female at [redacted]w[redacted]d weeks gestation who presents to MAU reporting possible contractions and sharp vaginal pains. 1 cm per RN exam (no change from prev exam), but asked to be seen by CNM 2/2 sharp vaginal pain.   Associated Sx:  Vaginal bleeding: Denies Leaking of fluid: Denies Fetal movement: Nml  Describes mild, irreg sharp vaginal pains that correspond w/ fetal mvmt. Also C/O constipation. Hasn't tried anything except increasing fluids.   O: Patient Vitals for the past 24 hrs:  BP Temp Temp src Pulse Resp SpO2 Height Weight  02/16/17 2055 125/70 98.2 F (36.8 C) Oral 74 18 100 % 5\' 5"  (1.651 m) 205 lb (93 kg)    General: NAD Heart: Regular rate Lungs: Normal rate and effort Abd: Soft, NT, Gravid, S=D Pelvic: NEFG, Neg pooling, Neg  blood.  Dilation: 1 Effacement (%): 50 Cervical Position: Posterior Station: -3 Presentation: Vertex Exam by:: Kayren Eaves RN   EFM: 125, Moderate variability, 15 x 15 accelerations, no decelerations Toco: UI  A: [redacted]w[redacted]d week IUP Braxton Hicks Constipation Mild pain associated w/ fetal mvmt. No evidence of emergent condition.  FHR reactive  P: Discharge home in stable condition. Labor labor precautions and fetal kick counts. Follow-up as scheduled for prenatal visit or sooner as needed if symptoms worsen. Return to maternity admissions as needed if symptoms worsen.  [place], CNM 06/24/2016 11:40 PM  3

## 2017-02-16 NOTE — Discharge Instructions (Signed)
Braxton Hicks Contractions °Contractions of the uterus can occur throughout pregnancy, but they are not always a sign that you are in labor. You may have practice contractions called Braxton Hicks contractions. These false labor contractions are sometimes confused with true labor. °What are Braxton Hicks contractions? °Braxton Hicks contractions are tightening movements that occur in the muscles of the uterus before labor. Unlike true labor contractions, these contractions do not result in opening (dilation) and thinning of the cervix. Toward the end of pregnancy (32-34 weeks), Braxton Hicks contractions can happen more often and may become stronger. These contractions are sometimes difficult to tell apart from true labor because they can be very uncomfortable. You should not feel embarrassed if you go to the hospital with false labor. °Sometimes, the only way to tell if you are in true labor is for your health care provider to look for changes in the cervix. The health care provider will do a physical exam and may monitor your contractions. If you are not in true labor, the exam should show that your cervix is not dilating and your water has not broken. °If there are no prenatal problems or other health problems associated with your pregnancy, it is completely safe for you to be sent home with false labor. You may continue to have Braxton Hicks contractions until you go into true labor. °How can I tell the difference between true labor and false labor? °· Differences °? False labor °? Contractions last 30-70 seconds.: Contractions are usually shorter and not as strong as true labor contractions. °? Contractions become very regular.: Contractions are usually irregular. °? Discomfort is usually felt in the top of the uterus, and it spreads to the lower abdomen and low back.: Contractions are often felt in the front of the lower abdomen and in the groin. °? Contractions do not go away with walking.: Contractions may  go away when you walk around or change positions while lying down. °? Contractions usually become more intense and increase in frequency.: Contractions get weaker and are shorter-lasting as time goes on. °? The cervix dilates and gets thinner.: The cervix usually does not dilate or become thin. °Follow these instructions at home: °· Take over-the-counter and prescription medicines only as told by your health care provider. °· Keep up with your usual exercises and follow other instructions from your health care provider. °· Eat and drink lightly if you think you are going into labor. °· If Braxton Hicks contractions are making you uncomfortable: °? Change your position from lying down or resting to walking, or change from walking to resting. °? Sit and rest in a tub of warm water. °? Drink enough fluid to keep your urine clear or pale yellow. Dehydration may cause these contractions. °? Do slow and deep breathing several times an hour. °· Keep all follow-up prenatal visits as told by your health care provider. This is important. °Contact a health care provider if: °· You have a fever. °· You have continuous pain in your abdomen. °Get help right away if: °· Your contractions become stronger, more regular, and closer together. °· You have fluid leaking or gushing from your vagina. °· You pass blood-tinged mucus (bloody show). °· You have bleeding from your vagina. °· You have low back pain that you never had before. °· You feel your baby’s head pushing down and causing pelvic pressure. °· Your baby is not moving inside you as much as it used to. °Summary °· Contractions that occur before labor are   called Braxton Hicks contractions, false labor, or practice contractions.  Braxton Hicks contractions are usually shorter, weaker, farther apart, and less regular than true labor contractions. True labor contractions usually become progressively stronger and regular and they become more frequent.  Manage discomfort from  [facility] contractions by changing position, resting in a warm bath, drinking plenty of water, or practicing deep breathing. This information is not intended to replace advice given to you by your health care provider. Make sure you discuss any questions you have with your health care provider. Document Released: 06/17/2005 Document Revised: 05/06/2016 Document Reviewed: 05/06/2016 Elsevier Interactive Patient Education  2017 [employer].   Constipation, Adult Constipation is when a person has fewer bowel movements in a week than normal, has difficulty having a bowel movement, or has stools that are dry, hard, or larger than normal. Constipation may be caused by an underlying condition. It may become worse with age if a person takes certain medicines and does not take in enough fluids. Follow these instructions at home: Eating and drinking   Eat foods that have a lot of fiber, such as fresh fruits and vegetables, whole grains, and beans.  Limit foods that are high in fat, low in fiber, or overly processed, such as french fries, hamburgers, cookies, candies, and soda.  Drink enough fluid to keep your urine clear or pale yellow. General instructions  Exercise regularly or as told by your health care provider.  Go to the restroom when you have the urge to go. Do not hold it in.  Take over-the-counter and prescription medicines only as told by your health care provider. These include any fiber supplements.  Practice pelvic floor retraining exercises, such as deep breathing while relaxing the lower abdomen and pelvic floor relaxation during bowel movements.  Watch your condition for any changes.  Keep all follow-up visits as told by your health care provider. This is important. Contact a health care provider if:  You have pain that gets worse.  You have a fever.  You do not have a bowel movement after 4 days.  You vomit.  You are not hungry.  You lose weight.  You are  bleeding from the anus.  You have thin, pencil-like stools. Get help right away if:  You have a fever and your symptoms suddenly get worse.  You leak stool or have blood in your stool.  Your abdomen is bloated.  You have severe pain in your abdomen.  You feel dizzy or you faint. This information is not intended to replace advice given to you by your health care provider. Make sure you discuss any questions you have with your health care provider. Document Released: 03/15/2004 Document Revised: 01/05/2016 Document Reviewed: 12/06/2015 Elsevier Interactive Patient Education  2017 [employer].

## 2017-02-18 ENCOUNTER — Encounter: Payer: Medicaid Other | Admitting: Certified Nurse Midwife

## 2017-02-19 ENCOUNTER — Telehealth: Payer: Self-pay

## 2017-02-19 NOTE — Telephone Encounter (Signed)
-----   Message from Roe Coombs, CNM sent at 01/31/2017 11:53 AM EDT ----- Please let her know about her Positive GBS status and if she has any questions.  Thank you.   Please call patient and notify of +Trich results. Metrondiazole 2000 mg PO.    Please also ask her if she will need anything for yeast.  Please schedule her a TOC with an ROB in 4-6 weeks.  Please tell her to have her partner treated as well.    Thank you, Orvilla Cornwall CNM

## 2017-02-19 NOTE — Telephone Encounter (Signed)
Patient notified of results, Rx and need for partner to be treated. She was transferred to Check-in the rescheduled her missed OB visit.

## 2017-02-20 ENCOUNTER — Inpatient Hospital Stay (HOSPITAL_COMMUNITY): Payer: Medicaid Other | Admitting: Anesthesiology

## 2017-02-20 ENCOUNTER — Inpatient Hospital Stay (HOSPITAL_COMMUNITY)
Admission: AD | Admit: 2017-02-20 | Discharge: 2017-02-24 | DRG: 765 | Disposition: A | Discharge status: Home / Self Care | Payer: Medicaid Other | Source: Ambulatory Visit | Attending: Obstetrics & Gynecology | Admitting: Obstetrics & Gynecology

## 2017-02-20 ENCOUNTER — Encounter (HOSPITAL_COMMUNITY): Payer: Self-pay

## 2017-02-20 DIAGNOSIS — O26893 Other specified pregnancy related conditions, third trimester: Secondary | ICD-10-CM | POA: Diagnosis present

## 2017-02-20 DIAGNOSIS — F17210 Nicotine dependence, cigarettes, uncomplicated: Secondary | ICD-10-CM | POA: Diagnosis present

## 2017-02-20 DIAGNOSIS — D649 Anemia, unspecified: Secondary | ICD-10-CM | POA: Diagnosis present

## 2017-02-20 DIAGNOSIS — O99824 Streptococcus B carrier state complicating childbirth: Secondary | ICD-10-CM | POA: Diagnosis present

## 2017-02-20 DIAGNOSIS — O134 Gestational [pregnancy-induced] hypertension without significant proteinuria, complicating childbirth: Secondary | ICD-10-CM | POA: Diagnosis present

## 2017-02-20 DIAGNOSIS — Z3A39 39 weeks gestation of pregnancy: Secondary | ICD-10-CM | POA: Diagnosis not present

## 2017-02-20 DIAGNOSIS — A5901 Trichomonal vulvovaginitis: Secondary | ICD-10-CM | POA: Diagnosis present

## 2017-02-20 DIAGNOSIS — Z23 Encounter for immunization: Secondary | ICD-10-CM

## 2017-02-20 DIAGNOSIS — O99334 Smoking (tobacco) complicating childbirth: Secondary | ICD-10-CM | POA: Diagnosis present

## 2017-02-20 DIAGNOSIS — Z98891 History of uterine scar from previous surgery: Secondary | ICD-10-CM

## 2017-02-20 DIAGNOSIS — O9902 Anemia complicating childbirth: Secondary | ICD-10-CM | POA: Diagnosis present

## 2017-02-20 DIAGNOSIS — O9832 Other infections with a predominantly sexual mode of transmission complicating childbirth: Secondary | ICD-10-CM | POA: Diagnosis present

## 2017-02-20 LAB — CBC
HEMATOCRIT: 34.3 % — AB (ref 36.0–46.0)
HEMOGLOBIN: 11.8 g/dL — AB (ref 12.0–15.0)
MCH: 31.1 pg (ref 26.0–34.0)
MCHC: 34.4 g/dL (ref 30.0–36.0)
MCV: 90.3 fL (ref 78.0–100.0)
Platelets: 191 10*3/uL (ref 150–400)
RBC: 3.8 MIL/uL — ABNORMAL LOW (ref 3.87–5.11)
RDW: 13.8 % (ref 11.5–15.5)
WBC: 15.3 10*3/uL — AB (ref 4.0–10.5)

## 2017-02-20 LAB — TYPE AND SCREEN
ABO/RH(D): O POS
ANTIBODY SCREEN: NEGATIVE

## 2017-02-20 LAB — URINALYSIS, ROUTINE W REFLEX MICROSCOPIC
Bilirubin Urine: NEGATIVE
Glucose, UA: NEGATIVE mg/dL
Hgb urine dipstick: NEGATIVE
Ketones, ur: NEGATIVE mg/dL
Nitrite: NEGATIVE
PROTEIN: NEGATIVE mg/dL
SPECIFIC GRAVITY, URINE: 1.018 (ref 1.005–1.030)
pH: 7 (ref 5.0–8.0)

## 2017-02-20 LAB — POCT FERN TEST: POCT Fern Test: POSITIVE

## 2017-02-20 LAB — COMPREHENSIVE METABOLIC PANEL
ALK PHOS: 101 U/L (ref 38–126)
ALT: 9 U/L — ABNORMAL LOW (ref 14–54)
ANION GAP: 10 (ref 5–15)
AST: 19 U/L (ref 15–41)
Albumin: 3.2 g/dL — ABNORMAL LOW (ref 3.5–5.0)
BUN: 7 mg/dL (ref 6–20)
CALCIUM: 9 mg/dL (ref 8.9–10.3)
CO2: 21 mmol/L — AB (ref 22–32)
Chloride: 105 mmol/L (ref 101–111)
Creatinine, Ser: 0.54 mg/dL (ref 0.44–1.00)
Glucose, Bld: 82 mg/dL (ref 65–99)
Potassium: 4.1 mmol/L (ref 3.5–5.1)
SODIUM: 136 mmol/L (ref 135–145)
Total Bilirubin: 0.6 mg/dL (ref 0.3–1.2)
Total Protein: 6.9 g/dL (ref 6.5–8.1)

## 2017-02-20 LAB — PROTEIN / CREATININE RATIO, URINE
CREATININE, URINE: 109 mg/dL
PROTEIN CREATININE RATIO: 0.15 mg/mg{creat} (ref 0.00–0.15)
TOTAL PROTEIN, URINE: 16 mg/dL

## 2017-02-20 LAB — ABO/RH: ABO/RH(D): O POS

## 2017-02-20 MED ORDER — FLEET ENEMA 7-19 GM/118ML RE ENEM: 1.0000 | ENEMA | RECTAL | Status: DC | PRN

## 2017-02-20 MED ORDER — BUPIVACAINE HCL (PF) 0.25 % IJ SOLN
INTRAMUSCULAR | Status: DC | PRN
  Administered 2017-02-20: 10 mL via EPIDURAL

## 2017-02-20 MED ORDER — LACTATED RINGERS IV SOLN: 500.0000 mL | INTRAVENOUS | Status: DC | PRN

## 2017-02-20 MED ORDER — FENTANYL CITRATE (PF) 100 MCG/2ML IJ SOLN
100.0000 ug | INTRAMUSCULAR | Status: DC | PRN
  Administered 2017-02-20 – 2017-02-21 (×3): 100 ug via INTRAVENOUS
  Filled 2017-02-20 (×3): qty 2

## 2017-02-20 MED ORDER — OXYCODONE-ACETAMINOPHEN 5-325 MG PO TABS: 2.0000 | ORAL_TABLET | ORAL | Status: DC | PRN

## 2017-02-20 MED ORDER — SOD CITRATE-CITRIC ACID 500-334 MG/5ML PO SOLN
30.0000 mL | ORAL | Status: DC | PRN
  Administered 2017-02-21: 30 mL via ORAL
  Filled 2017-02-20: qty 15

## 2017-02-20 MED ORDER — EPHEDRINE 5 MG/ML INJ: 10.0000 mg | INTRAVENOUS | Status: DC | PRN

## 2017-02-20 MED ORDER — PENICILLIN G POTASSIUM 5000000 UNITS IJ SOLR
5.0000 10*6.[IU] | Freq: Once | INTRAMUSCULAR | Status: AC
  Administered 2017-02-20: 5 10*6.[IU] via INTRAVENOUS
  Filled 2017-02-20: qty 5

## 2017-02-20 MED ORDER — LACTATED RINGERS IV SOLN
500.0000 mL | Freq: Once | INTRAVENOUS | Status: AC
  Administered 2017-02-20: 500 mL via INTRAVENOUS

## 2017-02-20 MED ORDER — LIDOCAINE HCL (PF) 1 % IJ SOLN
30.0000 mL | INTRAMUSCULAR | Status: DC | PRN
  Filled 2017-02-20: qty 30

## 2017-02-20 MED ORDER — DIPHENHYDRAMINE HCL 50 MG/ML IJ SOLN: 12.5000 mg | INTRAMUSCULAR | Status: DC | PRN

## 2017-02-20 MED ORDER — LIDOCAINE HCL (PF) 1 % IJ SOLN
INTRAMUSCULAR | Status: DC | PRN
  Administered 2017-02-20: 13 mL via EPIDURAL

## 2017-02-20 MED ORDER — LACTATED RINGERS IV SOLN
INTRAVENOUS | Status: DC
  Administered 2017-02-20 (×3): via INTRAVENOUS

## 2017-02-20 MED ORDER — OXYCODONE-ACETAMINOPHEN 5-325 MG PO TABS: 1.0000 | ORAL_TABLET | ORAL | Status: DC | PRN

## 2017-02-20 MED ORDER — OXYTOCIN 40 UNITS IN LACTATED RINGERS INFUSION - SIMPLE MED
1.0000 m[IU]/min | INTRAVENOUS | Status: DC
  Administered 2017-02-20: 2 m[IU]/min via INTRAVENOUS

## 2017-02-20 MED ORDER — PENICILLIN G POT IN DEXTROSE 60000 UNIT/ML IV SOLN
3.0000 10*6.[IU] | INTRAVENOUS | Status: DC
  Administered 2017-02-20 – 2017-02-21 (×4): 3 10*6.[IU] via INTRAVENOUS
  Filled 2017-02-20 (×7): qty 50

## 2017-02-20 MED ORDER — PHENYLEPHRINE 40 MCG/ML (10ML) SYRINGE FOR IV PUSH (FOR BLOOD PRESSURE SUPPORT)
80.0000 ug | PREFILLED_SYRINGE | INTRAVENOUS | Status: DC | PRN
  Filled 2017-02-20: qty 10

## 2017-02-20 MED ORDER — ONDANSETRON HCL 4 MG/2ML IJ SOLN
4.0000 mg | Freq: Four times a day (QID) | INTRAMUSCULAR | Status: DC | PRN
Start: 2017-02-20 — End: 2017-02-21
  Administered 2017-02-20 – 2017-02-21 (×2): 4 mg via INTRAVENOUS
  Filled 2017-02-20 (×2): qty 2

## 2017-02-20 MED ORDER — OXYTOCIN 40 UNITS IN LACTATED RINGERS INFUSION - SIMPLE MED
2.5000 [IU]/h | INTRAVENOUS | Status: DC
  Filled 2017-02-20: qty 1000

## 2017-02-20 MED ORDER — PHENYLEPHRINE 40 MCG/ML (10ML) SYRINGE FOR IV PUSH (FOR BLOOD PRESSURE SUPPORT): 80.0000 ug | PREFILLED_SYRINGE | INTRAVENOUS | Status: DC | PRN

## 2017-02-20 MED ORDER — ACETAMINOPHEN 325 MG PO TABS: 650.0000 mg | ORAL_TABLET | ORAL | Status: DC | PRN

## 2017-02-20 MED ORDER — OXYTOCIN BOLUS FROM INFUSION: 500.0000 mL | Freq: Once | INTRAVENOUS | Status: DC

## 2017-02-20 MED ORDER — FENTANYL 2.5 MCG/ML BUPIVACAINE 1/10 % EPIDURAL INFUSION (WH - ANES)
14.0000 mL/h | INTRAMUSCULAR | Status: DC | PRN
  Administered 2017-02-20 – 2017-02-21 (×4): 14 mL/h via EPIDURAL
  Filled 2017-02-20 (×4): qty 100

## 2017-02-20 MED ORDER — SODIUM BICARBONATE 8.4 % IV SOLN
INTRAVENOUS | Status: DC | PRN
  Administered 2017-02-20: 5 mL via EPIDURAL
  Administered 2017-02-21: 2 mL via EPIDURAL
  Administered 2017-02-21 (×3): 5 mL via EPIDURAL
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
## 2017-02-20 NOTE — Progress Notes (Signed)
Vitals:   02/20/17 2247 02/20/17 2301  BP: (!) 141/107 (!) 133/109  Pulse: 88   Resp:    Temp:    SpO2:     FHR Cat 1 and 2 (some mild variables at times).  cx 7/90/-2.  IUPC placed and MVU's 160-280, but mostly <200.  Pattern is dysfunctional, some coupling w/4-5 minutes in b/t.  Will try to improve pattern w/pitocin.

## 2017-02-20 NOTE — Anesthesia Pain Management Evaluation Note (Signed)
  CRNA Pain Management Visit Note  Patient: Jenna [name], 21 y.o., female  "Hello I am a member of the anesthesia team at [facility]. We have an anesthesia team available at all times to provide care throughout the hospital, including epidural management and anesthesia for C-section. I don't know your plan for the delivery whether it a natural birth, water birth, IV sedation, nitrous supplementation, doula or epidural, but we want to meet your pain goals."   1.Was your pain managed to your expectations on prior hospitalizations?   Yes   2.What is your expectation for pain management during this hospitalization?     Epidural  3.How can we help you reach that goal? epidural  Record the patient's initial score and the patient's pain goal.   Pain: 2  Pain Goal: 4 [facility] wants you to be able to say your pain was always managed very well.  Madison Hickman 02/20/2017

## 2017-02-20 NOTE — MAU Note (Signed)
Patient started contracting around 0600 and states seeing some bright red blood when wiping after using the bathroom.  Patient walked into MAU and states water broke and running down leg.  Appears thick particulate meconium.

## 2017-02-20 NOTE — Anesthesia Procedure Notes (Signed)
Epidural Patient location during procedure: OB Start time: 02/20/2017 11:54 AM End time: 02/20/2017 12:15 PM  Staffing Anesthesiologist: Anitra Lauth RAY Performed: anesthesiologist   Preanesthetic Checklist Completed: patient identified, site marked, surgical consent, pre-op evaluation, timeout performed, IV checked, risks and benefits discussed and monitors and equipment checked  Epidural Patient position: sitting Prep: DuraPrep Patient monitoring: heart rate, cardiac monitor, continuous pulse ox and blood pressure Approach: midline Location: L2-L3 Injection technique: LOR saline  Needle:  Needle type: Tuohy  Needle gauge: 17 G Needle length: 9 cm Needle insertion depth: 8 cm Catheter type: closed end flexible Catheter size: 20 Guage Catheter at skin depth: 12 cm Test dose: negative  Assessment Events: blood not aspirated, injection not painful, no injection resistance, negative IV test and no paresthesia  Additional Notes Reason for block:procedure for pain

## 2017-02-20 NOTE — Anesthesia Preprocedure Evaluation (Signed)
Anesthesia Evaluation  Patient identified by MRN, date of birth, ID band Patient awake    Reviewed: Allergy & Precautions, NPO status , Patient's Chart, lab work & pertinent test results  Airway Mallampati: II  TM Distance: >3 FB Neck ROM: Full    Dental no notable dental hx.    Pulmonary neg pulmonary ROS, Current Smoker,    Pulmonary exam normal breath sounds clear to auscultation       Cardiovascular negative cardio ROS Normal cardiovascular exam Rhythm:Regular Rate:Normal     Neuro/Psych negative neurological ROS  negative psych ROS   GI/Hepatic negative GI ROS, Neg liver ROS,   Endo/Other  negative endocrine ROS  Renal/GU negative Renal ROS  negative genitourinary   Musculoskeletal negative musculoskeletal ROS (+)   Abdominal   Peds negative pediatric ROS (+)  Hematology negative hematology ROS (+)   Anesthesia Other Findings   Reproductive/Obstetrics negative OB ROS (+) Pregnancy                             Anesthesia Physical Anesthesia Plan  ASA: II  Anesthesia Plan: Epidural   Post-op Pain Management:    Induction:   PONV Risk Score and Plan:   Airway Management Planned:   Additional Equipment:   Intra-op Plan:   Post-operative Plan:   Informed Consent:   Plan Discussed with:   Anesthesia Plan Comments:         Anesthesia Quick Evaluation

## 2017-02-20 NOTE — Progress Notes (Signed)
  LABOR PROGRESS NOTE  Jenna [name] is a 21 y.o. G3P0020 at [redacted]w[redacted]d  admitted for SOL with SROM  Subjective: Having frequent contractions. Feels some pressure currently.   Objective: BP (!) 138/101   Pulse 96   Temp 99 F (37.2 C) (Oral)   Resp 18   Ht 5\' 5"  (1.651 m)   Wt 93 kg (205 lb)   LMP 05/20/2016 (Exact Date)   SpO2 100%   BMI 34.11 kg/m  or  Vitals:   02/20/17 2011 02/20/17 2016 02/20/17 2031 02/20/17 2048  BP: (!) 147/78 (!) 149/83 (!) 159/99 (!) 138/101  Pulse: 69 68 84 96  Resp:      Temp:      TempSrc:      SpO2:      Weight:      Height:         Dilation: 8 (per debbie, RN SVE) Effacement (%): 100 Cervical Position: Anterior Station: -2 Presentation: Vertex Exam by:: Donzetta Sprung, RN FHT: baseline 135 moderate variability accels present, early decels present Uterine activity: q4-5 mins  Labs: Lab Results  Component Value Date   WBC 15.3 (H) 02/20/2017   HGB 11.8 (L) 02/20/2017   HCT 34.3 (L) 02/20/2017   MCV 90.3 02/20/2017   PLT 191 02/20/2017    Patient Active Problem List   Diagnosis Date Noted  . Indication for care in labor or delivery 02/20/2017  . Trichomonal vaginitis during pregnancy in third trimester 01/31/2017  . GBS (group B Streptococcus carrier), +RV culture, currently pregnant 01/31/2017  . History of pelvic fracture 01/28/2017  . Insufficient prenatal care in third trimester 01/02/2017  . Social problem 01/02/2017  . Tobacco smoking affecting pregnancy, antepartum 10/27/2016  . Abnormal maternal serum screening test 10/10/2016  . Supervision of other normal pregnancy, antepartum 03/06/2016    Assessment / Plan: 21 y.o. G3P0020 at [redacted]w[redacted]d here for SOL  Labor: progressing natrually Fetal Wellbeing:  Cat 1 tracing Pain Control:  Has epidural Anticipated MOD:  Vaginal delivery  Tillman Sers, DO PGY-2 8/23/20189:22 PM

## 2017-02-20 NOTE — Progress Notes (Signed)
Labor Progress Note  S: Patient seen & examined for progress of labor. Patient is comfortable with epidural, although she feels some pain in her lower back.  No nausea currently.  O: BP (!) 143/84   Pulse 68   Temp 99.1 F (37.3 C) (Oral)   Resp 18   Ht 5\' 5"  (1.651 m)   Wt 93 kg (205 lb)   LMP 05/20/2016 (Exact Date)   SpO2 100%   BMI 34.11 kg/m   FHT: 120bpm, mod var, +accels, no decels TOCO: q1-18min, patient looks uncomfortable during contractions  CVE: Dilation: 7 Effacement (%): 100 Cervical Position: Anterior Station: -2 Presentation: Vertex Exam by:: Donzetta Sprung, RN  A&P: 20 y.o. [id number] [redacted]w[redacted]d here for SOL s/p SROM. No pitocin currently Continue expectant management Anticipate SVD.  Lezlie Octave, MD FM Resident PGY-1 02/20/2017 5:19 PM

## 2017-02-20 NOTE — Progress Notes (Signed)
Labor Progress Note  S: Patient seen & examined for progress of labor. Patient says epidural has helped, but she is still very uncomfortable. O: BP (!) 153/96   Pulse 67   Temp 98.2 F (36.8 C) (Oral)   Resp 18   Ht 5\' 5"  (1.651 m)   Wt 93 kg (205 lb)   LMP 05/20/2016 (Exact Date)   SpO2 100%   BMI 34.11 kg/m   FHT: 120bpm, mod var, +accels, no decels TOCO: q3-15min, patient looks uncomfortable during contractions  CVE: Dilation: 4 Effacement (%): 90 Cervical Position: Middle Station: -3 Presentation: Vertex Exam by:: Donzetta Sprung, RN  A&P: 20 y.o. [id number] [redacted]w[redacted]d here for SOL s/p SROM.  Patient advised that she can request additional epidural medication if needed. Currently not on pitocin - may add later today if needed Continue expectant management Anticipate SVD.  Lezlie Octave, MD FM Resident PGY-1 02/20/2017 1:27 PM

## 2017-02-21 ENCOUNTER — Encounter (HOSPITAL_COMMUNITY): Payer: Self-pay

## 2017-02-21 ENCOUNTER — Encounter (HOSPITAL_COMMUNITY)
Admission: AD | Disposition: A | Discharge status: Home / Self Care | Payer: Self-pay | Source: Ambulatory Visit | Attending: Obstetrics & Gynecology

## 2017-02-21 DIAGNOSIS — Z3A39 39 weeks gestation of pregnancy: Secondary | ICD-10-CM

## 2017-02-21 LAB — CBC
HEMATOCRIT: 26.1 % — AB (ref 36.0–46.0)
Hemoglobin: 9.1 g/dL — ABNORMAL LOW (ref 12.0–15.0)
MCH: 31.6 pg (ref 26.0–34.0)
MCHC: 34.9 g/dL (ref 30.0–36.0)
MCV: 90.6 fL (ref 78.0–100.0)
Platelets: 149 10*3/uL — ABNORMAL LOW (ref 150–400)
RBC: 2.88 MIL/uL — ABNORMAL LOW (ref 3.87–5.11)
RDW: 13.7 % (ref 11.5–15.5)
WBC: 24.5 10*3/uL — AB (ref 4.0–10.5)

## 2017-02-21 LAB — RPR: RPR Ser Ql: NONREACTIVE

## 2017-02-21 SURGERY — Surgical Case
Anesthesia: Epidural

## 2017-02-21 MED ORDER — KETOROLAC TROMETHAMINE 30 MG/ML IJ SOLN
30.0000 mg | Freq: Once | INTRAMUSCULAR | Status: AC
  Administered 2017-02-21: 30 mg via INTRAMUSCULAR
  Filled 2017-02-21: qty 1

## 2017-02-21 MED ORDER — DEXTROSE 5 % IV SOLN
500.0000 mg | Freq: Once | INTRAVENOUS | Status: AC
  Administered 2017-02-21: 500 mg via INTRAVENOUS
  Filled 2017-02-21: qty 500

## 2017-02-21 MED ORDER — DIBUCAINE 1 % RE OINT: 1.0000 "application " | TOPICAL_OINTMENT | RECTAL | Status: DC | PRN

## 2017-02-21 MED ORDER — FENTANYL CITRATE (PF) 100 MCG/2ML IJ SOLN
INTRAMUSCULAR | Status: AC
  Filled 2017-02-21: qty 2

## 2017-02-21 MED ORDER — SIMETHICONE 80 MG PO CHEW: 80.0000 mg | CHEWABLE_TABLET | ORAL | Status: DC | PRN

## 2017-02-21 MED ORDER — PRENATAL MULTIVITAMIN CH
1.0000 | ORAL_TABLET | Freq: Every day | ORAL | Status: DC
  Administered 2017-02-22 – 2017-02-24 (×3): 1 via ORAL
  Filled 2017-02-21 (×2): qty 1

## 2017-02-21 MED ORDER — OXYTOCIN 40 UNITS IN LACTATED RINGERS INFUSION - SIMPLE MED: 2.5000 [IU]/h | INTRAVENOUS | Status: DC

## 2017-02-21 MED ORDER — LACTATED RINGERS IV SOLN: INTRAVENOUS | Status: DC

## 2017-02-21 MED ORDER — SCOPOLAMINE 1 MG/3DAYS TD PT72
1.0000 | MEDICATED_PATCH | TRANSDERMAL | Status: DC
  Administered 2017-02-21: 1.5 mg via TRANSDERMAL
  Filled 2017-02-21: qty 1

## 2017-02-21 MED ORDER — ONDANSETRON HCL 4 MG/2ML IJ SOLN
INTRAMUSCULAR | Status: DC | PRN
  Administered 2017-02-21: 4 mg via INTRAVENOUS

## 2017-02-21 MED ORDER — SIMETHICONE 80 MG PO CHEW
80.0000 mg | CHEWABLE_TABLET | Freq: Three times a day (TID) | ORAL | Status: DC
  Administered 2017-02-22 – 2017-02-24 (×7): 80 mg via ORAL
  Filled 2017-02-21 (×6): qty 1

## 2017-02-21 MED ORDER — BUPIVACAINE HCL (PF) 0.5 % IJ SOLN
INTRAMUSCULAR | Status: DC | PRN
  Administered 2017-02-21: 30 mL

## 2017-02-21 MED ORDER — SENNOSIDES-DOCUSATE SODIUM 8.6-50 MG PO TABS
2.0000 | ORAL_TABLET | ORAL | Status: DC
  Administered 2017-02-22: 2 via ORAL
  Filled 2017-02-21: qty 2

## 2017-02-21 MED ORDER — FENTANYL CITRATE (PF) 100 MCG/2ML IJ SOLN
INTRAMUSCULAR | Status: DC | PRN
  Administered 2017-02-21: 93 ug via EPIDURAL

## 2017-02-21 MED ORDER — OXYCODONE-ACETAMINOPHEN 5-325 MG PO TABS: 1.0000 | ORAL_TABLET | ORAL | Status: DC | PRN

## 2017-02-21 MED ORDER — ACETAMINOPHEN 325 MG PO TABS
650.0000 mg | ORAL_TABLET | ORAL | Status: DC | PRN
  Administered 2017-02-22: 650 mg via ORAL
  Administered 2017-02-22: 325 mg via ORAL
  Administered 2017-02-23 (×2): 650 mg via ORAL
  Filled 2017-02-21 (×4): qty 2

## 2017-02-21 MED ORDER — DIPHENHYDRAMINE HCL 25 MG PO CAPS: 25.0000 mg | ORAL_CAPSULE | Freq: Four times a day (QID) | ORAL | Status: DC | PRN

## 2017-02-21 MED ORDER — MEPERIDINE HCL 25 MG/ML IJ SOLN
INTRAMUSCULAR | Status: AC
  Filled 2017-02-21: qty 1

## 2017-02-21 MED ORDER — MORPHINE SULFATE (PF) 0.5 MG/ML IJ SOLN
INTRAMUSCULAR | Status: AC
  Filled 2017-02-21: qty 10

## 2017-02-21 MED ORDER — WITCH HAZEL-GLYCERIN EX PADS: 1.0000 "application " | MEDICATED_PAD | CUTANEOUS | Status: DC | PRN

## 2017-02-21 MED ORDER — LACTATED RINGERS IV SOLN
INTRAVENOUS | Status: DC | PRN
  Administered 2017-02-21: 40 [IU] via INTRAVENOUS

## 2017-02-21 MED ORDER — LACTATED RINGERS IV SOLN
INTRAVENOUS | Status: DC
  Administered 2017-02-21 – 2017-02-22 (×2): via INTRAVENOUS

## 2017-02-21 MED ORDER — SODIUM CHLORIDE 0.9 % IR SOLN
Status: DC | PRN
  Administered 2017-02-21: 1000 mL

## 2017-02-21 MED ORDER — OXYCODONE-ACETAMINOPHEN 5-325 MG PO TABS
1.0000 | ORAL_TABLET | ORAL | Status: DC | PRN
  Administered 2017-02-22 – 2017-02-23 (×6): 1 via ORAL
  Filled 2017-02-21 (×6): qty 1

## 2017-02-21 MED ORDER — FENTANYL CITRATE (PF) 100 MCG/2ML IJ SOLN
25.0000 ug | INTRAMUSCULAR | Status: DC | PRN
  Administered 2017-02-21: 50 ug via INTRAVENOUS
  Administered 2017-02-21: 25 ug via INTRAVENOUS

## 2017-02-21 MED ORDER — OXYTOCIN 10 UNIT/ML IJ SOLN
INTRAMUSCULAR | Status: AC
  Filled 2017-02-21: qty 4

## 2017-02-21 MED ORDER — LACTATED RINGERS IV SOLN
INTRAVENOUS | Status: DC | PRN
  Administered 2017-02-21: 07:00:00 via INTRAVENOUS

## 2017-02-21 MED ORDER — TETANUS-DIPHTH-ACELL PERTUSSIS 5-2.5-18.5 LF-MCG/0.5 IM SUSP: 0.5000 mL | Freq: Once | INTRAMUSCULAR | Status: DC

## 2017-02-21 MED ORDER — CEFAZOLIN SODIUM-DEXTROSE 2-3 GM-% IV SOLR
INTRAVENOUS | Status: DC | PRN
  Administered 2017-02-21: 2 g via INTRAVENOUS

## 2017-02-21 MED ORDER — CEFAZOLIN SODIUM-DEXTROSE 2-4 GM/100ML-% IV SOLN
INTRAVENOUS | Status: AC
  Filled 2017-02-21: qty 100

## 2017-02-21 MED ORDER — IBUPROFEN 600 MG PO TABS
600.0000 mg | ORAL_TABLET | Freq: Four times a day (QID) | ORAL | Status: DC
  Administered 2017-02-22 – 2017-02-24 (×11): 600 mg via ORAL
  Filled 2017-02-21 (×10): qty 1

## 2017-02-21 MED ORDER — ONDANSETRON HCL 4 MG/2ML IJ SOLN
INTRAMUSCULAR | Status: AC
  Filled 2017-02-21: qty 2

## 2017-02-21 MED ORDER — LACTATED RINGERS IV SOLN
INTRAVENOUS | Status: DC | PRN
  Administered 2017-02-21 (×3): via INTRAVENOUS

## 2017-02-21 MED ORDER — SIMETHICONE 80 MG PO CHEW
80.0000 mg | CHEWABLE_TABLET | ORAL | Status: DC
  Administered 2017-02-22: 80 mg via ORAL
  Filled 2017-02-21: qty 1

## 2017-02-21 MED ORDER — MEPERIDINE HCL 25 MG/ML IJ SOLN
INTRAMUSCULAR | Status: DC | PRN
Start: 2017-02-21 — End: 2017-02-21
  Administered 2017-02-21: 12.5 mg via INTRAVENOUS

## 2017-02-21 MED ORDER — ONDANSETRON HCL 4 MG/2ML IJ SOLN: 4.0000 mg | Freq: Once | INTRAMUSCULAR | Status: DC | PRN

## 2017-02-21 MED ORDER — COCONUT OIL OIL: 1.0000 "application " | TOPICAL_OIL | Status: DC | PRN

## 2017-02-21 MED ORDER — MENTHOL 3 MG MT LOZG: 1.0000 | LOZENGE | OROMUCOSAL | Status: DC | PRN

## 2017-02-21 MED ORDER — ZOLPIDEM TARTRATE 5 MG PO TABS: 5.0000 mg | ORAL_TABLET | Freq: Every evening | ORAL | Status: DC | PRN

## 2017-02-21 MED ORDER — ONDANSETRON HCL 4 MG/2ML IJ SOLN
4.0000 mg | Freq: Four times a day (QID) | INTRAMUSCULAR | Status: DC | PRN
  Administered 2017-02-21: 4 mg via INTRAVENOUS
  Filled 2017-02-21: qty 2

## 2017-02-21 MED ORDER — CEFAZOLIN SODIUM-DEXTROSE 2-4 GM/100ML-% IV SOLN
2.0000 g | INTRAVENOUS | Status: AC
  Filled 2017-02-21: qty 100

## 2017-02-21 MED ORDER — BUPIVACAINE HCL (PF) 0.5 % IJ SOLN
INTRAMUSCULAR | Status: AC
  Filled 2017-02-21: qty 30

## 2017-02-21 SURGICAL SUPPLY — 29 items
BARRIER ADHS 3X4 INTERCEED (GAUZE/BANDAGES/DRESSINGS) IMPLANT
CHLORAPREP W/TINT 26ML (MISCELLANEOUS) ×3 IMPLANT
CLAMP CORD UMBIL (MISCELLANEOUS) IMPLANT
CLOTH BEACON ORANGE TIMEOUT ST (SAFETY) ×3 IMPLANT
DRSG OPSITE POSTOP 4X10 (GAUZE/BANDAGES/DRESSINGS) ×3 IMPLANT
ELECT REM PT RETURN 9FT ADLT (ELECTROSURGICAL) ×3
ELECTRODE REM PT RTRN 9FT ADLT (ELECTROSURGICAL) ×1 IMPLANT
EXTRACTOR VACUUM KIWI (MISCELLANEOUS) IMPLANT
GLOVE BIO SURGEON STRL SZ 6.5 (GLOVE) ×2 IMPLANT
GLOVE BIO SURGEONS STRL SZ 6.5 (GLOVE) ×1
GLOVE BIOGEL PI IND STRL 7.0 (GLOVE) ×2 IMPLANT
GLOVE BIOGEL PI INDICATOR 7.0 (GLOVE) ×4
GOWN STRL REUS W/TWL LRG LVL3 (GOWN DISPOSABLE) ×6 IMPLANT
KIT ABG SYR 3ML LUER SLIP (SYRINGE) IMPLANT
NEEDLE HYPO 22GX1.5 SAFETY (NEEDLE) IMPLANT
NEEDLE HYPO 25X5/8 SAFETYGLIDE (NEEDLE) IMPLANT
NS IRRIG 1000ML POUR BTL (IV SOLUTION) ×3 IMPLANT
PACK C SECTION WH (CUSTOM PROCEDURE TRAY) ×3 IMPLANT
PAD OB MATERNITY 4.3X12.25 (PERSONAL CARE ITEMS) ×3 IMPLANT
PENCIL SMOKE EVAC W/HOLSTER (ELECTROSURGICAL) ×3 IMPLANT
RETRACTOR WND ALEXIS 25 LRG (MISCELLANEOUS) IMPLANT
RTRCTR WOUND ALEXIS 25CM LRG (MISCELLANEOUS)
SUT VIC AB 0 CT1 36 (SUTURE) ×18 IMPLANT
SUT VIC AB 2-0 CT1 27 (SUTURE) ×2
SUT VIC AB 2-0 CT1 TAPERPNT 27 (SUTURE) ×1 IMPLANT
SUT VIC AB 4-0 PS2 27 (SUTURE) ×3 IMPLANT
SYR CONTROL 10ML LL (SYRINGE) IMPLANT
TOWEL OR 17X24 6PK STRL BLUE (TOWEL DISPOSABLE) ×3 IMPLANT
TRAY FOLEY BAG SILVER LF 14FR (SET/KITS/TRAYS/PACK) IMPLANT

## 2017-02-21 NOTE — Op Note (Signed)
Jenna [name] PROCEDURE DATE: 02/21/2017  PREOPERATIVE DIAGNOSES: Intrauterine pregnancy at [redacted]w[redacted]d weeks gestation; failure to progress: arrest of dilation  POSTOPERATIVE DIAGNOSES: The same  PROCEDURE: Primary Low Transverse Cesarean Section  SURGEON:  Adam Phenix, MD  ASSISTANT:    ANESTHESIOLOGY TEAM: Anesthesiologist: Marcene Duos, MD; Jairo Ben, MD; Lowella Curb, MD CRNA: Angela Adam, CRNA  INDICATIONS: Jenna [name] is a 21 y.o. O1H0865 at [redacted]w[redacted]d here for cesarean section secondary to the indications listed under preoperative diagnoses; please see preoperative note for further details.  The risks of cesarean section were discussed with the patient including but were not limited to: bleeding which may require transfusion or reoperation; infection which may require antibiotics; injury to bowel, bladder, ureters or other surrounding organs; injury to the fetus; need for additional procedures including hysterectomy in the event of a life-threatening hemorrhage; placental abnormalities wth subsequent pregnancies, incisional problems, thromboembolic phenomenon and other postoperative/anesthesia complications.   The patient concurred with the proposed plan, giving informed written consent for the procedure.    FINDINGS:  Viable female infant in cephalic presentation.  Apgars 8 and 9.  Clear amniotic fluid.  Intact placenta, three vessel cord.  Normal uterus, fallopian tubes and ovaries bilaterally.  ANESTHESIA: Epidural  INTRAVENOUS FLUIDS: 2800 ml   ESTIMATED BLOOD LOSS: 1470 ml URINE OUTPUT:  100 ml SPECIMENS: Placenta sent to L&D COMPLICATIONS: None immediate  PROCEDURE IN DETAIL:  The patient preoperatively received intravenous antibiotics and had sequential compression devices applied to her lower extremities.  She was then taken to the operating room where the epidural anesthesia was dosed up to surgical level and was found to be adequate. She was then  placed in a dorsal supine position with a leftward tilt, and prepped and draped in a sterile manner.  A foley catheter was placed into her bladder and attached to constant gravity.  After an adequate timeout was performed, a Pfannenstiel skin incision was made with scalpel and carried through to the underlying layer of fascia. The fascia was incised in the midline, and this incision was extended bilaterally using the Mayo scissors.  Kocher clamps were applied to the superior aspect of the fascial incision and the underlying rectus muscles were dissected off bluntly.  A similar process was carried out on the inferior aspect of the fascial incision. The rectus muscles were separated in the midline bluntly and the peritoneum was entered bluntly. Attention was turned to the lower uterine segment where a low transverse hysterotomy was made with a scalpel and extended bilaterally bluntly.  The infant was successfully delivered, the cord was clamped and cut after one minute, and the infant was handed over to the awaiting neonatology team. Uterine massage was then administered, and the placenta delivered intact with a three-vessel cord. The uterus was then cleared of clots and debris. Uterine atony was noted and she received IV Pitocin. The hysterotomy was closed with 0 Vicryl in a running locked fashion, and an imbricating layer was also placed with 0 Vicryl.  Figure-of-eight 0 Vicryl serosal stitches were placed to help with hemostasis.  The pelvis was cleared of all clot and debris. Hemostasis was confirmed on all surfaces.  The peritoneum was closed with a 2-0 Vicryl running stitch. The fascia was then closed using 0 Vicryl in a running fashion.  The subcutaneous layer was irrigated,  and 30 ml of 0.5% Marcaine was injected subcutaneously around the incision.  The skin was closed with a 4-0 Vicryl subcuticular stitch. The patient  tolerated the procedure well. Sponge, lap, instrument and needle counts were correct x 3.   She was taken to the recovery room in stable condition.    Adam Phenix, MD  Attending Obstetrician & Gynecologist Faculty Practice, [facility]

## 2017-02-21 NOTE — Consult Note (Signed)
[facility]  Delivery Note:  C-section       02/21/2017  7:22 AM  I was called to the operating room at the request of the patient's obstetrician (Dr. Debroah Loop) for a primary c-section.  PRENATAL HX:  This is a 21 y/o G3P0020 at 29 and 4/[redacted] weeks gestation who was admitted on 8/23 after SROM with moderate meconium.  Her pregnancy has been complicated by poor prenatal care (only 4 total visits), tobacco use, and positive trichomonas.  She is GBS positive but received adequate treatment with Penicillin G.  ROM x23 hours.  C-section for failure to progress.    DELIVERY:  Infant was vigorous at delivery, requiring no resuscitation other than standard warming, drying and stimulation.  APGARs 8 and 9.  Exam notable for molding and caput but was otherwise within normal limits.  After 5 minutes, baby left with nurse to assist parents with skin-to-skin care.   _____________________ Electronically Signed By: Maryan Char, MD Neonatologist

## 2017-02-21 NOTE — Progress Notes (Signed)
Upon assessment, patient in bed laying on her right-side, flat and using her arms to push and sit herself up and down in bed. Complaints of pain with this movement. RN advised that she should adjust bed position to semi/low-fowlers for better positioning after surgery and not strain herself. Patient states understanding but refuses to adjust bed. RN attempts to adjust bed but says "I didn't ask to be sat-up, it's better laying down" and proceeds to lay the bed flat, still using her upperbody to sit herself up and down.This contributed to increased pain and nausea.    Pt informed of taking it slow with her diet and eating, starting with crackers and ice chips, until able to progress. RN notified by family that she had already had ice pops and juice and broth soup on her own and was sick from that.       Pt also made aware of need to soon get out of bed for orthostatic vital signs and for assessment of stability.  Pt states that she "is too dizzy and nauseous" for that at moment.    MD notified @ 1600 of increased pain and nausea with order for Zofran added. Zofran administered and patient resting, still flat in the bed.

## 2017-02-21 NOTE — Progress Notes (Signed)
Vitals:   02/21/17 0038 02/21/17 0040  BP: 140/87 136/80  Pulse: 99 89  Resp:    Temp:    SpO2:     Ctx pattern still dysfunctional. Pitocin at 2 mu/min.  cx 7-8/90/-2. Pitocin increased to 4 mu/min, plan increase q 30 mi nutes until pattern is regular.  FHR Cat 1

## 2017-02-21 NOTE — Addendum Note (Signed)
Addendum  created 02/21/17 1334 by Yolonda Kida, CRNA   Sign clinical note

## 2017-02-21 NOTE — Progress Notes (Signed)
No cervical change since 0100, adequate labor documented  90/8/-2/vtx  I offered cesarean section due to active phase arrest  The risks of cesarean section discussed with the patient included but were not limited to: bleeding which may require transfusion or reoperation; infection which may require antibiotics; injury to bowel, bladder, ureters or other surrounding organs; injury to the fetus; need for additional procedures including hysterectomy in the event of a life-threatening hemorrhage; placental abnormalities wth subsequent pregnancies, incisional problems, thromboembolic phenomenon and other postoperative/anesthesia complications. The patient concurred with the proposed plan, giving informed written consent for the procedure.   Patient has been NPO since yesterday. She will remain NPO for procedure. Anesthesia and OR aware. Preoperative prophylactic antibiotics and SCDs ordered on call to the OR.  To OR when ready.  Adam Phenix, MD 02/21/2017

## 2017-02-21 NOTE — Transfer of Care (Addendum)
Immediate Anesthesia Transfer of Care Note  Patient: Jenna [name]  Procedure(s) Performed: Procedure(s): CESAREAN SECTION (N/A)  Patient Location: PACU  Anesthesia Type:Epidural  Level of Consciousness: awake and oriented  Airway & Oxygen Therapy: Patient Spontanous Breathing  Post-op Assessment: Report given to RN and Post -op Vital signs reviewed and stable  Post vital signs: Reviewed  Last Vitals:  Vitals:   02/21/17 0815 02/21/17 0816  BP: (!) 129/91   Pulse: 93 100  Resp:  14  Temp: 36.9 C   SpO2: 100% 100%    Last Pain:  Vitals:   02/21/17 0815  TempSrc: Oral  PainSc: 0-No pain      Patients Stated Pain Goal: 4 (02/20/17 1838)  Complications: No apparent anesthesia complications.  Verbal report given to PACU RN at 0813 prior to leaving for stat cs.

## 2017-02-21 NOTE — Progress Notes (Signed)
At [phone] patient given (0.22mL) of fentanyl IV. An increased dose of fentanyl 50 mcg (54mL) was given at 0926 per patient request and per MD order. Both doses were scanned into the [facility] and verified. 0.65mL ( ) of the 100 mcg vial of fentanyl was left to waste in Pyxis. Pyxis reading 1.5 mL to waste which is incorrect. Spoke with Sharon Seller, RN (charge RN) and pharmacy ([facility]) about waste and medication given.

## 2017-02-21 NOTE — Anesthesia Postprocedure Evaluation (Signed)
Anesthesia Post Note  Patient: Jenna [name]  Procedure(s) Performed: Procedure(s) (LRB): CESAREAN SECTION (N/A)     Patient location during evaluation: PACU Anesthesia Type: Epidural Level of consciousness: awake and alert Pain management: pain level controlled Vital Signs Assessment: post-procedure vital signs reviewed and stable Respiratory status: spontaneous breathing and respiratory function stable Cardiovascular status: blood pressure returned to baseline and stable Postop Assessment: epidural receding Anesthetic complications: no    Last Vitals:  Vitals:   02/21/17 0900 02/21/17 0915  BP: (!) 143/96 (!) 140/102  Pulse: 77 (!) 105  Resp: (!) 26 17  Temp:    SpO2: 100% 100%    Last Pain:  Vitals:   02/21/17 0915  TempSrc:   PainSc: 5    Pain Goal: Patients Stated Pain Goal: 4 (02/20/17 1838)               Kennieth Rad

## 2017-02-21 NOTE — Anesthesia Postprocedure Evaluation (Signed)
Anesthesia Post Note  Patient: Jenna [name]  Procedure(s) Performed: Procedure(s) (LRB): CESAREAN SECTION (N/A)     Patient location during evaluation: Mother Baby Anesthesia Type: Epidural Level of consciousness: awake, awake and alert, oriented and patient cooperative Pain management: pain level controlled Vital Signs Assessment: post-procedure vital signs reviewed and stable Respiratory status: spontaneous breathing, nonlabored ventilation and respiratory function stable Cardiovascular status: stable Postop Assessment: no headache, no backache, patient able to bend at knees and no signs of nausea or vomiting Anesthetic complications: no    Last Vitals:  Vitals:   02/21/17 0945 02/21/17 1050  BP: 136/77 (!) 149/74  Pulse: 84 82  Resp: 16 20  Temp: 36.7 C 36.9 C  SpO2: 100%     Last Pain:  Vitals:   02/21/17 1311  TempSrc:   PainSc: 0-No pain   Pain Goal: Patients Stated Pain Goal: 4 (02/20/17 1838)               Crista Elliot L

## 2017-02-22 ENCOUNTER — Encounter (HOSPITAL_COMMUNITY): Payer: Self-pay | Admitting: Obstetrics & Gynecology

## 2017-02-22 LAB — CBC
HEMATOCRIT: 22.3 % — AB (ref 36.0–46.0)
HEMOGLOBIN: 7.8 g/dL — AB (ref 12.0–15.0)
MCH: 31.6 pg (ref 26.0–34.0)
MCHC: 35 g/dL (ref 30.0–36.0)
MCV: 90.3 fL (ref 78.0–100.0)
Platelets: 131 10*3/uL — ABNORMAL LOW (ref 150–400)
RBC: 2.47 MIL/uL — ABNORMAL LOW (ref 3.87–5.11)
RDW: 13.6 % (ref 11.5–15.5)
WBC: 21.5 10*3/uL — AB (ref 4.0–10.5)

## 2017-02-22 MED ORDER — SENNOSIDES-DOCUSATE SODIUM 8.6-50 MG PO TABS
2.0000 | ORAL_TABLET | Freq: Every evening | ORAL | Status: DC | PRN
  Administered 2017-02-22: 2 via ORAL
  Filled 2017-02-22: qty 2

## 2017-02-22 MED ORDER — FERROUS GLUCONATE 324 (38 FE) MG PO TABS
324.0000 mg | ORAL_TABLET | Freq: Two times a day (BID) | ORAL | Status: DC
  Administered 2017-02-22 – 2017-02-24 (×3): 324 mg via ORAL
  Filled 2017-02-22 (×5): qty 1

## 2017-02-22 MED ORDER — PNEUMOCOCCAL VAC POLYVALENT 25 MCG/0.5ML IJ INJ
0.5000 mL | INJECTION | INTRAMUSCULAR | Status: AC
  Administered 2017-02-24: 0.5 mL via INTRAMUSCULAR
  Filled 2017-02-22: qty 0.5

## 2017-02-22 MED ORDER — POLYETHYLENE GLYCOL 3350 17 G PO PACK
17.0000 g | PACK | Freq: Every day | ORAL | Status: DC
  Administered 2017-02-23 – 2017-02-24 (×2): 17 g via ORAL
  Filled 2017-02-22 (×3): qty 1

## 2017-02-22 NOTE — Progress Notes (Addendum)
Daily Post Partum Note  Jenna [name] is a 21 y.o. G3P102  @ [redacted]w[redacted]d  POD#1 (EBL#1518mL) s/p pLTCS @ [redacted]w[redacted]d for failure to progress.  Pregnancy c/b GBs pos, trich, new gHTN, insufficient PNC 24hr/overnight events:  none  Subjective:  Voiding, ambulating, no s/s of anemia, taking PO w/o issue, pain controlled, lochia normal and decreasing, no s/s of pre-x  Objective:    Current Vital Signs 24h Vital Sign Ranges  T 98.3 F (36.8 C) Temp  Avg: 98.1 F (36.7 C)  Min: 97.6 F (36.4 C)  Max: 98.5 F (36.9 C)  BP (!) 119/54 BP  Min: 119/54  Max: 149/87  HR 60 Pulse  Avg: 73.6  Min: 57  Max: 102  RR 18 Resp  Avg: 18.6  Min: 18  Max: 20  SaO2 97 % Not Delivered SpO2  Avg: 98.5 %  Min: 97 %  Max: 100 %       24 Hour I/O Current Shift I/O  Time Ins Outs 08/24 0701 - 08/25 0700 In: 3560 [P.O.:960; I.V.:2600] Out: 3220 [Urine:1750] No intake/output data recorded.   Patient Vitals for the past 24 hrs:  BP Temp Temp src Pulse Resp SpO2  02/22/17 0249 (!) 119/54 98.3 F (36.8 C) Oral 60 18 97 %  02/22/17 0001 (!) 120/55 98 F (36.7 C) Oral 60 20 97 %  02/21/17 1949 (!) 139/98 - - 91 18 -  02/21/17 1948 138/87 - - 63 18 -  02/21/17 1947 137/77 97.9 F (36.6 C) - (!) 57 18 100 %  02/21/17 1545 (!) 149/87 97.6 F (36.4 C) Oral (!) 102 18 100 %  02/21/17 1050 (!) 149/74 98.5 F (36.9 C) Oral 82 20 -   General: NAD Abdomen: rare BS, soft, mildly distended, nttp, c/d/i minimal old blood on dressing Perineum: deferred Skin:  Warm and dry.  Cardiovascular: S1, S2 normal, no murmur, rub or gallop, regular rate and rhythm Respiratory:  Clear to auscultation bilateral. Normal respiratory effort Extremities: c/c/e  Medications Current Facility-Administered Medications  Medication Dose Route Frequency Provider Last Rate Last Dose  . acetaminophen (TYLENOL) tablet 650 mg  650 mg Oral Q4H PRN Adam Phenix, MD      . coconut oil  1 application Topical PRN Adam Phenix, MD      . witch  hazel-glycerin (TUCKS) pad 1 application  1 application Topical PRN Adam Phenix, MD       And  . dibucaine (NUPERCAINAL) 1 % rectal ointment 1 application  1 application Rectal PRN Adam Phenix, MD      . diphenhydrAMINE (BENADRYL) capsule 25 mg  25 mg Oral Q6H PRN Adam Phenix, MD      . ibuprofen (ADVIL,MOTRIN) tablet 600 mg  600 mg Oral Q6H Adam Phenix, MD   600 mg at 02/22/17 0546  . lactated ringers infusion   Intravenous Continuous Adam Phenix, MD      . lactated ringers infusion   Intravenous Continuous Adam Phenix, MD 125 mL/hr at 02/22/17 0140    . menthol-cetylpyridinium (CEPACOL) lozenge 3 mg  1 lozenge Oral Q2H PRN Adam Phenix, MD      . ondansetron [facility]) injection 4 mg  4 mg Intravenous Q6H PRN Lennox Solders, MD   4 mg at 02/21/17 1705  . oxyCODONE-acetaminophen (PERCOCET/ROXICET) 5-325 MG per tablet 1 tablet  1 tablet Oral Q4H PRN Lennox Solders, MD   1 tablet at 02/22/17 0546  . [  START ON 02/23/2017] pneumococcal 23 valent vaccine (PNU-IMMUNE) injection 0.5 mL  0.5 mL Intramuscular Tomorrow-1000 Adam Phenix, MD      . polyethylene glycol (MIRALAX / GLYCOLAX) packet 17 g  17 g Oral Daily [place] Bing, MD      . prenatal multivitamin tablet 1 tablet  1 tablet Oral Q1200 Adam Phenix, MD      . scopolamine (TRANSDERM-SCOP) 1 MG/3DAYS 1.5 mg  1 patch Transdermal Q72H Lennox Solders, MD   1.5 mg at 02/21/17 1945  . senna-docusate (Senokot-S) tablet 2 tablet  2 tablet Oral QHS PRN [place] Bing, MD      . simethicone (MYLICON) chewable tablet 80 mg  80 mg Oral TID PC Adam Phenix, MD      . Tdap Leda Min) injection 0.5 mL  0.5 mL Intramuscular Once Adam Phenix, MD        Labs:   Recent Labs Lab 02/20/17 [phone] 02/21/17 0855 02/22/17 0524  WBC 15.3* 24.5* 21.5*  HGB 11.8* 9.1* 7.8*  HCT 34.3* 26.1* 22.3*  PLT 191 149* 131*    Recent Labs Lab 02/20/17 0835  NA 136  K 4.1  CL 105  CO2 21*  BUN 7  CREATININE 0.54   GLUCOSE 82  CALCIUM 9.0    Assessment & Plan:  Pt doing well *Postpartum/postop: routine care *gHTN: BPs normalizing, continue to follow *Anemia: no s/s. Started on bid iron and bm regimen *Trich: TOC at PP visit *Dispo: likely POD#3  O POS / Rubella Immune / Varicella Not immune/  RPR negative / HIV negative / HepBsAg negative / Tdap UTD: ordered/Flu shot: n/a/ pap too young / Bottle  / Contraception: not d/w pt / Circ: no inpatient circ/ Follow up: [facility]  Cornelia Copa MD Attending Center for [facility])

## 2017-02-22 NOTE — Addendum Note (Signed)
Addendum  created 02/22/17 0740 by Angela Adam, CRNA   Anesthesia Event deleted, Anesthesia Event edited, Anesthesia Staff edited, Sign clinical note

## 2017-02-23 LAB — CBC
HCT: 20.1 % — ABNORMAL LOW (ref 36.0–46.0)
HEMOGLOBIN: 7 g/dL — AB (ref 12.0–15.0)
MCH: 31.4 pg (ref 26.0–34.0)
MCHC: 34.8 g/dL (ref 30.0–36.0)
MCV: 90.1 fL (ref 78.0–100.0)
Platelets: 135 10*3/uL — ABNORMAL LOW (ref 150–400)
RBC: 2.23 MIL/uL — AB (ref 3.87–5.11)
RDW: 13.8 % (ref 11.5–15.5)
WBC: 13.4 10*3/uL — ABNORMAL HIGH (ref 4.0–10.5)

## 2017-02-23 MED ORDER — OXYCODONE HCL 5 MG PO TABS
5.0000 mg | ORAL_TABLET | ORAL | Status: DC | PRN
  Administered 2017-02-23 – 2017-02-24 (×5): 5 mg via ORAL
  Filled 2017-02-23 (×6): qty 1

## 2017-02-23 MED ORDER — OXYCODONE-ACETAMINOPHEN 5-325 MG PO TABS: 1.0000 | ORAL_TABLET | ORAL | Status: DC | PRN

## 2017-02-23 MED ORDER — OXYCODONE HCL 5 MG PO TABS
5.0000 mg | ORAL_TABLET | Freq: Once | ORAL | Status: DC
  Filled 2017-02-23: qty 1

## 2017-02-23 MED ORDER — OXYCODONE HCL 5 MG PO TABS
5.0000 mg | ORAL_TABLET | Freq: Once | ORAL | Status: AC
  Administered 2017-02-23: 5 mg via ORAL

## 2017-02-23 NOTE — Progress Notes (Signed)
Rn called Dr. Cherie Dark regarding patient's pain level . Dr Cherie Dark ordered a one time dose of oxycodone.

## 2017-02-23 NOTE — Progress Notes (Signed)
Subjective: Postpartum Day 2: Cesarean Delivery Patient reports incisional pain, tolerating PO and + flatus.    Objective: Vital signs in last 24 hours: Temp:  [98.2 F (36.8 C)-98.3 F (36.8 C)] 98.3 F (36.8 C) (08/26 0504) Pulse Rate:  [60-73] 66 (08/26 0504) Resp:  [16-18] 18 (08/26 0504) BP: (103-135)/(58-74) 135/74 (08/26 0504)  Physical Exam:  General: alert, cooperative and no distress Lochia: appropriate Uterine Fundus: firm Incision: no significant drainage DVT Evaluation: No evidence of DVT seen on physical exam.   Recent Labs  02/22/17 0524 02/23/17 0523  HGB 7.8* 7.0*  HCT 22.3* 20.1*    Assessment/Plan: Status post Cesarean section. Doing well postoperatively.  Continue current care Anticipate Discharge tomorrow.  Wynelle Bourgeois 02/23/2017, 6:24 AM

## 2017-02-24 DIAGNOSIS — Z98891 History of uterine scar from previous surgery: Secondary | ICD-10-CM

## 2017-02-24 MED ORDER — OXYCODONE HCL 5 MG PO TABS: 5.0000 mg | ORAL_TABLET | Freq: Once | ORAL | 0 refills | Status: AC

## 2017-02-24 MED ORDER — IBUPROFEN 600 MG PO TABS: 600.0000 mg | ORAL_TABLET | Freq: Four times a day (QID) | ORAL | 0 refills | Status: DC

## 2017-02-24 MED ORDER — FERROUS GLUCONATE 324 (38 FE) MG PO TABS: 324.0000 mg | ORAL_TABLET | Freq: Two times a day (BID) | ORAL | 1 refills | Status: DC

## 2017-02-24 NOTE — Progress Notes (Signed)
Patient called and requested formula for her baby. Writer took four bottles  of Similac and four nipples. Writer proceeded to update the feeding chart and enquire from patient how much and how often she feeds the baby. Patient got annoyed and lash out at [employer]..."what the fuck you are asking me for. You better give me the fucking free food and leave my room". Writer told pt that we are required to keep a record of the intakes and outputs so that we can assess the baby's progress.

## 2017-02-24 NOTE — Discharge Instructions (Signed)

## 2017-02-24 NOTE — Discharge Summary (Signed)
OB Discharge Summary     Patient Name: Jenna [name] DOB: 03-28-1996 MRN: [id number]  Date of admission: 02/20/2017 Delivering MD: Adam Phenix   Date of discharge: 02/24/2017  Admitting diagnosis: 39WKS, PAIN,HEAVY BLEEDING Intrauterine pregnancy: [redacted]w[redacted]d     Secondary diagnosis:  Active Problems:   Indication for care in labor or delivery  Additional problems:  Patient Active Problem List   Diagnosis Date Noted  . Status post primary low transverse cesarean section 02/24/2017  . Indication for care in labor or delivery 02/20/2017  . Trichomonal vaginitis during pregnancy in third trimester 01/31/2017  . GBS (group B Streptococcus carrier), +RV culture, currently pregnant 01/31/2017  . History of pelvic fracture 01/28/2017  . Insufficient prenatal care in third trimester 01/02/2017  . Social problem 01/02/2017  . Tobacco smoking affecting pregnancy, antepartum 10/27/2016  . Abnormal maternal serum screening test 10/10/2016  . Supervision of other normal pregnancy, antepartum 03/06/2016     Discharge diagnosis: Term Pregnancy Delivered and Gestational Hypertension                                                                                                Post partum procedures:None  Augmentation: None  Complications: None  Hospital course:  Onset of Labor With Unplanned C/S  21 y.o. yo [id number] at [redacted]w[redacted]d was admitted in Latent Labor on 02/20/2017. Patient had a labor course significant for gHTN, poor prenatal care. Membrane Rupture Time/Date: 8:10 AM ,02/20/2017   The patient went for cesarean section due to active phase arrest, and delivered a Viable infant,02/21/2017  Details of operation can be found in separate operative note. Patient had an uncomplicated postpartum course.  She is ambulating,tolerating a regular diet, passing flatus, and urinating well.  Patient is discharged home in stable condition 02/24/17.  Physical exam  Vitals:   02/22/17 0830 02/22/17 1855  02/23/17 0504 02/23/17 1746  BP: (!) 109/58 103/70 135/74 122/70  Pulse: 60 73 66 85  Resp: 16 18 18 20   Temp: 98.3 F (36.8 C) 98.2 F (36.8 C) 98.3 F (36.8 C) 99.7 F (37.6 C)  TempSrc: Oral Oral Oral Oral  SpO2:    100%  Weight:      Height:       General: alert, cooperative and no distress Lochia: appropriate Uterine Fundus: firm Incision: Healing well with no significant drainage, Dressing is clean, dry, and intact DVT Evaluation: No evidence of DVT seen on physical exam. Labs: Lab Results  Component Value Date   WBC 13.4 (H) 02/23/2017   HGB 7.0 (L) 02/23/2017   HCT 20.1 (L) 02/23/2017   MCV 90.1 02/23/2017   PLT 135 (L) 02/23/2017   CMP Latest Ref Rng & Units 02/20/2017  Glucose 65 - 99 mg/dL 82  BUN 6 - 20 mg/dL 7  Creatinine 0.27 - 7.41 mg/dL 2.87  Sodium 867 - 672 mmol/L 136  Potassium 3.5 - 5.1 mmol/L 4.1  Chloride 101 - 111 mmol/L 105  CO2 22 - 32 mmol/L 21(L)  Calcium 8.9 - 10.3 mg/dL 9.0  Total Protein 6.5 - 8.1 g/dL 6.9  Total Bilirubin 0.3 -  1.2 mg/dL 0.6  Alkaline Phos 38 - 126 U/L 101  AST 15 - 41 U/L 19  ALT 14 - 54 U/L 9(L)    Discharge instruction: per After Visit Summary and "Baby and Me Booklet".  After visit meds:  Allergies as of 02/24/2017   No Known Allergies     Medication List    TAKE these medications   ferrous gluconate 324 MG tablet Commonly known as:  FERGON Take 1 tablet (324 mg total) by mouth 2 (two) times daily with a meal.   ibuprofen 600 MG tablet Commonly known as:  ADVIL,MOTRIN Take 1 tablet (600 mg total) by mouth every 6 (six) hours.   oxyCODONE 5 MG immediate release tablet Commonly known as:  Oxy IR/ROXICODONE Take 1 tablet (5 mg total) by mouth once.   polyethylene glycol powder powder Commonly known as:  GLYCOLAX/MIRALAX Take 17 g by mouth 2 (two) times daily as needed for moderate constipation.   PRENATAL COMPLETE 14-0.4 MG Tabs Take 1 tablet by mouth daily.            Discharge Care  Instructions        Start     Ordered   02/24/17 0000  ferrous gluconate (FERGON) 324 MG tablet  2 times daily with meals     02/24/17 0743   02/24/17 0000  ibuprofen (ADVIL,MOTRIN) 600 MG tablet  Every 6 hours     02/24/17 0743   02/24/17 0000  oxyCODONE (OXY IR/ROXICODONE) 5 MG immediate release tablet   Once     02/24/17 0743      Diet: routine diet  Activity: Advance as tolerated. Pelvic rest for 6 weeks.   Outpatient follow up:2 weeks Follow up Appt: Follow-up Information    [facility]. Schedule an appointment as soon as possible for a visit.   Why:  For a 4 week postpartum visit Contact information: [address] [place] [address] [phone]         Postpartum contraception: Nexplanon  Newborn Data: Live born female  Birth Weight: 7 lb 3.4 oz (3272 g) APGAR: 8, 9  Baby Feeding: Breast Disposition:home with mother   02/24/2017 Caryl Ada, DO

## 2017-02-28 ENCOUNTER — Encounter (HOSPITAL_COMMUNITY): Payer: Self-pay | Admitting: *Deleted

## 2017-02-28 ENCOUNTER — Inpatient Hospital Stay (HOSPITAL_COMMUNITY)
Admission: AD | Admit: 2017-02-28 | Discharge: 2017-03-03 | DRG: 776 | Disposition: A | Discharge status: Home / Self Care | Payer: Medicaid Other | Source: Ambulatory Visit | Attending: Obstetrics & Gynecology | Admitting: Obstetrics & Gynecology

## 2017-02-28 DIAGNOSIS — O99335 Smoking (tobacco) complicating the puerperium: Secondary | ICD-10-CM | POA: Diagnosis present

## 2017-02-28 DIAGNOSIS — O139 Gestational [pregnancy-induced] hypertension without significant proteinuria, unspecified trimester: Secondary | ICD-10-CM

## 2017-02-28 DIAGNOSIS — Z5189 Encounter for other specified aftercare: Secondary | ICD-10-CM

## 2017-02-28 DIAGNOSIS — O1415 Severe pre-eclampsia, complicating the puerperium: Secondary | ICD-10-CM | POA: Diagnosis present

## 2017-02-28 DIAGNOSIS — F17210 Nicotine dependence, cigarettes, uncomplicated: Secondary | ICD-10-CM | POA: Diagnosis present

## 2017-02-28 LAB — CBC
HCT: 24.5 % — ABNORMAL LOW (ref 36.0–46.0)
HEMATOCRIT: 29.3 % — AB (ref 36.0–46.0)
HEMOGLOBIN: 9.7 g/dL — AB (ref 12.0–15.0)
Hemoglobin: 8.3 g/dL — ABNORMAL LOW (ref 12.0–15.0)
MCH: 29.8 pg (ref 26.0–34.0)
MCH: 30.7 pg (ref 26.0–34.0)
MCHC: 33.1 g/dL (ref 30.0–36.0)
MCHC: 33.9 g/dL (ref 30.0–36.0)
MCV: 89.9 fL (ref 78.0–100.0)
MCV: 90.7 fL (ref 78.0–100.0)
PLATELETS: 370 10*3/uL (ref 150–400)
Platelets: 304 10*3/uL (ref 150–400)
RBC: 3.26 MIL/uL — AB (ref 3.87–5.11)
RBC: 2.7 MIL/uL — ABNORMAL LOW (ref 3.87–5.11)
RDW: 13.8 % (ref 11.5–15.5)
RDW: 13.9 % (ref 11.5–15.5)
WBC: 9 10*3/uL (ref 4.0–10.5)
WBC: 8.4 10*3/uL (ref 4.0–10.5)

## 2017-02-28 LAB — COMPREHENSIVE METABOLIC PANEL
ALBUMIN: 3.1 g/dL — AB (ref 3.5–5.0)
ALBUMIN: 2.8 g/dL — AB (ref 3.5–5.0)
ALK PHOS: 84 U/L (ref 38–126)
ALK PHOS: 81 U/L (ref 38–126)
ALT: 21 U/L (ref 14–54)
ALT: 19 U/L (ref 14–54)
AST: 23 U/L (ref 15–41)
AST: 21 U/L (ref 15–41)
Anion gap: 8 (ref 5–15)
Anion gap: 10 (ref 5–15)
BILIRUBIN TOTAL: 0.3 mg/dL (ref 0.3–1.2)
BILIRUBIN TOTAL: 0.4 mg/dL (ref 0.3–1.2)
BUN: 5 mg/dL — AB (ref 6–20)
CALCIUM: 8.5 mg/dL — AB (ref 8.9–10.3)
CALCIUM: 8.2 mg/dL — AB (ref 8.9–10.3)
CO2: 24 mmol/L (ref 22–32)
CO2: 24 mmol/L (ref 22–32)
CREATININE: 0.63 mg/dL (ref 0.44–1.00)
CREATININE: 0.64 mg/dL (ref 0.44–1.00)
Chloride: 106 mmol/L (ref 101–111)
Chloride: 106 mmol/L (ref 101–111)
GFR calc Af Amer: >60 mL/min (ref >60)
GFR calc Af Amer: >60 mL/min (ref >60)
GFR calc non Af Amer: >60 mL/min (ref >60)
GFR calc non Af Amer: >60 mL/min (ref >60)
GLUCOSE: 82 mg/dL (ref 65–99)
GLUCOSE: 80 mg/dL (ref 65–99)
Potassium: 3.8 mmol/L (ref 3.5–5.1)
Potassium: 3.4 mmol/L — ABNORMAL LOW (ref 3.5–5.1)
SODIUM: 138 mmol/L (ref 135–145)
SODIUM: 140 mmol/L (ref 135–145)
TOTAL PROTEIN: 6.1 g/dL — AB (ref 6.5–8.1)
Total Protein: 5.9 g/dL — ABNORMAL LOW (ref 6.5–8.1)

## 2017-02-28 LAB — RAPID URINE DRUG SCREEN, HOSP PERFORMED
Amphetamines: NOT DETECTED
BENZODIAZEPINES: NOT DETECTED
Barbiturates: NOT DETECTED
Cocaine: NOT DETECTED
OPIATES: NOT DETECTED
Tetrahydrocannabinol: POSITIVE — AB

## 2017-02-28 LAB — URINALYSIS, ROUTINE W REFLEX MICROSCOPIC
BILIRUBIN URINE: NEGATIVE
Glucose, UA: NEGATIVE mg/dL
Hgb urine dipstick: NEGATIVE
Ketones, ur: NEGATIVE mg/dL
Nitrite: NEGATIVE
PH: 7 (ref 5.0–8.0)
Protein, ur: NEGATIVE mg/dL
SPECIFIC GRAVITY, URINE: 1.011 (ref 1.005–1.030)

## 2017-02-28 LAB — PROTEIN / CREATININE RATIO, URINE
CREATININE, URINE: 86 mg/dL
PROTEIN CREATININE RATIO: 0.07 mg/mg{creat} (ref 0.00–0.15)
TOTAL PROTEIN, URINE: 6 mg/dL

## 2017-02-28 LAB — MAGNESIUM: Magnesium: 3.8 mg/dL — ABNORMAL HIGH (ref 1.7–2.4)

## 2017-02-28 MED ORDER — AMLODIPINE BESYLATE 5 MG PO TABS
5.0000 mg | ORAL_TABLET | Freq: Every day | ORAL | Status: DC
  Administered 2017-02-28 – 2017-03-01 (×2): 5 mg via ORAL
  Filled 2017-02-28 (×4): qty 1

## 2017-02-28 MED ORDER — HYDRALAZINE HCL 20 MG/ML IJ SOLN
10.0000 mg | Freq: Once | INTRAMUSCULAR | Status: AC | PRN
  Administered 2017-02-28: 10 mg via INTRAVENOUS
  Filled 2017-02-28: qty 1

## 2017-02-28 MED ORDER — ACETAMINOPHEN 500 MG PO TABS
1000.0000 mg | ORAL_TABLET | Freq: Once | ORAL | Status: AC
  Administered 2017-02-28: 1000 mg via ORAL
  Filled 2017-02-28: qty 2

## 2017-02-28 MED ORDER — MAGNESIUM SULFATE 40 G IN LACTATED RINGERS - SIMPLE
2.0000 g/h | INTRAVENOUS | Status: AC
Start: 2017-02-28 — End: 2017-03-01
  Administered 2017-03-01: 2 g/h via INTRAVENOUS
  Filled 2017-02-28: qty 500
  Filled 2017-02-28: qty 40

## 2017-02-28 MED ORDER — LACTATED RINGERS IV SOLN
INTRAVENOUS | Status: DC
  Administered 2017-02-28 (×2): via INTRAVENOUS
  Administered 2017-03-01: 100 mL/h via INTRAVENOUS

## 2017-02-28 MED ORDER — LABETALOL HCL 5 MG/ML IV SOLN
20.0000 mg | INTRAVENOUS | Status: AC | PRN
  Administered 2017-02-28: 20 mg via INTRAVENOUS
  Administered 2017-02-28: 40 mg via INTRAVENOUS
  Administered 2017-02-28: 80 mg via INTRAVENOUS
  Filled 2017-02-28: qty 8
  Filled 2017-02-28: qty 16
  Filled 2017-02-28: qty 4

## 2017-02-28 MED ORDER — METRONIDAZOLE 500 MG PO TABS
2000.0000 mg | ORAL_TABLET | Freq: Once | ORAL | Status: AC
  Administered 2017-02-28: 2000 mg via ORAL
  Filled 2017-02-28: qty 4

## 2017-02-28 MED ORDER — ZOLPIDEM TARTRATE 5 MG PO TABS
5.0000 mg | ORAL_TABLET | Freq: Every evening | ORAL | Status: DC | PRN
Start: 2017-02-28 — End: 2017-03-03
  Administered 2017-02-28 – 2017-03-01 (×2): 5 mg via ORAL
  Filled 2017-02-28 (×2): qty 1

## 2017-02-28 MED ORDER — AMLODIPINE BESYLATE 5 MG PO TABS: 5.0000 mg | ORAL_TABLET | Freq: Every day | ORAL | 0 refills | Status: DC

## 2017-02-28 MED ORDER — ALPRAZOLAM 0.5 MG PO TABS
0.5000 mg | ORAL_TABLET | Freq: Once | ORAL | Status: AC
  Administered 2017-02-28: 0.5 mg via ORAL
  Filled 2017-02-28: qty 1

## 2017-02-28 MED ORDER — MAGNESIUM SULFATE BOLUS VIA INFUSION
4.0000 g | Freq: Once | INTRAVENOUS | Status: AC
  Administered 2017-02-28: 4 g via INTRAVENOUS
  Filled 2017-02-28: qty 500
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
## 2017-02-28 NOTE — Discharge Instructions (Signed)
Preeclampsia and Eclampsia °Preeclampsia is a serious condition that develops only during pregnancy. It is also called toxemia of pregnancy. This condition causes high blood pressure along with other symptoms, such as swelling and headaches. These symptoms may develop as the condition gets worse. Preeclampsia may occur at 20 weeks of pregnancy or later. °Diagnosing and treating preeclampsia early is very important. If not treated early, it can cause serious problems for you and your baby. One problem it can lead to is eclampsia, which is a condition that causes muscle jerking or shaking (convulsions or seizures) in the mother. Delivering your baby is the best treatment for preeclampsia or eclampsia. Preeclampsia and eclampsia symptoms usually go away after your baby is born. °What are the causes? °The cause of preeclampsia is not known. °What increases the risk? °The following risk factors make you more likely to develop preeclampsia: °· Being pregnant for the first time. °· Having had preeclampsia during a past pregnancy. °· Having a family history of preeclampsia. °· Having high blood pressure. °· Being pregnant with twins or triplets. °· Being 35 or older. °· Being African-American. °· Having kidney disease or diabetes. °· Having medical conditions such as lupus or blood diseases. °· Being very overweight (obese). ° °What are the signs or symptoms? °The earliest signs of preeclampsia are: °· High blood pressure. °· Increased protein in your urine. Your health care provider will check for this at every visit before you give birth (prenatal visit). ° °Other symptoms that may develop as the condition gets worse include: °· Severe headaches. °· Sudden weight gain. °· Swelling of the hands, face, legs, and feet. °· Nausea and vomiting. °· Vision problems, such as blurred or double vision. °· Numbness in the face, arms, legs, and feet. °· Urinating less than usual. °· Dizziness. °· Slurred speech. °· Abdominal pain,  especially upper abdominal pain. °· Convulsions or seizures. ° °Symptoms generally go away after giving birth. °How is this diagnosed? °There are no screening tests for preeclampsia. Your health care provider will ask you about symptoms and check for signs of preeclampsia during your prenatal visits. You may also have tests that include: °· Urine tests. °· Blood tests. °· Checking your blood pressure. °· Monitoring your baby’s heart rate. °· Ultrasound. ° °How is this treated? °You and your health care provider will determine the treatment approach that is best for you. Treatment may include: °· Having more frequent prenatal exams to check for signs of preeclampsia, if you have an increased risk for preeclampsia. °· Bed rest. °· Reducing how much salt (sodium) you eat. °· Medicine to lower your blood pressure. °· Staying in the hospital, if your condition is severe. There, treatment will focus on controlling your blood pressure and the amount of fluids in your body (fluid retention). °· You may need to take medicine (magnesium sulfate) to prevent seizures. This medicine may be given as an injection or through an IV tube. °· Delivering your baby early, if your condition gets worse. You may have your labor started with medicine (induced), or you may have a cesarean delivery. ° °Follow these instructions at home: °Eating and drinking ° °· Drink enough fluid to keep your urine clear or pale yellow. °· Eat a healthy diet that is low in sodium. Do not add salt to your food. Check nutrition labels to see how much sodium a food or beverage contains. °· Avoid caffeine. °Lifestyle °· Do not use any products that contain nicotine or tobacco, such as cigarettes   and e-cigarettes. If you need help quitting, ask your health care provider. °· Do not use alcohol or drugs. °· Avoid stress as much as possible. Rest and get plenty of sleep. °General instructions °· Take over-the-counter and prescription medicines only as told by your  health care provider. °· When lying down, lie on your side. This keeps pressure off of your baby. °· When sitting or lying down, raise (elevate) your feet. Try putting some pillows underneath your lower legs. °· Exercise regularly. Ask your health care provider what kinds of exercise are best for you. °· Keep all follow-up and prenatal visits as told by your health care provider. This is important. °How is this prevented? °To prevent preeclampsia or eclampsia from developing during another pregnancy: °· Get proper medical care during pregnancy. Your health care provider may be able to prevent preeclampsia or diagnose and treat it early. °· Your health care provider may have you take a low-dose aspirin or a calcium supplement during your next pregnancy. °· You may have tests of your blood pressure and kidney function after giving birth. °· Maintain a healthy weight. Ask your health care provider for help managing weight gain during pregnancy. °· Work with your health care provider to manage any long-term (chronic) health conditions you have, such as diabetes or kidney problems. ° °Contact a health care provider if: °· You gain more weight than expected. °· You have headaches. °· You have nausea or vomiting. °· You have abdominal pain. °· You feel dizzy or light-headed. °Get help right away if: °· You develop sudden or severe swelling anywhere in your body. This usually happens in the legs. °· You gain 5 lbs (2.3 kg) or more during one week. °· You have severe: °? Abdominal pain. °? Headaches. °? Dizziness. °? Vision problems. °? Confusion. °? Nausea or vomiting. °· You have a seizure. °· You have trouble moving any part of your body. °· You develop numbness in any part of your body. °· You have trouble speaking. °· You have any abnormal bleeding. °· You pass out. °This information is not intended to replace advice given to you by your health care provider. Make sure you discuss any questions you have with your health  care provider. °Document Released: 06/14/2000 Document Revised: 02/13/2016 Document Reviewed: 01/22/2016 °Elsevier Interactive Patient Education © 2018 [employer] ° °

## 2017-02-28 NOTE — Progress Notes (Signed)
Notified Thressa ShellerHeather Hogan, CNM of second BP in severe range since arrival to MAU.  Sent message to pharmacy requesting Norvasc now.  Per CNM, give Norvasc and continue to watch, do not start IV therapy yet.  Will continue to monitor.
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
## 2017-03-01 MED ORDER — NIFEDIPINE 10 MG PO CAPS
10.0000 mg | ORAL_CAPSULE | Freq: Once | ORAL | Status: AC
  Administered 2017-03-01: 10 mg via SUBLINGUAL
  Filled 2017-03-01: qty 1

## 2017-03-01 MED ORDER — BUTALBITAL-APAP-CAFFEINE 50-325-40 MG PO TABS
2.0000 | ORAL_TABLET | Freq: Three times a day (TID) | ORAL | Status: DC | PRN
  Administered 2017-03-01: 2 via ORAL
  Filled 2017-03-01: qty 2

## 2017-03-01 MED ORDER — HYDRALAZINE HCL 20 MG/ML IJ SOLN
INTRAMUSCULAR | Status: AC
  Administered 2017-03-01: 10 mg via INTRAVENOUS
  Filled 2017-03-01: qty 1

## 2017-03-01 MED ORDER — HYDRALAZINE HCL 20 MG/ML IJ SOLN
INTRAMUSCULAR | Status: AC
  Filled 2017-03-01: qty 1

## 2017-03-01 MED ORDER — HYDRALAZINE HCL 20 MG/ML IJ SOLN: 10.0000 mg | INTRAMUSCULAR | Status: AC

## 2017-03-01 MED ORDER — HYDRALAZINE HCL 20 MG/ML IJ SOLN
10.0000 mg | INTRAMUSCULAR | Status: AC
  Administered 2017-03-01: 10 mg via INTRAVENOUS

## 2017-03-01 MED ORDER — DOCUSATE SODIUM 100 MG PO CAPS
100.0000 mg | ORAL_CAPSULE | Freq: Two times a day (BID) | ORAL | Status: DC
  Administered 2017-03-01 – 2017-03-03 (×5): 100 mg via ORAL
  Filled 2017-03-01 (×5): qty 1

## 2017-03-01 NOTE — Progress Notes (Signed)
CSW attempted to meet with patient, however, patient was in the bathroom.  CSW will return at a later time.  Blaine HamperAngel Boyd-Gilyard, MSW, LCSW Clinical Social Work [phone]

## 2017-03-01 NOTE — Progress Notes (Signed)
Faculty Practice OB/GYN Attending Note   Subjective:  Patient is sleepy; denies headaches, visual symptoms, RUQ/epigastric pain.    Admitted on 02/28/2017 for Hypertension in pregnancy, preeclampsia, severe, postpartum condition.    Objective:  Blood pressure 122/63, pulse 82, temperature 98.6 F (37 C), temperature source Oral, resp. rate 18, height 5\' 5"  (1.651 m), weight 180 lb 12 oz (82 kg), SpO2 100 %, unknown if currently breastfeeding. Vitals:   03/01/17 0330 03/01/17 0400 03/01/17 0500 03/01/17 0629  BP: (!) 138/92   122/63  Pulse: 81   82  Resp: 18 16 16 18   Temp: 98.2 F (36.8 C)   98.6 F (37 C)  TempSrc: Oral   Oral  SpO2: 100%   100%  Weight:      Height:      Gen: NAD HENT: Normocephalic, atraumatic Lungs: Normal respiratory effort, CTAB Heart: Regular rate noted Abdomen: NT,soft Cervix: Deferred Ext: 2+ DTRs, no edema, no cyanosis, negative Homan's sign  Assessment & Plan:  21 y.o. [id number] admitted for postpartum preeclampsia - Magnesium sulfate will be stopped at 1000, will monitor BP afterwards - Continue Norvasc 5 mg daily - Possible discharge later today.  Jaynie CollinsUGONNA  Donovan Gatchel, MD, FACOG Attending Obstetrician & Gynecologist Faculty Practice, [facility]

## 2017-03-02 MED ORDER — IBUPROFEN 600 MG PO TABS
600.0000 mg | ORAL_TABLET | Freq: Four times a day (QID) | ORAL | Status: DC | PRN
  Administered 2017-03-02 – 2017-03-03 (×4): 600 mg via ORAL
  Filled 2017-03-02 (×4): qty 1

## 2017-03-02 MED ORDER — TRIAMTERENE-HCTZ 37.5-25 MG PO TABS
1.0000 | ORAL_TABLET | Freq: Every day | ORAL | Status: DC
  Administered 2017-03-02 – 2017-03-03 (×2): 1 via ORAL
  Filled 2017-03-02 (×3): qty 1

## 2017-03-02 MED ORDER — AMLODIPINE BESYLATE 10 MG PO TABS
10.0000 mg | ORAL_TABLET | Freq: Every day | ORAL | Status: DC
  Administered 2017-03-02 – 2017-03-03 (×2): 10 mg via ORAL
  Filled 2017-03-02 (×2): qty 1

## 2017-03-02 MED ORDER — OXYCODONE-ACETAMINOPHEN 5-325 MG PO TABS
1.0000 | ORAL_TABLET | Freq: Four times a day (QID) | ORAL | Status: DC | PRN
  Administered 2017-03-02 – 2017-03-03 (×4): 1 via ORAL
  Filled 2017-03-02 (×4): qty 1

## 2017-03-02 NOTE — Progress Notes (Signed)
Subjective: Postpartum Day 8 : Cesarean Delivery/postpartum pre eclampsia Patient reports no headache or blurry vision, minimal pain.    Objective: Vital signs in last 24 hours: Temp:  [98.3 F (36.8 C)-99.4 F (37.4 C)] 98.4 F (36.9 C) (09/02 0400) Pulse Rate:  [83-108] 96 (09/02 0400) Resp:  [16-20] 16 (09/02 0400) BP: (113-170)/(64-112) 121/72 (09/02 0400) SpO2:  [100 %] 100 % (09/02 0400)  Physical Exam:  General: alert, cooperative and no distress Lochia: appropriate Uterine Fundus: firm Incision: healing well DVT Evaluation: No evidence of DVT seen on physical exam.   Recent Labs  02/28/17 0729 02/28/17 1329  HGB 9.7* 8.3*  HCT 29.3* 24.5*    Assessment/Plan: Status post Cesarean section. Doing well postoperatively.  Required several doses of prn BP meds yesterday, BP meds adjusted: increased norvasc to 10 and add dyazide  Will ambulate today and if BP remains stable assess for discharge later today.  EURE,LUTHER H 03/02/2017, 6:56 AM
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
## 2017-03-03 MED ORDER — TRIAMTERENE-HCTZ 37.5-25 MG PO TABS: 1.0000 | ORAL_TABLET | Freq: Every day | ORAL | 1 refills | Status: DC

## 2017-03-03 MED ORDER — IBUPROFEN 600 MG PO TABS: 600.0000 mg | ORAL_TABLET | Freq: Four times a day (QID) | ORAL | 0 refills | Status: DC | PRN

## 2017-03-03 MED ORDER — AMLODIPINE BESYLATE 10 MG PO TABS: 10.0000 mg | ORAL_TABLET | Freq: Every day | ORAL | 1 refills | Status: DC

## 2017-03-03 NOTE — Discharge Summary (Signed)
Physician Discharge Summary  Patient ID: Jenna [name] MRN: [id number] DOB/AGE: [birth year] 21 y.o.  Admit date: 02/28/2017 Discharge date: 03/03/2017  Admission Diagnoses: Postpartum pre eclampsia  Discharge Diagnoses:  Principal Problem:   Severe preeclampsia, postpartum condition   Discharged Condition: stable  Hospital Course: unremarkable, responded to magnesium and oral anti hypertensives  Consults: None  Significant Diagnostic Studies: labs   Treatments:   Discharge Exam: Blood pressure (!) 125/93, pulse 89, temperature 99.1 F (37.3 C), temperature source Oral, resp. rate 18, height 5\' 5"  (1.651 m), weight 180 lb 12 oz (82 kg), SpO2 100 %, unknown if currently breastfeeding. General appearance: alert, cooperative and no distress GI: soft, non-tender; bowel sounds normal; no masses,  no organomegaly Incision/Wound:clean dry intact  Disposition: 01-Home or Self Care  Discharge Instructions    Activity as tolerated    Complete by:  As directed    Call MD for:  persistant nausea and vomiting    Complete by:  As directed    Call MD for:  severe uncontrolled pain    Complete by:  As directed    Call MD for:  temperature >100.4    Complete by:  As directed    Diet - low sodium heart healthy    Complete by:  As directed    Discharge wound care:    Complete by:  As directed    Keep clean dry     Allergies as of 03/03/2017   No Known Allergies     Medication List    TAKE these medications   amLODipine 5 MG tablet Commonly known as:  NORVASC Take 1 tablet (5 mg total) by mouth daily.   amLODipine 10 MG tablet Commonly known as:  NORVASC Take 1 tablet (10 mg total) by mouth daily.   ferrous gluconate 324 MG tablet Commonly known as:  FERGON Take 1 tablet (324 mg total) by mouth 2 (two) times daily with a meal.   ibuprofen 600 MG tablet Commonly known as:  ADVIL,MOTRIN Take 1 tablet (600 mg total) by mouth every 6 (six) hours. What changed:  Another  medication with the same name was added. Make sure you understand how and when to take each.   ibuprofen 600 MG tablet Commonly known as:  ADVIL,MOTRIN Take 1 tablet (600 mg total) by mouth every 6 (six) hours as needed for fever, headache, mild pain or moderate pain. What changed:  You were already taking a medication with the same name, and this prescription was added. Make sure you understand how and when to take each.   oxycodone 5 MG capsule Commonly known as:  OXY-IR Take 5 mg by mouth every 6 (six) hours as needed for pain.   polyethylene glycol powder powder Commonly known as:  GLYCOLAX/MIRALAX Take 17 g by mouth 2 (two) times daily as needed for moderate constipation.   PRENATAL COMPLETE 14-0.4 MG Tabs Take 1 tablet by mouth daily.   triamterene-hydrochlorothiazide 37.5-25 MG tablet Commonly known as:  MAXZIDE-25 Take 1 tablet by mouth daily.            Discharge Care Instructions        Start     Ordered   03/04/17 0000  amLODipine (NORVASC) 10 MG tablet  Daily     03/03/17 1246   03/04/17 0000  triamterene-hydrochlorothiazide (MAXZIDE-25) 37.5-25 MG tablet  Daily     03/03/17 1246   03/03/17 0000  ibuprofen (ADVIL,MOTRIN) 600 MG tablet  Every 6 hours PRN  03/03/17 1246   03/03/17 0000  Diet - low sodium heart healthy     03/03/17 1246   03/03/17 0000  Call MD for:  temperature >100.4     03/03/17 1246   03/03/17 0000  Call MD for:  persistant nausea and vomiting     03/03/17 1246   03/03/17 0000  Call MD for:  severe uncontrolled pain     03/03/17 1246   03/03/17 0000  Activity as tolerated     03/03/17 1246   03/03/17 0000  Discharge wound care:    Comments:  Keep clean dry   03/03/17 1246   02/28/17 0000  amLODipine (NORVASC) 5 MG tablet  Daily    Question:  Supervising Provider  Answer:  Willodean RosenthalHARRAWAY-SMITH, CAROLYN   02/28/17 0801     Follow-up Information    [facility] [place] Follow up.   Specialty:  Obstetrics and  Gynecology Why:  One week for a blood pressure check Contact information: [address], [address] [place] [id number] [phone]          Signed: Lazaro ArmsURE,Azriel Dancy H 03/03/2017, 12:47 PM

## 2017-03-03 NOTE — Progress Notes (Signed)
Patient slept throughout the night but reported to writer that she feels very tired and wishes to sleep in this morning. No further c/o pain. We will continue to monitor.

## 2017-03-07 ENCOUNTER — Ambulatory Visit (INDEPENDENT_AMBULATORY_CARE_PROVIDER_SITE_OTHER): Payer: Medicaid Other

## 2017-03-07 DIAGNOSIS — Z1389 Encounter for screening for other disorder: Secondary | ICD-10-CM

## 2017-03-07 NOTE — Progress Notes (Signed)
Subjective:  Jenna [name] is a 21 y.o. female with hypertension. Current Outpatient Prescriptions  Medication Sig Dispense Refill  . amLODipine (NORVASC) 10 MG tablet Take 1 tablet (10 mg total) by mouth daily. 30 tablet 1  . amLODipine (NORVASC) 5 MG tablet Take 1 tablet (5 mg total) by mouth daily. 30 tablet 0  . ferrous gluconate (FERGON) 324 MG tablet Take 1 tablet (324 mg total) by mouth 2 (two) times daily with a meal. (Patient not taking: Reported on 02/28/2017) 60 tablet 1  . ibuprofen (ADVIL,MOTRIN) 600 MG tablet Take 1 tablet (600 mg total) by mouth every 6 (six) hours. 30 tablet 0  . ibuprofen (ADVIL,MOTRIN) 600 MG tablet Take 1 tablet (600 mg total) by mouth every 6 (six) hours as needed for fever, headache, mild pain or moderate pain. 30 tablet 0  . oxycodone (OXY-IR) 5 MG capsule Take 5 mg by mouth every 6 (six) hours as needed for pain.    . polyethylene glycol powder (GLYCOLAX/MIRALAX) powder Take 17 g by mouth 2 (two) times daily as needed for moderate constipation. (Patient not taking: Reported on 02/28/2017) 500 g 2  . Prenatal Vit-Fe Fumarate-FA (PRENATAL COMPLETE) 14-0.4 MG TABS Take 1 tablet by mouth daily. (Patient not taking: Reported on 02/28/2017) 60 each 0  . triamterene-hydrochlorothiazide (MAXZIDE-25) 37.5-25 MG tablet Take 1 tablet by mouth daily. 30 tablet 1   No current facility-administered medications for this visit.     Hypertension ROS: taking medications as instructed, no medication side effects noted, no TIA's, no chest pain on exertion, no dyspnea on exertion and no swelling of ankles.  New concerns: none.   Objective:  BP 118/80   Pulse 87   Wt 160 lb 8 oz (72.8 kg)   Breastfeeding? No   BMI 26.71 kg/m   Appearance alert, well appearing, and in no distress. General exam BP noted to be well controlled today in office.    Assessment:   Hypertension well controlled.   Plan:  Current treatment plan is effective, no change in therapy. Return in 2  weeks for PP visit and BC.  Agree with nursing staff's documentation of this patient's clinic encounter.  HARPER,CHARLES A, MD

## 2017-03-20 ENCOUNTER — Ambulatory Visit: Payer: Medicaid Other | Admitting: Certified Nurse Midwife

## 2017-04-01 ENCOUNTER — Encounter (HOSPITAL_COMMUNITY): Payer: Self-pay | Admitting: Emergency Medicine

## 2017-04-01 DIAGNOSIS — T50902A Poisoning by unspecified drugs, medicaments and biological substances, intentional self-harm, initial encounter: Secondary | ICD-10-CM | POA: Insufficient documentation

## 2017-04-01 DIAGNOSIS — X838XXA Intentional self-harm by other specified means, initial encounter: Secondary | ICD-10-CM | POA: Insufficient documentation

## 2017-04-01 DIAGNOSIS — Z79899 Other long term (current) drug therapy: Secondary | ICD-10-CM | POA: Diagnosis not present

## 2017-04-01 DIAGNOSIS — Z046 Encounter for general psychiatric examination, requested by authority: Secondary | ICD-10-CM | POA: Diagnosis not present

## 2017-04-01 DIAGNOSIS — F17210 Nicotine dependence, cigarettes, uncomplicated: Secondary | ICD-10-CM | POA: Diagnosis not present

## 2017-04-01 DIAGNOSIS — Y929 Unspecified place or not applicable: Secondary | ICD-10-CM | POA: Insufficient documentation

## 2017-04-01 DIAGNOSIS — R45851 Suicidal ideations: Secondary | ICD-10-CM | POA: Insufficient documentation

## 2017-04-01 DIAGNOSIS — Y999 Unspecified external cause status: Secondary | ICD-10-CM | POA: Diagnosis not present

## 2017-04-01 DIAGNOSIS — Y9389 Activity, other specified: Secondary | ICD-10-CM | POA: Insufficient documentation

## 2017-04-01 DIAGNOSIS — F329 Major depressive disorder, single episode, unspecified: Secondary | ICD-10-CM | POA: Diagnosis present

## 2017-04-01 DIAGNOSIS — T1491XA Suicide attempt, initial encounter: Secondary | ICD-10-CM | POA: Insufficient documentation

## 2017-04-01 NOTE — ED Notes (Signed)
Spoke with Alona Bene at [employer] in regards to pt snorting 10 hydrocodone-acetaminophen, observe pt for 6 hours, watch for CNS depression, respiratory depression, hepatotoxicity, and injury to lungs from snorting.

## 2017-04-02 ENCOUNTER — Encounter (HOSPITAL_COMMUNITY): Payer: Self-pay | Admitting: *Deleted

## 2017-04-02 ENCOUNTER — Emergency Department (HOSPITAL_COMMUNITY)
Admission: EM | Admit: 2017-04-02 | Discharge: 2017-04-02 | Disposition: A | Discharge status: Other Acute Inpatient Hospital | Payer: Medicaid Other | Attending: Emergency Medicine | Admitting: Emergency Medicine

## 2017-04-02 ENCOUNTER — Inpatient Hospital Stay (HOSPITAL_COMMUNITY)
Admission: AD | Admit: 2017-04-02 | Discharge: 2017-04-07 | DRG: 881 | Disposition: A | Discharge status: Home / Self Care | Payer: Medicaid Other | Source: Intra-hospital | Attending: Psychiatry | Admitting: Psychiatry

## 2017-04-02 DIAGNOSIS — Z915 Personal history of self-harm: Secondary | ICD-10-CM | POA: Diagnosis not present

## 2017-04-02 DIAGNOSIS — F53 Postpartum depression: Secondary | ICD-10-CM | POA: Diagnosis not present

## 2017-04-02 DIAGNOSIS — F1110 Opioid abuse, uncomplicated: Secondary | ICD-10-CM | POA: Diagnosis present

## 2017-04-02 DIAGNOSIS — G4700 Insomnia, unspecified: Secondary | ICD-10-CM | POA: Diagnosis present

## 2017-04-02 DIAGNOSIS — O99335 Smoking (tobacco) complicating the puerperium: Secondary | ICD-10-CM | POA: Diagnosis present

## 2017-04-02 DIAGNOSIS — T1491XA Suicide attempt, initial encounter: Secondary | ICD-10-CM

## 2017-04-02 DIAGNOSIS — O99345 Other mental disorders complicating the puerperium: Secondary | ICD-10-CM | POA: Diagnosis present

## 2017-04-02 DIAGNOSIS — T50902A Poisoning by unspecified drugs, medicaments and biological substances, intentional self-harm, initial encounter: Secondary | ICD-10-CM

## 2017-04-02 DIAGNOSIS — O165 Unspecified maternal hypertension, complicating the puerperium: Secondary | ICD-10-CM | POA: Diagnosis present

## 2017-04-02 DIAGNOSIS — T391X2A Poisoning by 4-Aminophenol derivatives, intentional self-harm, initial encounter: Secondary | ICD-10-CM | POA: Diagnosis not present

## 2017-04-02 DIAGNOSIS — Z79899 Other long term (current) drug therapy: Secondary | ICD-10-CM

## 2017-04-02 DIAGNOSIS — F1210 Cannabis abuse, uncomplicated: Secondary | ICD-10-CM | POA: Diagnosis not present

## 2017-04-02 DIAGNOSIS — F332 Major depressive disorder, recurrent severe without psychotic features: Secondary | ICD-10-CM | POA: Diagnosis not present

## 2017-04-02 DIAGNOSIS — Z62810 Personal history of physical and sexual abuse in childhood: Secondary | ICD-10-CM | POA: Diagnosis not present

## 2017-04-02 DIAGNOSIS — R5383 Other fatigue: Secondary | ICD-10-CM | POA: Diagnosis not present

## 2017-04-02 DIAGNOSIS — F17210 Nicotine dependence, cigarettes, uncomplicated: Secondary | ICD-10-CM | POA: Diagnosis present

## 2017-04-02 DIAGNOSIS — Z22330 Carrier of Group B streptococcus: Secondary | ICD-10-CM

## 2017-04-02 DIAGNOSIS — F1290 Cannabis use, unspecified, uncomplicated: Secondary | ICD-10-CM | POA: Diagnosis not present

## 2017-04-02 DIAGNOSIS — F1910 Other psychoactive substance abuse, uncomplicated: Secondary | ICD-10-CM | POA: Diagnosis not present

## 2017-04-02 DIAGNOSIS — R4587 Impulsiveness: Secondary | ICD-10-CM | POA: Diagnosis not present

## 2017-04-02 DIAGNOSIS — F419 Anxiety disorder, unspecified: Secondary | ICD-10-CM | POA: Diagnosis present

## 2017-04-02 DIAGNOSIS — R5381 Other malaise: Secondary | ICD-10-CM | POA: Diagnosis not present

## 2017-04-02 DIAGNOSIS — R4586 Emotional lability: Secondary | ICD-10-CM | POA: Diagnosis not present

## 2017-04-02 HISTORY — DX: Depression, unspecified: F32.A

## 2017-04-02 HISTORY — DX: Anxiety disorder, unspecified: F41.9

## 2017-04-02 HISTORY — DX: Major depressive disorder, single episode, unspecified: F32.9

## 2017-04-02 LAB — CBC
HEMATOCRIT: 27.1 % — AB (ref 36.0–46.0)
HEMOGLOBIN: 8.9 g/dL — AB (ref 12.0–15.0)
MCH: 28.6 pg (ref 26.0–34.0)
MCHC: 32.8 g/dL (ref 30.0–36.0)
MCV: 87.1 fL (ref 78.0–100.0)
Platelets: 216 10*3/uL (ref 150–400)
RBC: 3.11 MIL/uL — ABNORMAL LOW (ref 3.87–5.11)
RDW: 14.3 % (ref 11.5–15.5)
WBC: 7.5 10*3/uL (ref 4.0–10.5)

## 2017-04-02 LAB — COMPREHENSIVE METABOLIC PANEL
ALBUMIN: 3.6 g/dL (ref 3.5–5.0)
ALK PHOS: 60 U/L (ref 38–126)
ALT: 10 U/L — AB (ref 14–54)
ANION GAP: 9 (ref 5–15)
AST: 18 U/L (ref 15–41)
BILIRUBIN TOTAL: 0.5 mg/dL (ref 0.3–1.2)
BUN: 8 mg/dL (ref 6–20)
CALCIUM: 8.8 mg/dL — AB (ref 8.9–10.3)
CO2: 23 mmol/L (ref 22–32)
CREATININE: 0.59 mg/dL (ref 0.44–1.00)
Chloride: 105 mmol/L (ref 101–111)
GFR calc Af Amer: >60 mL/min (ref >60)
GFR calc non Af Amer: >60 mL/min (ref >60)
GLUCOSE: 84 mg/dL (ref 65–99)
Potassium: 3.6 mmol/L (ref 3.5–5.1)
Sodium: 137 mmol/L (ref 135–145)
TOTAL PROTEIN: 6 g/dL — AB (ref 6.5–8.1)

## 2017-04-02 LAB — RAPID URINE DRUG SCREEN, HOSP PERFORMED
Amphetamines: NOT DETECTED
Barbiturates: NOT DETECTED
Benzodiazepines: NOT DETECTED
Cocaine: NOT DETECTED
Opiates: POSITIVE — AB
TETRAHYDROCANNABINOL: POSITIVE — AB

## 2017-04-02 LAB — ETHANOL: Alcohol, Ethyl (B): <10 mg/dL (ref <10)

## 2017-04-02 LAB — SALICYLATE LEVEL: Salicylate Lvl: <7 mg/dL (ref 2.8–30.0)

## 2017-04-02 LAB — ACETAMINOPHEN LEVEL
ACETAMINOPHEN (TYLENOL), SERUM: 19 ug/mL (ref 10–30)
Acetaminophen (Tylenol), Serum: 24 ug/mL (ref 10–30)

## 2017-04-02 LAB — I-STAT BETA HCG BLOOD, ED (MC, WL, AP ONLY)

## 2017-04-02 MED ORDER — NICOTINE 21 MG/24HR TD PT24
21.0000 mg | MEDICATED_PATCH | Freq: Every day | TRANSDERMAL | Status: DC
  Administered 2017-04-02 – 2017-04-05 (×4): 21 mg via TRANSDERMAL
  Filled 2017-04-02 (×9): qty 1

## 2017-04-02 MED ORDER — AMLODIPINE BESYLATE 10 MG PO TABS
10.0000 mg | ORAL_TABLET | Freq: Every day | ORAL | Status: DC
  Administered 2017-04-03 – 2017-04-07 (×4): 10 mg via ORAL
  Filled 2017-04-02 (×7): qty 1

## 2017-04-02 MED ORDER — ONDANSETRON HCL 4 MG PO TABS
4.0000 mg | ORAL_TABLET | Freq: Three times a day (TID) | ORAL | Status: DC | PRN
Start: 2017-04-02 — End: 2017-04-02

## 2017-04-02 MED ORDER — AMLODIPINE BESYLATE 5 MG PO TABS: 5.0000 mg | ORAL_TABLET | Freq: Every day | ORAL | Status: DC

## 2017-04-02 MED ORDER — NICOTINE 21 MG/24HR TD PT24
21.0000 mg | MEDICATED_PATCH | Freq: Every day | TRANSDERMAL | Status: DC
  Filled 2017-04-02: qty 1

## 2017-04-02 MED ORDER — ZOLPIDEM TARTRATE 5 MG PO TABS: 5.0000 mg | ORAL_TABLET | Freq: Every evening | ORAL | Status: DC | PRN

## 2017-04-02 MED ORDER — NICOTINE 21 MG/24HR TD PT24: 21.0000 mg | MEDICATED_PATCH | Freq: Every day | TRANSDERMAL | Status: DC

## 2017-04-02 MED ORDER — IBUPROFEN 400 MG PO TABS: 600.0000 mg | ORAL_TABLET | Freq: Three times a day (TID) | ORAL | Status: DC | PRN

## 2017-04-02 MED ORDER — ALUM & MAG HYDROXIDE-SIMETH 200-200-20 MG/5ML PO SUSP: 30.0000 mL | Freq: Four times a day (QID) | ORAL | Status: DC | PRN

## 2017-04-02 MED ORDER — AMLODIPINE BESYLATE 5 MG PO TABS: 10.0000 mg | ORAL_TABLET | Freq: Every day | ORAL | Status: DC

## 2017-04-02 MED ORDER — TRIAMTERENE-HCTZ 37.5-25 MG PO TABS
1.0000 | ORAL_TABLET | Freq: Every day | ORAL | Status: DC
  Administered 2017-04-03 – 2017-04-07 (×4): 1 via ORAL
  Filled 2017-04-02 (×7): qty 1

## 2017-04-02 MED ORDER — MAGNESIUM HYDROXIDE 400 MG/5ML PO SUSP: 30.0000 mL | Freq: Every day | ORAL | Status: DC | PRN

## 2017-04-02 MED ORDER — TRIAMTERENE-HCTZ 37.5-25 MG PO TABS: 1.0000 | ORAL_TABLET | Freq: Every day | ORAL | Status: DC

## 2017-04-02 MED ORDER — IBUPROFEN 600 MG PO TABS
600.0000 mg | ORAL_TABLET | Freq: Three times a day (TID) | ORAL | Status: DC | PRN
  Administered 2017-04-04: 600 mg via ORAL
  Filled 2017-04-02: qty 1

## 2017-04-02 MED ORDER — ACETAMINOPHEN 325 MG PO TABS: 650.0000 mg | ORAL_TABLET | Freq: Four times a day (QID) | ORAL | Status: DC | PRN

## 2017-04-02 MED ORDER — ZOLPIDEM TARTRATE 5 MG PO TABS
5.0000 mg | ORAL_TABLET | Freq: Every evening | ORAL | Status: DC | PRN
  Administered 2017-04-02: 5 mg via ORAL
  Filled 2017-04-02: qty 1

## 2017-04-02 MED ORDER — ONDANSETRON HCL 4 MG PO TABS
4.0000 mg | ORAL_TABLET | Freq: Three times a day (TID) | ORAL | Status: DC | PRN
  Administered 2017-04-03: 4 mg via ORAL
  Filled 2017-04-02: qty 1

## 2017-04-02 NOTE — ED Notes (Signed)
Voluntary Admission and Consent for Treatment signed by patient and faxed to [facility].  Original in envelope for [facility] and copy placed in medical records.  Patient called cousin to let them know she will be going to [facility].  ED transfer consent signed.  [facility] has been contacted and will be picking up patient at approx. 230pm.

## 2017-04-02 NOTE — ED Notes (Signed)
Pt crying while talking on phone with sister.  Patient wants to not go to [facility] now but has been advised that since she has already signed the voluntary consent form that she has to go or she will be IVCd.

## 2017-04-02 NOTE — Social Work (Signed)
Referred to [facility], is [place] resident.  Anne Cunningham, LCSW Lead Clinical Social Worker Phone:  [phone]  

## 2017-04-02 NOTE — Progress Notes (Signed)
Per Malva Limes , [facility], patient has been accepted to [facility], bed 302-1 ; Accepting provider is Dr. Lucianne Muss; Attending provider is Dr. Jama Flavors.  Patient can arrive at 2:30pm. Number for report is [phone].   Patient may be IVC'd, if so please fax paperwork to [facility] at [phone] before transporting patient.   Malva Limes, [facility], notified the patient's nurse, Griffith Citron, RN.   Baldo Daub MSW, LCSWA CSW Disposition [phone]

## 2017-04-02 NOTE — ED Notes (Signed)
Pt given snack. 

## 2017-04-02 NOTE — [facility] Counselor (Signed)
Per Donell Sievert, PA-C: Recommendation of continual observation for further disposition pending AM evaluation by psychiatry.     [facility] attending provider, Sharilyn Sites, PA-C notified at [phone].

## 2017-04-02 NOTE — ED Triage Notes (Signed)
Pt was upstairs on peds unit, pt crushed and snorted 10 Vicodin tablets as an SI attempt. Pt tearful in triage, states that she doesn't want to drag her child through whatever she is going through. Pt states she does have a support system at home but they have their lives together and she doesn't.   Callie at [facility] has already called [employer]. See her note

## 2017-04-02 NOTE — ED Triage Notes (Signed)
Staffing has been called, no sitters at this time  
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
## 2017-04-02 NOTE — Plan of Care (Signed)
Problem: Coping: Goal: Ability to identify and develop effective coping behavior will improve Outcome: Progressing Nurse discussed anxiety/depression/coping skills with patient.    

## 2017-04-02 NOTE — ED Notes (Signed)
Sister want to be call for any update or POC, she is upstairs on peds taking care of pt's child that is admitted. Pt is ok with her to have her information. Sister's name Mya 336- [phone].
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
## 2017-04-02 NOTE — ED Notes (Signed)
Belongings removed, belongings given to pt sister (clothes, phone, pills, necklace) Security wanded pt

## 2017-04-02 NOTE — Plan of Care (Signed)
Problem: Coping: Goal: Ability to identify and develop effective coping behavior will improve Outcome: Progressing Nurse discussed anxiety/coping skills with patient.

## 2017-04-02 NOTE — Progress Notes (Signed)
Patient attended NA group meeting. 

## 2017-04-02 NOTE — Progress Notes (Signed)
Patient ID: Jenna [name], female   DOB: 28-Oct-1995, 21 y.o.   MRN: [id number]  Pt currently presents with a flat affect and nervous, guarded behavior. Pt has concerns and questions surrounding her medications and ADL activities while at [facility]. Pt reports poor sleep PTA.   Pt provided with medications per providers orders. Pt's labs and vitals were monitored throughout the night. Pt given a 1:1 about emotional and mental status. Pt supported and encouraged to express concerns and questions. Pt educated on medications and suicide safety precautions.  Pt's safety ensured with 15 minute and environmental checks. Pt currently denies SI/HI and A/V hallucinations. Pt verbally agrees to seek staff if SI/HI or A/VH occurs and to consult with staff before acting on any harmful thoughts. Pt requests to "speak to someone about the things that have been going on, I need some advice." Will continue POC.

## 2017-04-03 DIAGNOSIS — G4700 Insomnia, unspecified: Secondary | ICD-10-CM

## 2017-04-03 DIAGNOSIS — Z62810 Personal history of physical and sexual abuse in childhood: Secondary | ICD-10-CM

## 2017-04-03 DIAGNOSIS — F17210 Nicotine dependence, cigarettes, uncomplicated: Secondary | ICD-10-CM

## 2017-04-03 DIAGNOSIS — R4586 Emotional lability: Secondary | ICD-10-CM

## 2017-04-03 DIAGNOSIS — O99345 Other mental disorders complicating the puerperium: Secondary | ICD-10-CM

## 2017-04-03 DIAGNOSIS — R5383 Other fatigue: Secondary | ICD-10-CM

## 2017-04-03 DIAGNOSIS — R5381 Other malaise: Secondary | ICD-10-CM

## 2017-04-03 DIAGNOSIS — R4587 Impulsiveness: Secondary | ICD-10-CM

## 2017-04-03 DIAGNOSIS — F53 Postpartum depression: Secondary | ICD-10-CM

## 2017-04-03 DIAGNOSIS — F1910 Other psychoactive substance abuse, uncomplicated: Secondary | ICD-10-CM

## 2017-04-03 MED ORDER — HYDROXYZINE HCL 25 MG PO TABS
25.0000 mg | ORAL_TABLET | ORAL | Status: DC | PRN
  Administered 2017-04-04 – 2017-04-07 (×6): 25 mg via ORAL
  Filled 2017-04-03 (×6): qty 1

## 2017-04-03 MED ORDER — FLUOXETINE HCL 10 MG PO CAPS
10.0000 mg | ORAL_CAPSULE | Freq: Every day | ORAL | Status: DC
  Administered 2017-04-03 – 2017-04-06 (×4): 10 mg via ORAL
  Filled 2017-04-03 (×6): qty 1

## 2017-04-03 MED ORDER — HYDROXYZINE HCL 50 MG PO TABS
50.0000 mg | ORAL_TABLET | Freq: Every day | ORAL | Status: DC
  Administered 2017-04-03 – 2017-04-05 (×3): 50 mg via ORAL
  Filled 2017-04-03 (×6): qty 1

## 2017-04-03 NOTE — [facility] Suicide Risk Assessment (Signed)
[facility] INPATIENT:  Family/Significant Other Suicide Prevention Education  Suicide Prevention Education:  Education Completed;  Olive Bass Dougan (pt's cousin) [phone] has been identified by the patient as the family member/significant other with whom the patient will be residing, and identified as the person(s) who will aid the patient in the event of a mental health crisis (suicidal ideations/suicide attempt).  With written consent from the patient, the family member/significant other has been provided the following suicide prevention education, prior to the and/or following the discharge of the patient.  The suicide prevention education provided includes the following:  Suicide risk factors  Suicide prevention and interventions  National Suicide Hotline telephone number  [facility] assessment telephone number  [facility] Emergency Assistance 911  [facility] and/or Residential Mobile Crisis Unit telephone number  Request made of family/significant other to:  Remove weapons (e.g., guns, rifles, knives), all items previously/currently identified as safety concern.    Remove drugs/medications (over-the-counter, prescriptions, illicit drugs), all items previously/currently identified as a safety concern.  The family member/significant other verbalizes understanding of the suicide prevention education information provided.  The family member/significant other agrees to remove the items of safety concern listed above.  Jeovany Huitron N Smart LCSW 04/03/2017, 11:06 AM
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
## 2017-04-03 NOTE — Plan of Care (Signed)
Problem: Activity: Goal: Interest or engagement in activities will improve Outcome: Progressing Patient observed to be in the day room interacting in activities with her peers.  Problem: Medication: Goal: Compliance with prescribed medication regimen will improve Outcome: Progressing Patient took all meds as scheduled through entire shift.  Comments: Patient appears to have a blunted affect, denies SI/HI/AVH, reported that her sleep quality last night was fair, reports her appetite as being poor, reports her depression as "10" (10 being the worse).  Patient has been started on Prozac, which she took the first dose of today, for depression.  Pt reports her hopelessness level as "7", and reports being motivated to get better so that she can be with her daughter.  Q15 minute safety checks in place, will continue to monitor.

## 2017-04-03 NOTE — Progress Notes (Signed)
Nutrition Brief Note  Patient identified on the Malnutrition Screening Tool (MST) Report  Pt's weight is back to UBW 1 year ago, pre-pregnancy.   Wt Readings from Last 15 Encounters:  04/02/17 174 lb (78.9 kg)  04/01/17 170 lb (77.1 kg)  03/07/17 160 lb 8 oz (72.8 kg)  02/28/17 180 lb 12 oz (82 kg)  02/20/17 205 lb (93 kg)  02/16/17 205 lb (93 kg)  01/28/17 195 lb (88.5 kg)  01/02/17 185 lb 6.4 oz (84.1 kg)  11/07/16 184 lb 3.2 oz (83.6 kg)  10/23/16 187 lb (84.8 kg)  09/25/16 181 lb (82.1 kg)  08/20/16 171 lb 9.6 oz (77.8 kg)  07/20/16 175 lb (79.4 kg)  06/28/16 169 lb (76.7 kg)  04/25/16 170 lb 8 oz (77.3 kg)    Body mass index is 28.08 kg/m. Patient meets criteria for overweight based on current BMI.  Labs and medications reviewed.   No nutrition interventions warranted at this time. If nutrition issues arise, please consult RD.   Tilda Franco, MS, RD, LDN Pager: [phone] After Hours Pager: [phone]
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
## 2017-04-03 NOTE — Progress Notes (Signed)
Patient ID: Jenna [name], female   DOB: [birth year], 21 y.o.   MRN: [id number]  Pt currently presents with a flat affect and depressed behavior. Pt reports that her day was "okay." Forwards little to Clinical research associate. Pt has concerns that she won't sleep because her Ambien was discontinued. Endorses ongoing nausea. Reports that her son is her motivation to "be better."  Pt provided with medications per providers orders. Pt's labs and vitals were monitored throughout the night. Pt given a 1:1 about emotional and mental status. Pt supported and encouraged to express concerns and questions. Pt educated on medications, specifically vistaril and zofran.  Pt's safety ensured with 15 minute and environmental checks. Pt currently denies SI/HI and A/V hallucinations. Pt verbally agrees to seek staff if SI/HI or A/VH occurs and to consult with staff before acting on any harmful thoughts. Will continue POC.
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
## 2017-04-03 NOTE — Progress Notes (Signed)
Patient attended wrap-up group and said that her day was a 10.  Patient said she was happy because her sister and patient's son had stopped by to visit her.

## 2017-04-03 NOTE — [facility] Group Notes (Signed)
LCSW Group Therapy Note  04/03/2017 1:15pm  Type of Therapy and Topic:  Group Therapy: Avoiding Self-Sabotaging and Enabling Behaviors  Participation Level:  Minimal   Description of Group:   In this group, patients will learn how to identify obstacles, self-sabotaging and enabling behaviors, as well as: what are they, why do we do them and what needs these behaviors meet. Discuss unhealthy relationships and how to have positive healthy boundaries with those that sabotage and enable. Explore aspects of self-sabotage and enabling in yourself and how to limit these self-destructive behaviors in everyday life.   Therapeutic Goals: 1. Patient will identify one obstacle that relates to self-sabotage and enabling behaviors 2. Patient will identify one personal self-sabotaging or enabling behavior they did prior to admission 3. Patient will state a plan to change the above identified behavior 4. Patient will demonstrate ability to communicate their needs through discussion and/or role play.   Summary of Patient Progress:  Jenna [name] attended group and asked general questions about the discharge process and her specific aftercare plan. She actively listened as others shared in group processing. At this time, Ferris continues to make limited progress in the group setting with moderate insight.    Therapeutic Modalities:   Cognitive Behavioral Therapy Person-Centered Therapy Motivational Interviewing   [employer], LCSW 04/03/2017 1:25 PM

## 2017-04-04 DIAGNOSIS — F1290 Cannabis use, unspecified, uncomplicated: Secondary | ICD-10-CM

## 2017-04-04 DIAGNOSIS — F419 Anxiety disorder, unspecified: Secondary | ICD-10-CM

## 2017-04-04 DIAGNOSIS — F332 Major depressive disorder, recurrent severe without psychotic features: Secondary | ICD-10-CM

## 2017-04-04 DIAGNOSIS — T391X2A Poisoning by 4-Aminophenol derivatives, intentional self-harm, initial encounter: Secondary | ICD-10-CM

## 2017-04-04 DIAGNOSIS — T1491XA Suicide attempt, initial encounter: Secondary | ICD-10-CM
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
## 2017-04-04 NOTE — [facility] Group Notes (Signed)
LCSW Group Therapy Note  04/04/2017 1:15pm  Type of Therapy and Topic:  Group Therapy:  Feelings around Relapse and Recovery  Participation Level: Active   Description of Group:    Patients in this group will discuss emotions they experience before and after a relapse. They will process how experiencing these feelings, or avoidance of experiencing them, relates to having a relapse. Facilitator will guide patients to explore emotions they have related to recovery. Patients will be encouraged to process which emotions are more powerful. They will be guided to discuss the emotional reaction significant others in their lives may have to their relapse or recovery. Patients will be assisted in exploring ways to respond to the emotions of others without this contributing to a relapse.  Therapeutic Goals: 1. Patient will identify two or more emotions that lead to a relapse for them 2. Patient will identify two emotions that result when they relapse 3. Patient will identify two emotions related to recovery 4. Patient will demonstrate ability to communicate their needs through discussion and/or role plays   Summary of Patient Progress: Pt was present for the entire duration of the group. Pt states that she doesn't believe that she has a problem because she only snorts pills when she is feeling depressed. Pt states that she feels she is not ready to change currently but she does recognize that her behavior is risky and could result in negative outcomes. Pt states that she wants to be a good mother to her son but she feels as if she has failed him because she hasn't finished her education. At the end of the group after receiving encouragement from her peers, pt stated that she hopes she can change things around for the better.    Therapeutic Modalities:   Cognitive Behavioral Therapy Solution-Focused Therapy Assertiveness Training Relapse Prevention Therapy   Jonathon Jordan, MSW, LCSWA 04/04/2017  3:21 PM
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
## 2017-04-04 NOTE — Progress Notes (Signed)
Recreation Therapy Notes  Date: 04/04/17 Time: 0930 Location: [address] Dayroom  Group Topic: Stress Management  Goal Area(s) Addresses:  Patient will verbalize importance of using healthy stress management.  Patient will identify positive emotions associated with healthy stress management.   Intervention: Stress Management  Activity :  Progressive Muscle Relaxation.  LRT introduced the stress management technique of progressive muscle relaxation.  LRT read a script to guide patients through tensing and relaxing each muscle group.  Patients were to follow along as script was read to engage in the activity.  Education: Stress Management, Discharge Planning.   Education Outcome: Acknowledges edcuation/In group clarification offered/Needs additional education  Clinical Observations/Feedback: Pt did not attend group.   Caroll Rancher, LRT/CTRS        Caroll Rancher A 04/04/2017 11:34 AM

## 2017-04-04 NOTE — Progress Notes (Signed)
Patient ID: Keyonda Bickle Necaise, female   DOB: 05-26-1996, 21 y.o.   MRN: [id number]  DAR: Pt. Denies SI/HI and A/V Hallucinations. She reports her sleep last night was poor, appetite is good, energy level is normal, and concentration is good. She rates her depression level is 5/10, hopelessness level is 8/10, and anxiety level is 6/10. Patient does report some pain this morning and received PRN medication. Support and encouragement provided to the patient although patient is minimal. Scheduled medications administered to patient per physician's orders. PRN Vistaril administered to patient this afternoon, patient reports she is anxious because she wishes she was at home. Patient is seen in the milieu intermittently however she appears withdrawn from her peers. Her affect is flat and depressed at this time. Q15 minute checks are maintained for safety.

## 2017-04-05 MED ORDER — DOCUSATE SODIUM 100 MG PO CAPS
100.0000 mg | ORAL_CAPSULE | Freq: Every day | ORAL | Status: DC
  Administered 2017-04-05 – 2017-04-06 (×2): 100 mg via ORAL
  Filled 2017-04-05 (×4): qty 1

## 2017-04-05 MED ORDER — TRAZODONE HCL 50 MG PO TABS
50.0000 mg | ORAL_TABLET | Freq: Every day | ORAL | Status: DC
  Administered 2017-04-05: 50 mg via ORAL
  Filled 2017-04-05 (×3): qty 1

## 2017-04-05 NOTE — [facility] Group Notes (Signed)
LCSW Group Therapy Note  04/05/2017     10:00-11:00AM  Type of Therapy and Topic:  Group Therapy:  Decisional Balance/Substance Use  Participation Level:  Did Not Attend        . Description of Group:  The main focus of today's process group was learning how to use a decisional balance exercise to make a decision about whether to change an unhealthy coping skill, as well as how to use the information gathered in the actual process of planning that change.  Patients listed some of their most frequently utilized unhealthy coping techniques and CSW pointed out the similarities.  Motivational Interviewing and the whiteboard were utilized to help patients explore in-depth the perceived benefits and costs of a specific, shared unhealthy coping technique (drinking & drugging) as well as the benefits and costs of replacing that with other, healthy coping skills.  A handout was distributed for patients to be able to do this exercise for themselves.     Therapeutic Goals 1. Patient will be able to utilize the decision balance exercise on their own 2. Patient will list coping skills they use to fulfill their needs 3. Patient will identify the differences between healthy and  unhealthy coping skills 4. Patient will verbalize the costs and benefits of drinking/drugging versus making the choice to change 5. Patient will learn how to use the exercise to identify the most important supports to put in place so that they can succeed in a change to which they commit  Summary of Patient Progress: N/A   Therapeutic Modalities Cognitive Behavioral Therapy Motivational Interviewing   Nyiesha Beever J Grossman-Orr, LCSW   

## 2017-04-05 NOTE — Progress Notes (Addendum)
Patient ID: Jenna [name], female   DOB: 12/02/1995, 21 y.o.   MRN: [id number] D: Pt observed in the dayroom not interacting. Pt at the time of assessment endorsed moderate depression; "I depression today than I did yesterday; I don't understand."-denied SI. Pt also denied pain, anxiety, or HI.  A: Medications offered as prescribed. Pt given the opportunity to ask questions and state concerns. Support, encouragement, and safe environment provided. R: Pt was med compliant. All patient's questions and concerns addressed. 15-minute safety checks continue. Safety checks continue. Pt did attend AA group.

## 2017-04-05 NOTE — Progress Notes (Signed)
D: Patient's self inventory sheet: patient has poor sleep, received sleep medication.good  Appetite, low energy level, poor concentration. Rated depression 1/10, hopeless 1/10, anxiety 8/10. SI/HI/AVH: denies all. Physical complaints are pain in arms 10/10. Goal is "making progression". Plans to work on "come out of my room".   A: Medications administered, assessed medication knowledge and education given on medication regimen.  Emotional support and encouragement given patient. R: Denies SI and HI , contracts for safety. Safety maintained with 15 minute checks.
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
## 2017-04-06 DIAGNOSIS — F1210 Cannabis abuse, uncomplicated: Secondary | ICD-10-CM

## 2017-04-06 MED ORDER — TRAZODONE HCL 50 MG PO TABS
50.0000 mg | ORAL_TABLET | Freq: Every day | ORAL | Status: DC
  Administered 2017-04-06: 50 mg via ORAL
  Filled 2017-04-06 (×3): qty 1

## 2017-04-06 MED ORDER — TRAZODONE HCL 50 MG PO TABS: 50.0000 mg | ORAL_TABLET | Freq: Every evening | ORAL | Status: DC | PRN

## 2017-04-06 MED ORDER — FLUOXETINE HCL 20 MG PO CAPS
20.0000 mg | ORAL_CAPSULE | Freq: Every day | ORAL | Status: DC
  Administered 2017-04-07: 20 mg via ORAL
  Filled 2017-04-06 (×3): qty 1

## 2017-04-06 NOTE — [facility] Group Notes (Signed)
[facility] Group Notes:  (Nursing/MHT/Case Management/Adjunct)  Date:  04/06/2017  Time:  1400  Type of Therapy:  Nurse Education  Participation Level:  Active  Participation Quality:  Appropriate and Attentive  Affect:  Appropriate  Cognitive:  Alert and Appropriate  Insight:  Improving  Engagement in Group:  Engaged  Modes of Intervention:  Discussion and Guided Meditation  Summary of Progress/Problems:  Topic discussed on stress and stress management. Pt was appropriative and participated [name] with group topic.    Jenna [name] V Jenna [name] 04/06/2017, 2:43 PM  

## 2017-04-06 NOTE — Progress Notes (Signed)
D    Pt is sad and depressed   She interacts minimally with peers and attends most groups   She reports anxiety and depression   She is compliant with treatment A    Verbal support given   Medications administered and effectiveness monitored    Q 15 min checks  R   Pt is safe at present

## 2017-04-06 NOTE — Progress Notes (Signed)
Patient ID: Jenna [name], female   DOB: [birth year], 21 y.o.   MRN: [id number]   D: Assumed care of patient @ 2330. Patient in bed sleeping. Respiration regular and unlabored. No sign of distress noted at this time A: 15 mins checks for safety. R: Patient remains safe.

## 2017-04-06 NOTE — [facility] Group Notes (Signed)
[facility] LCSW Group Therapy Note  Date/Time:  04/06/2017 10:00-11:00AM  Type of Therapy and Topic:  Group Therapy:  Healthy and Unhealthy Supports  Participation Level:  Did Not Attend   Description of Group:  Patients in this group were introduced to the idea of adding a variety of healthy supports to address the various needs in their lives. The picture on the front of Sunday's workbook was used to demonstrate why more supports are needed in every patient's life.  Patients identified and described healthy supports versus unhealthy supports in general, then gave examples of each in their own lives.   They discussed what additional healthy supports could be helpful in their recovery and wellness after discharge in order to prevent future hospitalizations.   An emphasis was placed on using counselor, doctor, therapy groups, 12-step groups, and problem-specific support groups to expand supports.  They also worked as a group on developing a specific plan for several patients to deal with unhealthy supports through boundary-setting, psychoeducation with loved ones, and even termination of relationships.   Therapeutic Goals:   1)  discuss importance of adding supports to stay well once out of the hospital  2)  compare healthy versus unhealthy supports and identify some examples of each  3)  generate ideas and descriptions of healthy supports that can be added  4)  offer mutual support about how to address unhealthy supports  5)  encourage active participation in and adherence to discharge plan    Summary of Patient Progress:  N/A   Therapeutic Modalities:   Motivational Interviewing Brief Solution-Focused Therapy  Ambrose Mantle, LCSW 04/06/2017, 8:28 AM

## 2017-04-06 NOTE — Progress Notes (Signed)
Patient present in the milieu, interacting with peers and attending groups and activities. Reports improved mood and feelings of optimism.
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
## 2017-04-07 MED ORDER — NICOTINE 21 MG/24HR TD PT24: 21.0000 mg | MEDICATED_PATCH | Freq: Every day | TRANSDERMAL | 0 refills | Status: DC

## 2017-04-07 MED ORDER — HYDROXYZINE HCL 25 MG PO TABS: 25.0000 mg | ORAL_TABLET | ORAL | 0 refills | Status: DC | PRN

## 2017-04-07 MED ORDER — FLUOXETINE HCL 20 MG PO CAPS: 20.0000 mg | ORAL_CAPSULE | Freq: Every day | ORAL | 0 refills | Status: DC

## 2017-04-07 MED ORDER — TRAZODONE HCL 50 MG PO TABS
50.0000 mg | ORAL_TABLET | Freq: Every day | ORAL | 0 refills | Status: DC
Start: 2017-04-07 — End: 2017-05-23

## 2017-04-07 MED ORDER — AMLODIPINE BESYLATE 10 MG PO TABS: 10.0000 mg | ORAL_TABLET | Freq: Every day | ORAL | 1 refills | Status: DC

## 2017-04-07 MED ORDER — DOCUSATE SODIUM 100 MG PO CAPS: 100.0000 mg | ORAL_CAPSULE | Freq: Every day | ORAL | 0 refills | Status: DC

## 2017-04-07 MED ORDER — TRIAMTERENE-HCTZ 37.5-25 MG PO TABS: 1.0000 | ORAL_TABLET | Freq: Every day | ORAL | Status: DC

## 2017-04-07 NOTE — Progress Notes (Signed)
Recreation Therapy Notes  Date: 04/07/17 Time: 0930 Location: [address] Dayroom  Group Topic: Stress Management  Goal Area(s) Addresses:  Patient will verbalize importance of using healthy stress management.  Patient will identify positive emotions associated with healthy stress management.   Behavioral Response: Engaged  Intervention: Stress Management  Activity :  Meditation.  LRT introduced the stress management technique of meditation.  LRT played a meditation from the Calm app that explained stress and how we take on and exhibit stress throughout the body.  Patients were to listen and follow along as the meditation played to engage in the technique.  Education:  Stress Management, Discharge Planning.   Education Outcome: Acknowledges edcuation/In group clarification offered/Needs additional education  Clinical Observations/Feedback: Pt attended group.    Caroll Rancher, LRT/CTRS        Caroll Rancher A 04/07/2017 12:15 PM

## 2017-04-07 NOTE — [facility] Suicide Risk Assessment (Addendum)
[facility] Discharge Suicide Risk Assessment   Principal Problem: Post-partum depression Discharge Diagnoses:  Patient Active Problem List   Diagnosis Date Noted  . Post-partum depression [F53.0] 04/03/2017  . MDD (major depressive disorder), recurrent severe, without psychosis ([facility]) [F33.2] 04/02/2017  . Severe preeclampsia, postpartum condition [O14.15] 02/28/2017  . Status post primary low transverse cesarean section [Z98.891] 02/24/2017  . Indication for care in labor or delivery [O75.9] 02/20/2017  . Trichomonal vaginitis during pregnancy in third trimester [O23.593, A59.01] 01/31/2017  . GBS (group B Streptococcus carrier), +RV culture, currently pregnant [O99.820] 01/31/2017  . History of pelvic fracture [Z87.81] 01/28/2017  . Insufficient prenatal care in third trimester [O09.33] 01/02/2017  . Social problem [Z65.9] 01/02/2017  . Tobacco smoking affecting pregnancy, antepartum [O99.330] 10/27/2016  . Abnormal maternal serum screening test [O28.0] 10/10/2016  . Supervision of other normal pregnancy, antepartum [Z34.80] 03/06/2016    Total Time spent with patient: 30 minutes  Musculoskeletal: Strength & Muscle Tone: within normal limits Gait & Station: normal Patient leans: N/A  Psychiatric Specialty Exam: ROS denies headache, no chest pain, no shortness of breath, no vomiting, denies dysuria, no fever, no chills   Blood pressure (!) 156/134, pulse (!) 116, temperature 98.7 F (37.1 C), temperature source Oral, resp. rate 12, height  (1.676 m), weight 78.9 kg (174 lb), last menstrual period 03/28/2017, SpO2 100 %, not currently breastfeeding.Body mass index is 28.08 kg/m.  Repeat vitals - 123/76, pulse 74   General Appearance: improved grooming   Eye Contact::  Good  Speech:  Normal Rate409  Volume:  Normal  Mood:  reports her mood is better, and states " I have felt a lot better compared to how I felt before I got here "  Affect:  remains constricted, but reactive, and  improves during session  Thought Process:  Linear and Descriptions of Associations: Intact  Orientation:  Other:  fully alert and attentive   Thought Content:  no hallucinations, no delusions. Not internally preoccupied   Suicidal Thoughts:  No denies any suicidal or self injurious ideations, denies any homicidal or violent ideations , specifically also denies any violent or homicidal ideations towards child   Homicidal Thoughts:  No  Memory:  recent and remote grossly intact   Judgement:  Other:  improving   Insight:  fair- improving   Psychomotor Activity:  Normal  Concentration:  Good  Recall:  Good  Fund of Knowledge:Good  Language: Good  Akathisia:  Negative  Handed:  Right  AIMS (if indicated):     Assets:  Desire for Improvement Resilience  Sleep:  Number of Hours: 5.75  Cognition: WNL  ADL's:  Intact   Mental Status Per Nursing Assessment::   On Admission:  Suicidal ideation indicated by patient, Self-harm thoughts  Demographic Factors:  21 year old female, lives with aunt and cousin, 6 weeks post partum   Loss Factors: States her son had excessive bleeding during a routine circumcision   Historical Factors: No prior psychiatric admissions, no history of suicide attempts, no history of psychosis   Risk Reduction Factors:   Responsible for children under 2 years of age, Sense of responsibility to family, Living with another person, especially a relative and Positive coping skills or problem solving skills  Continued Clinical Symptoms:  Patient is alert, attentive, well related, calm. Mood remains somewhat depressed,but has improved compared to admission and states she is feeling better than before his admission. Affect remains constricted, but is reactive, and improves significantly during session, no thought  disorder, no suicidal ideations, denies any self injurious ideations, and states the love she feels for her son is a protective factor against considering suicide  . Denies any homicidal or violent ideations. No hallucinations, no delusions, future oriented . States she found out her son is being discharged from the hospital today and returning home .She feels strongly she is ready for discharge and states " I just want to be home for him, and to take care of him".   Cognitive Features That Contribute To Risk:  No gross cognitive deficits noted upon discharge. Is alert , attentive, and oriented x 3   Suicide Risk:  Mild:  Suicidal ideation of limited frequency, intensity, duration, and specificity.  There are no identifiable plans, no associated intent, mild dysphoria and related symptoms, good self-control (both objective and subjective assessment), few other risk factors, and identifiable protective factors, including available and accessible social support.  Follow-up Information    [facility] Follow up on 04/10/2017.   Specialty:  Behavioral Health Why:  Appt on Thursday at 8:15AM with Todd. Please bring Photo ID and Medicaid card to this appt. Thank you.  Contact information: [address] [place] [address] [phone]        Dr. Ronne Binning MD-Pain Management Follow up on 04/09/2017.   Why:  Hospital follow-up with Dr. Ronne Binning on this date at 2:00PM. Please bring hospital discharge paperwork with you to this appt. Thank you.  Contact information: [address] [place], [place] [address] Phone: [phone] Fax: [phone]          Plan Of Care/Follow-up recommendations:  Activity:  as tolerated Diet:  regular Tests:  NA Other:   see below   Patient is requesting discharge and there are no current grounds for involuntary commitment She is leaving unit in good spirits Plans to return home  Plans to follow up as above    Craige Cotta, MD 04/07/2017, 1:12 PM
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
## 2017-04-07 NOTE — Progress Notes (Signed)
DAR NOTE: Patient presents with anxious affect and depressed mood. Pt not willing to talk with staff this morning, pt stated later that she want to be discharged, her 21 weeks old  son is coming from the hospital and would like to be there for him. Pt has had crying spells, but was easily redirectable. Denies pain, auditory and visual hallucinations.  Rates depression at 1, hopelessness at 1, and anxiety at 2.  Maintained on routine safety checks.  Medications given as prescribed.  Support and encouragement offered as needed.  Attended group and participated.  States goal for today is "be happy."  Patient observed socializing with peers in the dayroom, will continue to monitor.

## 2017-04-07 NOTE — Progress Notes (Signed)
  [facility] Adult Case Management Discharge Plan :  Will you be returning to the same living situation after discharge:  Yes,  pt returning home. At discharge, do you have transportation home?: Yes,  pt has access to transportation. Do you have the ability to pay for your medications: Yes,  prescriptions and samples provided.  Release of information consent forms completed and in the chart;  Patient's signature needed at discharge.  Patient to Follow up at: Follow-up Information    [facility] Follow up on 04/10/2017.   Specialty:  Behavioral Health Why:  Appt on Thursday at 8:15AM with Todd. Please bring Photo ID and Medicaid card to this appt. Thank you.  Contact information: [address] [place] [address] [phone]        Dr. Ronne Binning MD-Pain Management Follow up on 04/09/2017.   Why:  Hospital follow-up with Dr. Ronne Binning on this date at 2:00PM. Please bring hospital discharge paperwork with you to this appt. Thank you.  Contact information: [address] [place], [place] [address] Phone: [phone] Fax: [phone]          Next level of care provider has access to [facility] Link:no  Safety Planning and Suicide Prevention discussed: Yes,  with pt and pt's cousin.  Have you used any form of tobacco in the last 30 days? (Cigarettes, Smokeless Tobacco, Cigars, and/or Pipes): Yes  Has patient been referred to the Quitline?: Patient refused referral  Patient has been referred for addiction treatment: Yes   Pt declined residential substance use treatment.  Jonathon Jordan, MSW, LCSWA 04/07/2017, 1:35 PM

## 2017-04-07 NOTE — Progress Notes (Signed)
Pt discharged home with her brother. Pt was ambulatory,  stable and appreciative at that time. All papers and prescriptions were given and valuables returned. Verbal understanding expressed. Denies SI/HI and A/VH. Pt given opportunity to express concerns and ask questions.

## 2017-04-29 ENCOUNTER — Ambulatory Visit: Payer: Medicaid Other | Admitting: Certified Nurse Midwife

## 2017-05-23 ENCOUNTER — Other Ambulatory Visit: Payer: Self-pay | Admitting: Advanced Practice Midwife

## 2017-05-23 ENCOUNTER — Encounter (HOSPITAL_COMMUNITY): Payer: Self-pay

## 2017-05-23 ENCOUNTER — Inpatient Hospital Stay (HOSPITAL_COMMUNITY): Payer: Medicaid Other

## 2017-05-23 ENCOUNTER — Inpatient Hospital Stay (EMERGENCY_DEPARTMENT_HOSPITAL)
Admission: AD | Admit: 2017-05-23 | Discharge: 2017-05-23 | Disposition: A | Discharge status: Home / Self Care | Payer: Medicaid Other | Source: Ambulatory Visit | Attending: Obstetrics and Gynecology | Admitting: Obstetrics and Gynecology

## 2017-05-23 ENCOUNTER — Inpatient Hospital Stay (HOSPITAL_COMMUNITY)
Admission: AD | Admit: 2017-05-23 | Discharge: 2017-05-23 | Disposition: A | Discharge status: Home / Self Care | Payer: Medicaid Other | Attending: Obstetrics and Gynecology | Admitting: Obstetrics and Gynecology

## 2017-05-23 DIAGNOSIS — O99331 Smoking (tobacco) complicating pregnancy, first trimester: Secondary | ICD-10-CM | POA: Diagnosis not present

## 2017-05-23 DIAGNOSIS — R109 Unspecified abdominal pain: Secondary | ICD-10-CM

## 2017-05-23 DIAGNOSIS — N760 Acute vaginitis: Secondary | ICD-10-CM | POA: Diagnosis not present

## 2017-05-23 DIAGNOSIS — B9689 Other specified bacterial agents as the cause of diseases classified elsewhere: Secondary | ICD-10-CM

## 2017-05-23 DIAGNOSIS — O23591 Infection of other part of genital tract in pregnancy, first trimester: Secondary | ICD-10-CM | POA: Diagnosis not present

## 2017-05-23 DIAGNOSIS — F329 Major depressive disorder, single episode, unspecified: Secondary | ICD-10-CM | POA: Insufficient documentation

## 2017-05-23 DIAGNOSIS — Z3A01 Less than 8 weeks gestation of pregnancy: Secondary | ICD-10-CM | POA: Insufficient documentation

## 2017-05-23 DIAGNOSIS — F419 Anxiety disorder, unspecified: Secondary | ICD-10-CM | POA: Diagnosis not present

## 2017-05-23 DIAGNOSIS — Z3491 Encounter for supervision of normal pregnancy, unspecified, first trimester: Secondary | ICD-10-CM

## 2017-05-23 DIAGNOSIS — Z79899 Other long term (current) drug therapy: Secondary | ICD-10-CM | POA: Diagnosis not present

## 2017-05-23 DIAGNOSIS — N898 Other specified noninflammatory disorders of vagina: Secondary | ICD-10-CM | POA: Diagnosis present

## 2017-05-23 DIAGNOSIS — O99341 Other mental disorders complicating pregnancy, first trimester: Secondary | ICD-10-CM | POA: Diagnosis not present

## 2017-05-23 DIAGNOSIS — F17210 Nicotine dependence, cigarettes, uncomplicated: Secondary | ICD-10-CM | POA: Diagnosis not present

## 2017-05-23 DIAGNOSIS — O26891 Other specified pregnancy related conditions, first trimester: Secondary | ICD-10-CM | POA: Diagnosis not present

## 2017-05-23 DIAGNOSIS — O34219 Maternal care for unspecified type scar from previous cesarean delivery: Secondary | ICD-10-CM | POA: Insufficient documentation

## 2017-05-23 LAB — CBC
HEMATOCRIT: 33.1 % — AB (ref 36.0–46.0)
Hemoglobin: 10.6 g/dL — ABNORMAL LOW (ref 12.0–15.0)
MCH: 26.3 pg (ref 26.0–34.0)
MCHC: 32 g/dL (ref 30.0–36.0)
MCV: 82.1 fL (ref 78.0–100.0)
Platelets: 328 10*3/uL (ref 150–400)
RBC: 4.03 MIL/uL (ref 3.87–5.11)
RDW: 15.9 % — AB (ref 11.5–15.5)
WBC: 9.7 10*3/uL (ref 4.0–10.5)

## 2017-05-23 LAB — URINALYSIS, ROUTINE W REFLEX MICROSCOPIC
Bilirubin Urine: NEGATIVE
GLUCOSE, UA: NEGATIVE mg/dL
Hgb urine dipstick: NEGATIVE
Ketones, ur: NEGATIVE mg/dL
LEUKOCYTES UA: NEGATIVE
Nitrite: NEGATIVE
PROTEIN: NEGATIVE mg/dL
Specific Gravity, Urine: 1.013 (ref 1.005–1.030)
pH: 6 (ref 5.0–8.0)

## 2017-05-23 LAB — WET PREP, GENITAL
Sperm: NONE SEEN
TRICH WET PREP: NONE SEEN
Yeast Wet Prep HPF POC: NONE SEEN

## 2017-05-23 LAB — POCT PREGNANCY, URINE: PREG TEST UR: POSITIVE — AB

## 2017-05-23 LAB — HCG, QUANTITATIVE, PREGNANCY: hCG, Beta Chain, Quant, S: 95607 m[IU]/mL — ABNORMAL HIGH (ref <5)

## 2017-05-23 MED ORDER — METRONIDAZOLE 500 MG PO TABS: 500.0000 mg | ORAL_TABLET | Freq: Two times a day (BID) | ORAL | 0 refills | Status: DC

## 2017-05-23 NOTE — Discharge Instructions (Signed)
Abdominal Pain During Pregnancy Belly (abdominal) pain is common during pregnancy. Most of the time, it is not a serious problem. Other times, it can be a sign that something is wrong with the pregnancy. Always tell your doctor if you have belly pain. Follow these instructions at home: Monitor your belly pain for any changes. The following actions may help you feel better:  Do not have sex (intercourse) or put anything in your vagina until you feel better.  Rest until your pain stops.  Drink clear fluids if you feel sick to your stomach (nauseous). Do not eat solid food until you feel better.  Only take medicine as told by your doctor.  Keep all doctor visits as told.  Get help right away if:  You are bleeding, leaking fluid, or pieces of tissue come out of your vagina.  You have more pain or cramping.  You keep throwing up (vomiting).  You have pain when you pee (urinate) or have blood in your pee.  You have a fever.  You do not feel your baby moving as much.  You feel very weak or feel like passing out.  You have trouble breathing, with or without belly pain.  You have a very bad headache and belly pain.  You have fluid leaking from your vagina and belly pain.  You keep having watery poop (diarrhea).  Your belly pain does not go away after resting, or the pain gets worse. This information is not intended to replace advice given to you by your health care provider. Make sure you discuss any questions you have with your health care provider. Document Released: 06/05/2009 Document Revised: 01/24/2016 Document Reviewed: 01/14/2013 Elsevier Interactive Patient Education  2018 [employer]   Bacterial Vaginosis Bacterial vaginosis is a vaginal infection that occurs when the normal balance of bacteria in the vagina is disrupted. It results from an overgrowth of certain bacteria. This is the most common vaginal infection among women ages 2215-44. Because bacterial vaginosis  increases your risk for STIs (sexually transmitted infections), getting treated can help reduce your risk for chlamydia, gonorrhea, herpes, and HIV (human immunodeficiency virus). Treatment is also important for preventing complications in pregnant women, because this condition can cause an early (premature) delivery. What are the causes? This condition is caused by an increase in harmful bacteria that are normally present in small amounts in the vagina. However, the reason that the condition develops is not fully understood. What increases the risk? The following factors may make you more likely to develop this condition:  Having a new sexual partner or multiple sexual partners.  Having unprotected sex.  Douching.  Having an intrauterine device (IUD).  Smoking.  Drug and alcohol abuse.  Taking certain antibiotic medicines.  Being pregnant.  You cannot get bacterial vaginosis from toilet seats, bedding, swimming pools, or contact with objects around you. What are the signs or symptoms? Symptoms of this condition include:  Grey or white vaginal discharge. The discharge can also be watery or foamy.  A fish-like odor with discharge, especially after sexual intercourse or during menstruation.  Itching in and around the vagina.  Burning or pain with urination.  Some women with bacterial vaginosis have no signs or symptoms. How is this diagnosed? This condition is diagnosed based on:  Your medical history.  A physical exam of the vagina.  Testing a sample of vaginal fluid under a microscope to look for a large amount of bad bacteria or abnormal cells. Your health care provider may  use a cotton swab or a small wooden spatula to collect the sample.  How is this treated? This condition is treated with antibiotics. These may be given as a pill, a vaginal cream, or a medicine that is put into the vagina (suppository). If the condition comes back after treatment, a second round of  antibiotics may be needed. Follow these instructions at home: Medicines  Take over-the-counter and prescription medicines only as told by your health care provider.  Take or use your antibiotic as told by your health care provider. Do not stop taking or using the antibiotic even if you start to feel better. General instructions  If you have a female sexual partner, tell her that you have a vaginal infection. She should see her health care provider and be treated if she has symptoms. If you have a female sexual partner, he does not need treatment.  During treatment: ? Avoid sexual activity until you finish treatment. ? Do not douche. ? Avoid alcohol as directed by your health care provider. ? Avoid breastfeeding as directed by your health care provider.  Drink enough water and fluids to keep your urine clear or pale yellow.  Keep the area around your vagina and rectum clean. ? Wash the area daily with warm water. ? Wipe yourself from front to back after using the toilet.  Keep all follow-up visits as told by your health care provider. This is important. How is this prevented?  Do not douche.  Wash the outside of your vagina with warm water only.  Use protection when having sex. This includes latex condoms and dental dams.  Limit how many sexual partners you have. To help prevent bacterial vaginosis, it is best to have sex with just one partner (monogamous).  Make sure you and your sexual partner are tested for STIs.  Wear cotton or cotton-lined underwear.  Avoid wearing tight pants and pantyhose, especially during summer.  Limit the amount of alcohol that you drink.  Do not use any products that contain nicotine or tobacco, such as cigarettes and e-cigarettes. If you need help quitting, ask your health care provider.  Do not use illegal drugs. Where to find more information:  Centers for Disease Control and Prevention: SolutionApps.co.zawww.cdc.gov/std  American Sexual Health Association  (ASHA): www.ashastd.org  U.S. Department of Health and Health and safety inspectorHuman Services, Office on Women's Health: ConventionalMedicines.siwww.womenshealth.gov/ or http://www.anderson-williamson.info/https://www.womenshealth.gov/a-z-topics/bacterial-vaginosis Contact a health care provider if:  Your symptoms do not improve, even after treatment.  You have more discharge or pain when urinating.  You have a fever.  You have pain in your abdomen.  You have pain during sex.  You have vaginal bleeding between periods. Summary  Bacterial vaginosis is a vaginal infection that occurs when the normal balance of bacteria in the vagina is disrupted.  Because bacterial vaginosis increases your risk for STIs (sexually transmitted infections), getting treated can help reduce your risk for chlamydia, gonorrhea, herpes, and HIV (human immunodeficiency virus). Treatment is also important for preventing complications in pregnant women, because the condition can cause an early (premature) delivery.  This condition is treated with antibiotic medicines. These may be given as a pill, a vaginal cream, or a medicine that is put into the vagina (suppository). This information is not intended to replace advice given to you by your health care provider. Make sure you discuss any questions you have with your health care provider. Document Released: 06/17/2005 Document Revised: 03/02/2016 Document Reviewed: 03/02/2016 Elsevier Interactive Patient Education  2017 [employer]

## 2017-05-23 NOTE — MAU Note (Signed)
Patient presents with fishy white discharge x 2 weeks, positive pregnancy test. No period since C/S on 02/21/17
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
## 2017-05-26 LAB — GC/CHLAMYDIA PROBE AMP (~~LOC~~) NOT AT ARMC
CHLAMYDIA, DNA PROBE: POSITIVE — AB
Neisseria Gonorrhea: NEGATIVE

## 2017-06-01 ENCOUNTER — Other Ambulatory Visit: Payer: Self-pay | Admitting: Advanced Practice Midwife

## 2017-06-01 DIAGNOSIS — A749 Chlamydial infection, unspecified: Secondary | ICD-10-CM

## 2017-06-01 DIAGNOSIS — O98819 Other maternal infectious and parasitic diseases complicating pregnancy, unspecified trimester: Secondary | ICD-10-CM

## 2017-06-01 DIAGNOSIS — O98811 Other maternal infectious and parasitic diseases complicating pregnancy, first trimester: Principal | ICD-10-CM

## 2017-06-01 MED ORDER — AZITHROMYCIN 500 MG PO TABS: 1000.0000 mg | ORAL_TABLET | Freq: Once | ORAL | 0 refills | Status: AC

## 2017-09-03 ENCOUNTER — Ambulatory Visit (INDEPENDENT_AMBULATORY_CARE_PROVIDER_SITE_OTHER): Payer: Medicaid Other | Admitting: Obstetrics and Gynecology

## 2017-09-03 ENCOUNTER — Ambulatory Visit: Payer: Medicaid Other

## 2017-09-03 ENCOUNTER — Encounter: Payer: Self-pay | Admitting: Obstetrics and Gynecology

## 2017-09-03 ENCOUNTER — Other Ambulatory Visit (HOSPITAL_COMMUNITY)
Admission: RE | Admit: 2017-09-03 | Discharge: 2017-09-03 | Disposition: A | Discharge status: Home / Self Care | Payer: Medicaid Other | Source: Ambulatory Visit | Attending: Obstetrics and Gynecology | Admitting: Obstetrics and Gynecology

## 2017-09-03 VITALS — BP 109/69 | HR 81 | Wt 189.1 lb

## 2017-09-03 DIAGNOSIS — Z98891 History of uterine scar from previous surgery: Secondary | ICD-10-CM

## 2017-09-03 DIAGNOSIS — R87810 Cervical high risk human papillomavirus (HPV) DNA test positive: Secondary | ICD-10-CM | POA: Diagnosis not present

## 2017-09-03 DIAGNOSIS — R87610 Atypical squamous cells of undetermined significance on cytologic smear of cervix (ASC-US): Secondary | ICD-10-CM | POA: Diagnosis not present

## 2017-09-03 DIAGNOSIS — Z3482 Encounter for supervision of other normal pregnancy, second trimester: Secondary | ICD-10-CM

## 2017-09-03 DIAGNOSIS — O0932 Supervision of pregnancy with insufficient antenatal care, second trimester: Secondary | ICD-10-CM

## 2017-09-03 DIAGNOSIS — B373 Candidiasis of vulva and vagina: Secondary | ICD-10-CM | POA: Diagnosis not present

## 2017-09-03 DIAGNOSIS — Z8781 Personal history of (healed) traumatic fracture: Secondary | ICD-10-CM

## 2017-09-03 DIAGNOSIS — Z3490 Encounter for supervision of normal pregnancy, unspecified, unspecified trimester: Secondary | ICD-10-CM | POA: Insufficient documentation

## 2017-09-03 DIAGNOSIS — O0930 Supervision of pregnancy with insufficient antenatal care, unspecified trimester: Secondary | ICD-10-CM | POA: Insufficient documentation

## 2017-09-03 NOTE — Progress Notes (Signed)
Subjective:  Jenna [name] is a 22 y.o. [id number] at 3225w6d being seen today for first OB visit. Pt delivered in September 2018 via LTCS d/t FTP Did not have a PP visit and no contraception. EDD by first trimester U/S.  H/O pelvic fracture but no hardware in place.   She is currently monitored for the following issues for this high-risk pregnancy and has Tobacco smoking affecting pregnancy, antepartum; Social problem; History of pelvic fracture; MDD (major depressive disorder), recurrent severe, without psychosis (HCC); Post-partum depression; Encounter for supervision of normal pregnancy, unspecified, unspecified trimester; History of cesarean section; and Late prenatal care on their problem list.  Patient reports no complaints.  Contractions: Irritability. Vag. Bleeding: None.  Movement: Present. Denies leaking of fluid.   The following portions of the patient's history were reviewed and updated as appropriate: allergies, current medications, past family history, past medical history, past social history, past surgical history and problem list. Problem list updated.  Objective:   Vitals:   09/03/17 1433  BP: 109/69  Pulse: 81  Weight: 85.8 kg (189 lb 1.6 oz)    Fetal Status:     Movement: Present     General:  Alert, oriented and cooperative. Patient is in no acute distress.  Skin: Skin is warm and dry. No rash noted.   Cardiovascular: Normal heart rate noted  Respiratory: Normal respiratory effort, no problems with respiration noted  Abdomen: Soft, gravid, appropriate for gestational age. Pain/Pressure: Absent     Pelvic:  Cervical exam performed        Extremities: Normal range of motion.  Edema: None  Mental Status: Normal mood and affect. Normal behavior. Normal judgment and thought content.   Urinalysis:      Assessment and Plan:  Pregnancy: [id number] at 5625w6d  1. Encounter for supervision of normal pregnancy, antepartum, unspecified gravidity Prenatal care and labs reviewed  with pt Anatomy scan  Continue with PNV Declines genetics and flu vaccine  2. History of cesarean section Will need to discuss delivery route at future appts  3. History of pelvic fracture  4. Late prenatal care   Preterm labor symptoms and general obstetric precautions including but not limited to vaginal bleeding, contractions, leaking of fluid and fetal movement were reviewed in detail with the patient. Please refer to After Visit Summary for other counseling recommendations.  Return in about 4 weeks (around 10/01/2017) for OB visit.   Hermina StaggersErvin, Donabelle Molden L, MD

## 2017-09-03 NOTE — Progress Notes (Signed)
Pt here for a Pregnancy test. Test today is positive. Pt states that she did not wait her 6 weeks postpartum to start having sex again. States that she never had a cycle after delivery. Pt seen for a NEW OB today

## 2017-09-03 NOTE — Patient Instructions (Signed)

## 2017-09-05 LAB — OBSTETRIC PANEL, INCLUDING HIV
Antibody Screen: NEGATIVE
Basophils Absolute: 0 10*3/uL (ref 0.0–0.2)
Basos: 0 %
EOS (ABSOLUTE): 0.2 10*3/uL (ref 0.0–0.4)
EOS: 2 %
HEMOGLOBIN: 10.3 g/dL — AB (ref 11.1–15.9)
HEP B S AG: NEGATIVE
HIV Screen 4th Generation wRfx: NONREACTIVE
Hematocrit: 31.2 % — ABNORMAL LOW (ref 34.0–46.6)
IMMATURE GRANS (ABS): 0 10*3/uL (ref 0.0–0.1)
IMMATURE GRANULOCYTES: 0 %
Lymphocytes Absolute: 2 10*3/uL (ref 0.7–3.1)
Lymphs: 18 %
MCH: 28.5 pg (ref 26.6–33.0)
MCHC: 33 g/dL (ref 31.5–35.7)
MCV: 86 fL (ref 79–97)
MONOCYTES: 6 %
Monocytes Absolute: 0.7 10*3/uL (ref 0.1–0.9)
NEUTROS PCT: 74 %
Neutrophils Absolute: 8.1 10*3/uL — ABNORMAL HIGH (ref 1.4–7.0)
Platelets: 219 10*3/uL (ref 150–379)
RBC: 3.62 x10E6/uL — ABNORMAL LOW (ref 3.77–5.28)
RDW: 19.9 % — ABNORMAL HIGH (ref 12.3–15.4)
RH TYPE: POSITIVE
RPR: NONREACTIVE
RUBELLA: 2.01 {index} (ref >0.99)
WBC: 11 10*3/uL — ABNORMAL HIGH (ref 3.4–10.8)

## 2017-09-05 LAB — HEMOGLOBINOPATHY EVALUATION
HGB C: 0 %
HGB S: 0 %
HGB VARIANT: 0 %
Hemoglobin A2 Quantitation: 2.4 % (ref 1.8–3.2)
Hemoglobin F Quantitation: 0 % (ref 0.0–2.0)
Hgb A: 97.6 % (ref 96.4–98.8)

## 2017-09-05 LAB — URINE CULTURE, OB REFLEX

## 2017-09-05 LAB — CULTURE, OB URINE

## 2017-09-06 LAB — CYTOLOGY - PAP
CHLAMYDIA, DNA PROBE: NEGATIVE
Candida vaginitis: POSITIVE — AB
HPV: DETECTED — AB
Neisseria Gonorrhea: NEGATIVE
Trichomonas: NEGATIVE

## 2017-09-10 ENCOUNTER — Other Ambulatory Visit: Payer: Self-pay | Admitting: *Deleted

## 2017-09-10 DIAGNOSIS — B3731 Acute candidiasis of vulva and vagina: Secondary | ICD-10-CM

## 2017-09-10 DIAGNOSIS — B373 Candidiasis of vulva and vagina: Secondary | ICD-10-CM

## 2017-09-10 MED ORDER — TERCONAZOLE 0.4 % VA CREA: 1.0000 | TOPICAL_CREAM | Freq: Every day | VAGINAL | 0 refills | Status: DC

## 2017-09-10 NOTE — Progress Notes (Signed)
Terazol sent to pharmacy per Dr Alysia PennaErvin order.

## 2017-09-13 LAB — CYSTIC FIBROSIS MUTATION 97: GENE DIS ANAL CARRIER INTERP BLD/T-IMP: NOT DETECTED

## 2017-09-17 ENCOUNTER — Encounter: Payer: Self-pay | Admitting: Obstetrics and Gynecology

## 2017-09-17 DIAGNOSIS — O99019 Anemia complicating pregnancy, unspecified trimester: Secondary | ICD-10-CM | POA: Insufficient documentation

## 2017-09-17 DIAGNOSIS — R87611 Atypical squamous cells cannot exclude high grade squamous intraepithelial lesion on cytologic smear of cervix (ASC-H): Secondary | ICD-10-CM | POA: Insufficient documentation

## 2017-10-01 ENCOUNTER — Encounter: Payer: Medicaid Other | Admitting: Obstetrics & Gynecology

## 2017-10-08 ENCOUNTER — Encounter: Payer: Self-pay | Admitting: Obstetrics and Gynecology

## 2017-10-08 ENCOUNTER — Ambulatory Visit (INDEPENDENT_AMBULATORY_CARE_PROVIDER_SITE_OTHER): Payer: Medicaid Other | Admitting: Obstetrics and Gynecology

## 2017-10-08 VITALS — BP 112/75 | HR 80 | Wt 190.9 lb

## 2017-10-08 DIAGNOSIS — Z98891 History of uterine scar from previous surgery: Secondary | ICD-10-CM

## 2017-10-08 DIAGNOSIS — Z3490 Encounter for supervision of normal pregnancy, unspecified, unspecified trimester: Secondary | ICD-10-CM

## 2017-10-08 DIAGNOSIS — O0990 Supervision of high risk pregnancy, unspecified, unspecified trimester: Secondary | ICD-10-CM | POA: Insufficient documentation

## 2017-10-08 DIAGNOSIS — R87611 Atypical squamous cells cannot exclude high grade squamous intraepithelial lesion on cytologic smear of cervix (ASC-H): Secondary | ICD-10-CM

## 2017-10-08 DIAGNOSIS — Z3482 Encounter for supervision of other normal pregnancy, second trimester: Secondary | ICD-10-CM

## 2017-10-08 NOTE — Progress Notes (Signed)
Patient reports fetal movement and previously reports having uterine cramping, denies contractions and bleeding. Pt states that she was unaware of terconazole cream sent to pharmacy, advised pt to pick up rx.

## 2017-10-08 NOTE — Progress Notes (Signed)
Patient ID: Jenna [name], female   DOB: May 20, 1996, 22 y.o.   MRN: [id number] Colposcopy Procedure Note  Indications: ASCUS cannot rule out HGSIL   Procedure Details  The risks and benefits of the procedure and Written informed consent obtained.  Speculum placed in vagina and excellent visualization of cervix achieved, cervix swabbed x 3 with acetic acid solution.  Findings: Cervix: no visible lesions, no mosaicism and no punctation; no BX d/t to pregnancy Vaginal inspection: vaginal colposcopy not performed. Vulvar colposcopy: vulvar colposcopy not performed.  Specimens: NA  Complications: none.  Plan: Repeat pap smear PP

## 2017-10-08 NOTE — Patient Instructions (Signed)

## 2017-10-08 NOTE — Progress Notes (Signed)
Subjective:  Jenna [name] is a 22 y.o. [id number] at 3828w6d being seen today for ongoing prenatal care.  She is currently monitored for the following issues for this low-risk pregnancy and has Tobacco smoking affecting pregnancy, antepartum; Social problem; History of pelvic fracture; MDD (major depressive disorder), recurrent severe, without psychosis (HCC); Post-partum depression; Encounter for supervision of normal pregnancy, unspecified, unspecified trimester; History of cesarean section; Late prenatal care; Atypical squamous cells cannot exclude high grade squamous intraepithelial lesion on cytologic smear of cervix (ASC-H); Anemia in pregnancy; and Supervision of high risk pregnancy, antepartum on their problem list.  Patient reports no complaints.  Contractions: Not present. Vag. Bleeding: None.  Movement: Present. Denies leaking of fluid.   The following portions of the patient's history were reviewed and updated as appropriate: allergies, current medications, past family history, past medical history, past social history, past surgical history and problem list. Problem list updated.  Objective:   Vitals:   10/08/17 1558  BP: 112/75  Pulse: 80  Weight: 190 lb 14.4 oz (86.6 kg)    Fetal Status: Fetal Heart Rate (bpm): 146   Movement: Present     General:  Alert, oriented and cooperative. Patient is in no acute distress.  Skin: Skin is warm and dry. No rash noted.   Cardiovascular: Normal heart rate noted  Respiratory: Normal respiratory effort, no problems with respiration noted  Abdomen: Soft, gravid, appropriate for gestational age. Pain/Pressure: Absent     Pelvic:  Cervical exam deferred        Extremities: Normal range of motion.  Edema: None  Mental Status: Normal mood and affect. Normal behavior. Normal judgment and thought content.   Urinalysis:      Assessment and Plan:  Pregnancy: G4P1021 at 128w6d  1. History of cesarean section Will need to discuss VBAC vs RLTCS at  next visit  2. Atypical squamous cells cannot exclude high grade squamous intraepithelial lesion on cytologic smear of cervix (ASC-H) Colpo today Will need pap smear PP  3. Supervision of high risk pregnancy, antepartum Stable Glucola next visit - US MFM OB COMP + 14 WK; Future    Preterm labor symptoms and general obstetric precautions including but not limited to vaginal bleeding, contractions, leaking of fluid and fetal movement were reviewed in detail with the patient. Please refer to After Visit Summary for other counseling recommendations.  Return in about 2 weeks (around 10/22/2017).   Hermina StaggersErvin, Jenna Yott L, MD

## 2017-10-09 ENCOUNTER — Other Ambulatory Visit: Payer: Self-pay

## 2017-10-09 ENCOUNTER — Encounter (HOSPITAL_COMMUNITY): Payer: Self-pay | Admitting: Pediatrics

## 2017-10-09 ENCOUNTER — Encounter: Payer: Self-pay | Admitting: *Deleted

## 2017-10-09 ENCOUNTER — Inpatient Hospital Stay (HOSPITAL_COMMUNITY)
Admission: EM | Admit: 2017-10-09 | Discharge: 2017-10-09 | Disposition: A | Discharge status: Home / Self Care | Payer: Medicaid Other | Attending: Family Medicine | Admitting: Family Medicine

## 2017-10-09 DIAGNOSIS — F17210 Nicotine dependence, cigarettes, uncomplicated: Secondary | ICD-10-CM | POA: Insufficient documentation

## 2017-10-09 DIAGNOSIS — W182XXA Fall in (into) shower or empty bathtub, initial encounter: Secondary | ICD-10-CM | POA: Diagnosis not present

## 2017-10-09 DIAGNOSIS — Y93E1 Activity, personal bathing and showering: Secondary | ICD-10-CM | POA: Insufficient documentation

## 2017-10-09 DIAGNOSIS — Y998 Other external cause status: Secondary | ICD-10-CM | POA: Diagnosis not present

## 2017-10-09 DIAGNOSIS — O9989 Other specified diseases and conditions complicating pregnancy, childbirth and the puerperium: Secondary | ICD-10-CM | POA: Diagnosis not present

## 2017-10-09 DIAGNOSIS — R1011 Right upper quadrant pain: Secondary | ICD-10-CM | POA: Insufficient documentation

## 2017-10-09 DIAGNOSIS — Y92002 Bathroom of unspecified non-institutional (private) residence single-family (private) house as the place of occurrence of the external cause: Secondary | ICD-10-CM | POA: Insufficient documentation

## 2017-10-09 DIAGNOSIS — Z79899 Other long term (current) drug therapy: Secondary | ICD-10-CM | POA: Insufficient documentation

## 2017-10-09 DIAGNOSIS — Z3A27 27 weeks gestation of pregnancy: Secondary | ICD-10-CM | POA: Insufficient documentation

## 2017-10-09 DIAGNOSIS — W19XXXA Unspecified fall, initial encounter: Secondary | ICD-10-CM

## 2017-10-09 MED ORDER — ONDANSETRON 4 MG PO TBDP
4.0000 mg | ORAL_TABLET | Freq: Once | ORAL | Status: AC
  Administered 2017-10-09: 4 mg via ORAL
  Filled 2017-10-09: qty 1
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
## 2017-10-09 NOTE — ED Notes (Signed)
[facility] in room.  

## 2017-10-09 NOTE — ED Triage Notes (Addendum)
Pt arrived via [facility] after falling in shower this morning. Pt was holding baby and they both fell. Pt hit her R arm and R hand but is not complaining of pain. No meds PTA. Pt is pregnant-states almost 6 months

## 2017-10-09 NOTE — ED Notes (Signed)
[facility] , report called to MAU by [facility] RN
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
## 2017-10-09 NOTE — Progress Notes (Signed)
Dr Elroy ChannelIrvin notified that pt presented today at 27w 0d after falling in shower and landing on buttocks.  Pt currently reports no vaginal bleeding or leaking of fluid but says she has mild constant pain on her right upper abdomen.  Dr Elroy ChannelIrvin says [facility] needs [facility] after being cleared at [facility].  [place] made aware and MAU notified.

## 2017-10-09 NOTE — Discharge Instructions (Signed)
Preventing Injuries During Pregnancy °Trauma is the most common cause of injury and death in pregnant women. This can also result in serious harm to the baby or even death. °How can injuries affect my pregnancy? °Your baby is protected in the womb (uterus) by a sac filled with fluid (amniotic sac). Your baby can be harmed if there is a direct blow to your abdomen and pelvis. Trauma may be caused by: °· Falls. These are more common in the second and third trimester of pregnancy. °· Automobile accidents. °· Domestic violence or assault. °· Severe burns, such as from fire or electricity. ° °These injuries can result in: °· Tearing of your uterus. °· The placenta pulling away from the wall of the uterus (placental abruption). °· The amniotic sac breaking open (rupture of membranes). °· Blockage or decrease in the blood supply to your baby. °· Going into labor earlier than expected. °· Severe injuries to other parts of your body, such as your brain, spine, heart, lungs, or other organs. ° °Minor falls and low-impact automobile accidents do not usually harm your baby, even if they cause a little harm to you. °What can I do to lower my risk? °Safety °· Remove slippery rugs and loose objects on the floor. They increase your risk of tripping or slipping. °· Wear comfortable shoes that have a good grip on the sole. Do not wear high-heeled shoes. °· Always wear your seat belt properly when riding in a car. Use both the lap and shoulder belt, with the lap belt below your abdomen. Always practice safe driving. Do not ride on a motorcycle while pregnant. °Activity °· Avoid walking on wet or slippery floors. °· Do not participate in rough and violent activities or sports. °· Avoid high-risk situations and activities such as: °? Lifting heavy pots of boiling or hot liquids. °? Fixing electrical problems. °? Being near fires or starting fires. °General instructions °· Take over-the-counter and prescription medicines only as told by  your health care provider. °· Know your blood type and the father's blood type in case you develop vaginal bleeding or experience an injury for which a blood transfusion is needed. °· Spousal abuse can be a serious cause of trauma during pregnancy. If you are a victim of domestic violence or assault: °? Call your local emergency services (911 in the [place] °? Contact the National Domestic Violence Hotline for help and support. °When should I seek immediate medical care? °Get help right away if: °· You fall on your abdomen or experience any serious blow to your abdomen. °· You develop stiffness in your neck or pain after a fall or from other trauma. °· You develop a headache or vision problems after a fall or from other trauma. °· You do not feel the baby moving after a fall or trauma, or you feel that the baby is not moving as much as before the fall or trauma. °· You have been the victim of domestic violence or any other kind of physical attack. °· You have been in a car accident. °· You develop vaginal bleeding. °· You have fluid leaking from the vagina. °· You develop uterine contractions. Symptoms include pelvic cramping, pain, or serious low back pain. °· You become weak, faint, or have uncontrolled vomiting after trauma. °· You have a serious burn. This includes burns to the face, neck, hands, or genitals, or burns greater than the size of your palm anywhere else. ° °Summary °· Trauma is the most common cause of   injury and death in pregnant women and can also lead to injury or death of the baby. °· Falls, automobile accidents, domestic violence or assault, and severe burns can injure you or your baby. Make sure to get medical help right away if you experience any of these during your pregnancy. °· Take steps to prevent slips or falls in your home, such as avoiding slippery floors and removing loose rugs. °· Always wear your seat belt properly when riding in a car. Practice safe driving. °This information is  not intended to replace advice given to you by your health care provider. Make sure you discuss any questions you have with your health care provider. °Document Released: 07/25/2004 Document Revised: 06/26/2016 Document Reviewed: 06/26/2016 °Elsevier Interactive Patient Education © 2017 [employer] Inc. ° °

## 2017-10-09 NOTE — MAU Note (Signed)
Pt reports she fell while in shower today, denies striking abdomen.  Denies VB or LOF.  Reports +FM.  Reports abdominal cramping.

## 2017-10-17 ENCOUNTER — Ambulatory Visit (HOSPITAL_COMMUNITY): Payer: Medicaid Other | Attending: Obstetrics and Gynecology

## 2017-10-22 ENCOUNTER — Other Ambulatory Visit: Payer: Self-pay

## 2017-10-22 ENCOUNTER — Inpatient Hospital Stay (HOSPITAL_COMMUNITY)
Admission: AD | Admit: 2017-10-22 | Discharge: 2017-10-22 | Disposition: A | Discharge status: Home / Self Care | Payer: Medicaid Other | Source: Ambulatory Visit | Attending: Obstetrics & Gynecology | Admitting: Obstetrics & Gynecology

## 2017-10-22 ENCOUNTER — Other Ambulatory Visit: Payer: Medicaid Other

## 2017-10-22 ENCOUNTER — Encounter: Payer: Medicaid Other | Admitting: Obstetrics & Gynecology

## 2017-10-22 ENCOUNTER — Encounter (HOSPITAL_COMMUNITY): Payer: Self-pay | Admitting: *Deleted

## 2017-10-22 DIAGNOSIS — O99333 Smoking (tobacco) complicating pregnancy, third trimester: Secondary | ICD-10-CM | POA: Diagnosis not present

## 2017-10-22 DIAGNOSIS — F17210 Nicotine dependence, cigarettes, uncomplicated: Secondary | ICD-10-CM | POA: Diagnosis not present

## 2017-10-22 DIAGNOSIS — Z6790 Unspecified blood type, Rh positive: Secondary | ICD-10-CM

## 2017-10-22 DIAGNOSIS — B9689 Other specified bacterial agents as the cause of diseases classified elsewhere: Secondary | ICD-10-CM

## 2017-10-22 DIAGNOSIS — O23593 Infection of other part of genital tract in pregnancy, third trimester: Secondary | ICD-10-CM | POA: Diagnosis not present

## 2017-10-22 DIAGNOSIS — Z9889 Other specified postprocedural states: Secondary | ICD-10-CM | POA: Diagnosis not present

## 2017-10-22 DIAGNOSIS — N760 Acute vaginitis: Secondary | ICD-10-CM | POA: Insufficient documentation

## 2017-10-22 DIAGNOSIS — Z8619 Personal history of other infectious and parasitic diseases: Secondary | ICD-10-CM | POA: Insufficient documentation

## 2017-10-22 DIAGNOSIS — O4693 Antepartum hemorrhage, unspecified, third trimester: Secondary | ICD-10-CM

## 2017-10-22 DIAGNOSIS — O4690 Antepartum hemorrhage, unspecified, unspecified trimester: Secondary | ICD-10-CM

## 2017-10-22 DIAGNOSIS — Z3A28 28 weeks gestation of pregnancy: Secondary | ICD-10-CM

## 2017-10-22 LAB — WET PREP, GENITAL: Trich, Wet Prep: NONE SEEN

## 2017-10-22 MED ORDER — METRONIDAZOLE 500 MG PO TABS: 500.0000 mg | ORAL_TABLET | Freq: Two times a day (BID) | ORAL | 0 refills | Status: DC
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
## 2017-10-22 NOTE — Discharge Instructions (Signed)
Vaginal Bleeding During Pregnancy, Third Trimester °A small amount of bleeding (spotting) from the vagina is common in pregnancy. Sometimes the bleeding is normal and is not a problem, and sometimes it is a sign of something serious. Be sure to tell your doctor about any bleeding from your vagina right away. °Follow these instructions at home: °· Watch your condition for any changes. °· Follow your doctor's instructions about how active you can be. °· If you are on bed rest: °? You may need to stay in bed and only get up to use the bathroom. °? You may be allowed to do some activities. °? If you need help, make plans for someone to help you. °· Write down: °? The number of pads you use each day. °? How often you change pads. °? How soaked (saturated) your pads are. °· Do not use tampons. °· Do not douche. °· Do not have sex or orgasms until your doctor says it is okay. °· Follow your doctor's advice about lifting, driving, and doing physical activities. °· If you pass any tissue from your vagina, save the tissue so you can show it to your doctor. °· Only take medicines as told by your doctor. °· Do not take aspirin because it can make you bleed. °· Keep all follow-up visits as told by your doctor. °Contact a doctor if: °· You bleed from your vagina. °· You have cramps. °· You have labor pains. °· You have a fever that does not go away after you take medicine. °Get help right away if: °· You have very bad cramps in your back or belly (abdomen). °· You have chills. °· You have a gush of fluid from your vagina. °· You pass large clots or tissue from your vagina. °· You bleed more. °· You feel light-headed or weak. °· You pass out (faint). °· You do not feel your baby move around as much as before. °This information is not intended to replace advice given to you by your health care provider. Make sure you discuss any questions you have with your health care provider. °Document Released: 11/01/2013 Document Revised:  11/23/2015 Document Reviewed: 02/22/2013 °Elsevier Interactive Patient Education © 2018 [employer] ° °

## 2017-10-22 NOTE — MAU Note (Signed)
Urine in lab 

## 2017-10-22 NOTE — MAU Note (Signed)
Was having sex, saw blood on the bed, some severe cramping now-  Saw blood in the toilet. so went home and called the ambulance.

## 2017-10-23 ENCOUNTER — Encounter (INDEPENDENT_AMBULATORY_CARE_PROVIDER_SITE_OTHER): Payer: Self-pay

## 2017-10-23 LAB — GC/CHLAMYDIA PROBE AMP (~~LOC~~) NOT AT ARMC
CHLAMYDIA, DNA PROBE: NEGATIVE
Neisseria Gonorrhea: NEGATIVE

## 2017-11-03 ENCOUNTER — Encounter: Payer: Self-pay | Admitting: Obstetrics and Gynecology

## 2017-11-06 ENCOUNTER — Encounter: Payer: Self-pay | Admitting: Obstetrics and Gynecology

## 2017-11-06 ENCOUNTER — Ambulatory Visit (INDEPENDENT_AMBULATORY_CARE_PROVIDER_SITE_OTHER): Payer: Medicaid Other | Admitting: Obstetrics and Gynecology

## 2017-11-06 ENCOUNTER — Encounter: Payer: Self-pay | Admitting: *Deleted

## 2017-11-06 VITALS — BP 121/75 | HR 67 | Wt 193.7 lb

## 2017-11-06 DIAGNOSIS — O0930 Supervision of pregnancy with insufficient antenatal care, unspecified trimester: Secondary | ICD-10-CM

## 2017-11-06 DIAGNOSIS — O0933 Supervision of pregnancy with insufficient antenatal care, third trimester: Secondary | ICD-10-CM

## 2017-11-06 DIAGNOSIS — Z3480 Encounter for supervision of other normal pregnancy, unspecified trimester: Secondary | ICD-10-CM

## 2017-11-06 DIAGNOSIS — R87611 Atypical squamous cells cannot exclude high grade squamous intraepithelial lesion on cytologic smear of cervix (ASC-H): Secondary | ICD-10-CM

## 2017-11-06 DIAGNOSIS — Z8781 Personal history of (healed) traumatic fracture: Secondary | ICD-10-CM

## 2017-11-06 DIAGNOSIS — Z3009 Encounter for other general counseling and advice on contraception: Secondary | ICD-10-CM

## 2017-11-06 DIAGNOSIS — Z98891 History of uterine scar from previous surgery: Secondary | ICD-10-CM

## 2017-11-06 LAB — GLUCOSE, POCT (MANUAL RESULT ENTRY): POC Glucose: 80 mg/dl (ref 70–99)

## 2017-11-06 NOTE — Progress Notes (Signed)
PRENATAL VISIT NOTE  Subjective:  Jenna [name] is a 22 y.o. [id number] at [redacted]w[redacted]d being seen today for ongoing prenatal care.  She is currently monitored for the following issues for this high-risk pregnancy and has Tobacco smoking affecting pregnancy, antepartum; Social problem; History of pelvic fracture; MDD (major depressive disorder), recurrent severe, without psychosis (HCC); Post-partum depression; Encounter for supervision of normal pregnancy, unspecified, unspecified trimester; History of cesarean section; Late prenatal care; Atypical squamous cells cannot exclude high grade squamous intraepithelial lesion on cytologic smear of cervix (ASC-H); Anemia in pregnancy; and Supervision of high risk pregnancy, antepartum on their problem list.  Patient reports no complaints.  Contractions: Not present. Vag. Bleeding: None.  Movement: Present. Denies leaking of fluid.   The following portions of the patient's history were reviewed and updated as appropriate: allergies, current medications, past family history, past medical history, past social history, past surgical history and problem list. Problem list updated.  Objective:   Vitals:   11/06/17 1613  BP: 121/75  Pulse: 67  Weight: 193 lb 11.2 oz (87.9 kg)    Fetal Status: Fetal Heart Rate (bpm): 152   Movement: Present     General:  Alert, oriented and cooperative. Patient is in no acute distress.  Skin: Skin is warm and dry. No rash noted.   Cardiovascular: Normal heart rate noted  Respiratory: Normal respiratory effort, no problems with respiration noted  Abdomen: Soft, gravid, appropriate for gestational age.  Pain/Pressure: Absent     Pelvic: Cervical exam deferred        Extremities: Normal range of motion.  Edema: None  Mental Status: Normal mood and affect. Normal behavior. Normal judgment and thought content.   Assessment and Plan:  Pregnancy: G4P1021 at [redacted]w[redacted]d  1. Supervision of other normal pregnancy, antepartum Anatomy  scheduled today - POCT Glucose (CBG)  2. History of cesarean section Reviewed risks/benefits of TOLAC versus RCS in detail. Patient counseled regarding potential vaginal delivery, chance of success, future implications, possible uterine rupture and need for urgent/emergent repeat cesarean. Counseled regarding potential need for repeat c-section for reasons unrelated to first c-section. Counseled regarding scheduled repeat cesarean including risks of bleeding, infection, damage to surrounding tissue, abnormal placentation, implications for future pregnancies. All questions answered.  Patient desires TOLAC, consent signed 11/06/2017.  3. Atypical squamous cells cannot exclude high grade squamous intraepithelial lesion on cytologic smear of cervix (ASC-H) S/p colpo  4. Late prenatal care  5. History of pelvic fracture  6. Unwanted fertility Patient requests BTL to be done at time of delivery. I reviewed with her that this is a permanent procedure and cannot be undone. I reviewed that she will not be able to have children after it is done. Reviewed risks of bilateral tubal ligation including infection, hemorrhage, damage to surrounding tissue and organs, risk of regret. I reviewed that the risk of regret is higher with younger women and she verbalizes understanding of the above. I reviewed that it is not 100% effective. She understands this is an elective procedure and again affirms her desire. Medicaid BTL papers signed.  I reviewed that as there are multiple physicians in practice, I cannot guarantee that all partners will agree to do BTL on patient given she is 22 years old. Will review with group and discuss next visit. She would like nexplanon if unable to have BTL done with delivery.    Preterm labor symptoms and general obstetric precautions including but not limited to vaginal bleeding, contractions, leaking of fluid and  fetal movement were reviewed in detail with the patient. Please refer to  After Visit Summary for other counseling recommendations.  Return in about 2 weeks (around 11/20/2017) for OB visit (MD).  Future Appointments  Date Time Provider Department Center  11/10/2017  8:45 AM [facility] None  11/17/2017  1:30 PM [facility] [place] 1 [facility]  11/20/2017  2:45 PM Hermina Staggers, MD [facility] None    Conan Bowens, MD

## 2017-11-10 ENCOUNTER — Other Ambulatory Visit: Payer: Self-pay

## 2017-11-17 ENCOUNTER — Emergency Department (HOSPITAL_COMMUNITY)
Admission: EM | Admit: 2017-11-17 | Discharge: 2017-11-17 | Disposition: A | Discharge status: Home / Self Care | Payer: Medicaid Other | Attending: Emergency Medicine | Admitting: Emergency Medicine

## 2017-11-17 ENCOUNTER — Encounter (HOSPITAL_COMMUNITY): Payer: Self-pay | Admitting: Emergency Medicine

## 2017-11-17 ENCOUNTER — Ambulatory Visit (HOSPITAL_COMMUNITY)
Admission: RE | Admit: 2017-11-17 | Discharge: 2017-11-17 | Disposition: A | Discharge status: Home / Self Care | Payer: Medicaid Other | Source: Ambulatory Visit | Attending: Obstetrics and Gynecology | Admitting: Obstetrics and Gynecology

## 2017-11-17 ENCOUNTER — Other Ambulatory Visit: Payer: Self-pay | Admitting: Obstetrics and Gynecology

## 2017-11-17 DIAGNOSIS — Z3480 Encounter for supervision of other normal pregnancy, unspecified trimester: Secondary | ICD-10-CM

## 2017-11-17 DIAGNOSIS — O34219 Maternal care for unspecified type scar from previous cesarean delivery: Secondary | ICD-10-CM | POA: Diagnosis not present

## 2017-11-17 DIAGNOSIS — O99333 Smoking (tobacco) complicating pregnancy, third trimester: Secondary | ICD-10-CM | POA: Diagnosis not present

## 2017-11-17 DIAGNOSIS — Z79899 Other long term (current) drug therapy: Secondary | ICD-10-CM | POA: Insufficient documentation

## 2017-11-17 DIAGNOSIS — Z3A00 Weeks of gestation of pregnancy not specified: Secondary | ICD-10-CM | POA: Insufficient documentation

## 2017-11-17 DIAGNOSIS — O0990 Supervision of high risk pregnancy, unspecified, unspecified trimester: Secondary | ICD-10-CM

## 2017-11-17 DIAGNOSIS — Z363 Encounter for antenatal screening for malformations: Secondary | ICD-10-CM | POA: Insufficient documentation

## 2017-11-17 DIAGNOSIS — O09893 Supervision of other high risk pregnancies, third trimester: Secondary | ICD-10-CM | POA: Insufficient documentation

## 2017-11-17 DIAGNOSIS — L0201 Cutaneous abscess of face: Secondary | ICD-10-CM | POA: Diagnosis not present

## 2017-11-17 DIAGNOSIS — L0291 Cutaneous abscess, unspecified: Secondary | ICD-10-CM

## 2017-11-17 DIAGNOSIS — Z3A32 32 weeks gestation of pregnancy: Secondary | ICD-10-CM

## 2017-11-17 DIAGNOSIS — O9989 Other specified diseases and conditions complicating pregnancy, childbirth and the puerperium: Secondary | ICD-10-CM | POA: Insufficient documentation

## 2017-11-17 DIAGNOSIS — Z87891 Personal history of nicotine dependence: Secondary | ICD-10-CM | POA: Insufficient documentation

## 2017-11-17 DIAGNOSIS — O0933 Supervision of pregnancy with insufficient antenatal care, third trimester: Secondary | ICD-10-CM | POA: Insufficient documentation

## 2017-11-17 MED ORDER — MUPIROCIN 2 % EX OINT: TOPICAL_OINTMENT | CUTANEOUS | 0 refills | Status: DC

## 2017-11-17 MED ORDER — LIDOCAINE HCL 2 % IJ SOLN
5.0000 mL | Freq: Once | INTRAMUSCULAR | Status: DC
  Filled 2017-11-17: qty 20

## 2017-11-17 MED ORDER — BACITRACIN ZINC 500 UNIT/GM EX OINT
TOPICAL_OINTMENT | Freq: Once | CUTANEOUS | Status: AC
  Administered 2017-11-17: 20:00:00 via TOPICAL

## 2017-11-17 NOTE — Discharge Instructions (Signed)
Apply topical antibiotic ointment to the area twice daily.   Tylenol as needed for pain.  Return to the emergency department if you develop a fever, your abscess appears to become infected (growing surrounding redness and warmth), new or worsening symptoms develop, any additional concerns.   Abscess An abscess (boil or furuncle) is an infected area that contains a collection of pus.   SYMPTOMS Signs and symptoms of an abscess include pain, tenderness, redness, or hardness. You may feel a moveable soft area under your skin. An abscess can occur anywhere in the body.   TREATMENT  A surgical cut (incision) may be made over your abscess to drain the pus. Gauze may be packed into the space or a drain may be looped through the abscess cavity (pocket). This provides a drain that will allow the cavity to heal from the inside outwards. The abscess may be painful for a few days, but should feel much better if it was drained.  Your abscess, if seen early, may not have localized and may not have been drained. If not, another appointment may be required if it does not get better on its own or with medications.  HOME CARE INSTRUCTIONS  Keep the skin and clothes clean around your abscess.  If the abscess was drained, you will need to use gauze dressing to collect any draining pus. Dressings will typically need to be changed 3 or more times a day.  The infection may spread by skin contact with others. Avoid skin contact as much as possible.  Practice good hygiene. This includes regular hand washing, cover any draining skin lesions, and do not share personal care items.  SEEK MEDICAL CARE IF:  You develop increased pain, swelling, redness, drainage, or bleeding in the wound site.  You develop signs of generalized infection including muscle aches, chills, fever, or a general ill feeling.  You have an oral temperature above 102 F (38.9 C).  MAKE SURE YOU:  Understand these instructions.  Will watch your  condition.  Will get help right away if you are not doing well or get worse.  Document Released: 03/27/2005 Document Revised: 02/27/2011 Document Reviewed: 01/19/2008 [facility] Patient Information 2012 [place], [place].

## 2017-11-17 NOTE — ED Notes (Signed)
Pt verbalized understanding discharge instructions and denies any further needs or questions at this time. VS stable, ambulatory and steady gait.   
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
## 2017-11-17 NOTE — ED Triage Notes (Signed)
Pt reports a boil on her forehead. Pt reports drainage for 4 days.

## 2017-11-20 ENCOUNTER — Encounter: Payer: Self-pay | Admitting: Obstetrics and Gynecology

## 2017-12-02 ENCOUNTER — Telehealth: Payer: Self-pay | Admitting: *Deleted

## 2017-12-02 NOTE — Telephone Encounter (Signed)
Lft vmail fot patient to callback and reschedule missed OB appt on 11/20/2017.Marland Kitchen..Marland Kitchen

## 2017-12-15 ENCOUNTER — Encounter (HOSPITAL_COMMUNITY): Payer: Self-pay

## 2017-12-15 ENCOUNTER — Inpatient Hospital Stay (HOSPITAL_COMMUNITY)
Admission: AD | Admit: 2017-12-15 | Discharge: 2017-12-15 | Disposition: A | Discharge status: Home / Self Care | Payer: Medicaid Other | Source: Ambulatory Visit | Attending: Obstetrics & Gynecology | Admitting: Obstetrics & Gynecology

## 2017-12-15 DIAGNOSIS — Z3A36 36 weeks gestation of pregnancy: Secondary | ICD-10-CM | POA: Diagnosis not present

## 2017-12-15 DIAGNOSIS — Z79899 Other long term (current) drug therapy: Secondary | ICD-10-CM | POA: Insufficient documentation

## 2017-12-15 DIAGNOSIS — O26893 Other specified pregnancy related conditions, third trimester: Secondary | ICD-10-CM | POA: Diagnosis present

## 2017-12-15 DIAGNOSIS — M549 Dorsalgia, unspecified: Secondary | ICD-10-CM | POA: Diagnosis present

## 2017-12-15 DIAGNOSIS — O99343 Other mental disorders complicating pregnancy, third trimester: Secondary | ICD-10-CM | POA: Insufficient documentation

## 2017-12-15 DIAGNOSIS — F329 Major depressive disorder, single episode, unspecified: Secondary | ICD-10-CM | POA: Insufficient documentation

## 2017-12-15 DIAGNOSIS — R109 Unspecified abdominal pain: Secondary | ICD-10-CM | POA: Diagnosis present

## 2017-12-15 DIAGNOSIS — Z87891 Personal history of nicotine dependence: Secondary | ICD-10-CM | POA: Insufficient documentation

## 2017-12-15 DIAGNOSIS — F419 Anxiety disorder, unspecified: Secondary | ICD-10-CM | POA: Diagnosis not present

## 2017-12-15 DIAGNOSIS — R1030 Lower abdominal pain, unspecified: Secondary | ICD-10-CM | POA: Insufficient documentation

## 2017-12-15 DIAGNOSIS — O9989 Other specified diseases and conditions complicating pregnancy, childbirth and the puerperium: Secondary | ICD-10-CM | POA: Diagnosis not present

## 2017-12-15 DIAGNOSIS — Z3689 Encounter for other specified antenatal screening: Secondary | ICD-10-CM | POA: Diagnosis not present

## 2017-12-15 LAB — URINALYSIS, ROUTINE W REFLEX MICROSCOPIC
BILIRUBIN URINE: NEGATIVE
GLUCOSE, UA: NEGATIVE mg/dL
HGB URINE DIPSTICK: NEGATIVE
Ketones, ur: NEGATIVE mg/dL
Nitrite: NEGATIVE
PH: 6 (ref 5.0–8.0)
PROTEIN: NEGATIVE mg/dL
Specific Gravity, Urine: 1.02 (ref 1.005–1.030)

## 2017-12-15 MED ORDER — CYCLOBENZAPRINE HCL 10 MG PO TABS
10.0000 mg | ORAL_TABLET | Freq: Three times a day (TID) | ORAL | Status: DC | PRN
  Administered 2017-12-15: 10 mg via ORAL
  Filled 2017-12-15: qty 1

## 2017-12-15 MED ORDER — CYCLOBENZAPRINE HCL 10 MG PO TABS: 10.0000 mg | ORAL_TABLET | Freq: Three times a day (TID) | ORAL | 0 refills | Status: DC | PRN

## 2017-12-15 NOTE — Discharge Instructions (Signed)
Back Pain in Pregnancy °Back pain during pregnancy is common. Back pain may be caused by several factors that are related to changes during your pregnancy. °Follow these instructions at home: °Managing pain, stiffness, and swelling °· If directed, apply ice for sudden (acute) back pain. °? Put ice in a plastic bag. °? Place a towel between your skin and the bag. °? Leave the ice on for 20 minutes, 2-3 times per day. °· If directed, apply heat to the affected area before you exercise: °? Place a towel between your skin and the heat pack or heating pad. °? Leave the heat on for 20-30 minutes. °? Remove the heat if your skin turns bright red. This is especially important if you are unable to feel pain, heat, or cold. You may have a greater risk of getting burned. °Activity °· Exercise as told by your health care provider. Exercising is the best way to prevent or manage back pain. °· Listen to your body when lifting. If lifting hurts, ask for help or bend your knees. This uses your leg muscles instead of your back muscles. °· Squat down when picking up something from the floor. Do not bend over. °· Only use bed rest as told by your health care provider. Bed rest should only be used for the most severe episodes of back pain. °Standing, Sitting, and Lying Down °· Do not stand in one place for long periods of time. °· Use good posture when sitting. Make sure your head rests over your shoulders and is not hanging forward. Use a pillow on your lower back if necessary. °· Try sleeping on your side, preferably the left side, with a pillow or two between your legs. If you are sore after a night's rest, your bed may be too soft. A firm mattress may provide more support for your back during pregnancy. °General instructions °· Do not wear high heels. °· Eat a healthy diet. Try to gain weight within your health care provider's recommendations. °· Use a maternity girdle, elastic sling, or back brace as told by your health care  provider. °· Take over-the-counter and prescription medicines only as told by your health care provider. °· Keep all follow-up visits as told by your health care provider. This is important. This includes any visits with any specialists, such as a physical therapist. °Contact a health care provider if: °· Your back pain interferes with your daily activities. °· You have increasing pain in other parts of your body. °Get help right away if: °· You develop numbness, tingling, weakness, or problems with the use of your arms or legs. °· You develop severe back pain that is not controlled with medicine. °· You have a sudden change in bowel or bladder control. °· You develop shortness of breath, dizziness, or you faint. °· You develop nausea, vomiting, or sweating. °· You have back pain that is a rhythmic, cramping pain similar to labor pains. Labor pain is usually 1-2 minutes apart, lasts for about 1 minute, and involves a bearing down feeling or pressure in your pelvis. °· You have back pain and your water breaks or you have vaginal bleeding. °· You have back pain or numbness that travels down your leg. °· Your back pain developed after you fell. °· You develop pain on one side of your back. °· You see blood in your urine. °· You develop skin blisters in the area of your back pain. °This information is not intended to replace advice given to you   by your health care provider. Make sure you discuss any questions you have with your health care provider. °Document Released: 09/25/2005 Document Revised: 11/23/2015 Document Reviewed: 03/01/2015 °Elsevier Interactive Patient Education © 2018 [employer] °Braxton Hicks Contractions °Contractions of the uterus can occur throughout pregnancy, but they are not always a sign that you are in labor. You may have practice contractions called Braxton Hicks contractions. These false labor contractions are sometimes confused with true labor. °What are Braxton Hicks  contractions? °Braxton Hicks contractions are tightening movements that occur in the muscles of the uterus before labor. Unlike true labor contractions, these contractions do not result in opening (dilation) and thinning of the cervix. Toward the end of pregnancy (32-34 weeks), Braxton Hicks contractions can happen more often and may become stronger. These contractions are sometimes difficult to tell apart from true labor because they can be very uncomfortable. You should not feel embarrassed if you go to the hospital with false labor. °Sometimes, the only way to tell if you are in true labor is for your health care provider to look for changes in the cervix. The health care provider will do a physical exam and may monitor your contractions. If you are not in true labor, the exam should show that your cervix is not dilating and your water has not broken. °If there are other health problems associated with your pregnancy, it is completely safe for you to be sent home with false labor. You may continue to have Braxton Hicks contractions until you go into true labor. °How to tell the difference between true labor and false labor °True labor °· Contractions last 30-70 seconds. °· Contractions become very regular. °· Discomfort is usually felt in the top of the uterus, and it spreads to the lower abdomen and low back. °· Contractions do not go away with walking. °· Contractions usually become more intense and increase in frequency. °· The cervix dilates and gets thinner. °False labor °· Contractions are usually shorter and not as strong as true labor contractions. °· Contractions are usually irregular. °· Contractions are often felt in the front of the lower abdomen and in the groin. °· Contractions may go away when you walk around or change positions while lying down. °· Contractions get weaker and are shorter-lasting as time goes on. °· The cervix usually does not dilate or become thin. °Follow these instructions at  home: °· Take over-the-counter and prescription medicines only as told by your health care provider. °· Keep up with your usual exercises and follow other instructions from your health care provider. °· Eat and drink lightly if you think you are going into labor. °· If Braxton Hicks contractions are making you uncomfortable: °? Change your position from lying down or resting to walking, or change from walking to resting. °? Sit and rest in a tub of warm water. °? Drink enough fluid to keep your urine pale yellow. Dehydration may cause these contractions. °? Do slow and deep breathing several times an hour. °· Keep all follow-up prenatal visits as told by your health care provider. This is important. °Contact a health care provider if: °· You have a fever. °· You have continuous pain in your abdomen. °Get help right away if: °· Your contractions become stronger, more regular, and closer together. °· You have fluid leaking or gushing from your vagina. °· You pass blood-tinged mucus (bloody show). °· You have bleeding from your vagina. °· You have low back pain that you never had before. °·   You feel your baby’s head pushing down and causing pelvic pressure. °· Your baby is not moving inside you as much as it used to. °Summary °· Contractions that occur before labor are called Braxton Hicks contractions, false labor, or practice contractions. °· Braxton Hicks contractions are usually shorter, weaker, farther apart, and less regular than true labor contractions. True labor contractions usually become progressively stronger and regular and they become more frequent. °· Manage discomfort from Braxton Hicks contractions by changing position, resting in a warm bath, drinking plenty of water, or practicing deep breathing. °This information is not intended to replace advice given to you by your health care provider. Make sure you discuss any questions you have with your health care provider. °Document Released: 10/31/2016  Document Revised: 10/31/2016 Document Reviewed: 10/31/2016 °Elsevier Interactive Patient Education © 2018 [employer] ° °

## 2017-12-15 NOTE — MAU Note (Signed)
Pt complains of lower abd pain and some vaginal pain.
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
## 2017-12-22 ENCOUNTER — Telehealth: Payer: Self-pay

## 2017-12-22 DIAGNOSIS — Z3482 Encounter for supervision of other normal pregnancy, second trimester: Secondary | ICD-10-CM

## 2017-12-22 NOTE — Telephone Encounter (Signed)
S/w patient and answered questions about FMLA process.

## 2017-12-26 ENCOUNTER — Other Ambulatory Visit: Payer: Self-pay | Admitting: Advanced Practice Midwife

## 2017-12-26 ENCOUNTER — Encounter: Payer: Medicaid Other | Admitting: Advanced Practice Midwife

## 2017-12-26 DIAGNOSIS — Z3482 Encounter for supervision of other normal pregnancy, second trimester: Secondary | ICD-10-CM

## 2017-12-26 NOTE — Progress Notes (Signed)
Patient missed LOB appointment this morning and has not been seen for routine OB care since 11/06/2017. Messaged clinic to contact patient to reschedule OB.  Incomplete anatomy screening 11/17/17. Radiology recommended follow up US within four weeks. Order placed for follow up to complete fetal anatomy assessment and growth trajectory.  Clayton BiblesSamantha Weinhold, CNM 12/26/17 8:51 AM

## 2017-12-29 ENCOUNTER — Encounter (HOSPITAL_COMMUNITY): Payer: Self-pay

## 2017-12-29 ENCOUNTER — Inpatient Hospital Stay (HOSPITAL_COMMUNITY)
Admission: AD | Admit: 2017-12-29 | Discharge: 2017-12-29 | Disposition: A | Discharge status: Home / Self Care | Payer: Medicaid Other | Source: Ambulatory Visit | Attending: Obstetrics and Gynecology | Admitting: Obstetrics and Gynecology

## 2017-12-29 DIAGNOSIS — G4700 Insomnia, unspecified: Secondary | ICD-10-CM | POA: Insufficient documentation

## 2017-12-29 DIAGNOSIS — Z3A38 38 weeks gestation of pregnancy: Secondary | ICD-10-CM | POA: Diagnosis not present

## 2017-12-29 DIAGNOSIS — O4292 Full-term premature rupture of membranes, unspecified as to length of time between rupture and onset of labor: Secondary | ICD-10-CM | POA: Diagnosis present

## 2017-12-29 DIAGNOSIS — O479 False labor, unspecified: Secondary | ICD-10-CM

## 2017-12-29 DIAGNOSIS — Z0371 Encounter for suspected problem with amniotic cavity and membrane ruled out: Secondary | ICD-10-CM

## 2017-12-29 DIAGNOSIS — O471 False labor at or after 37 completed weeks of gestation: Secondary | ICD-10-CM | POA: Diagnosis not present

## 2017-12-29 DIAGNOSIS — O99353 Diseases of the nervous system complicating pregnancy, third trimester: Secondary | ICD-10-CM | POA: Diagnosis not present

## 2017-12-29 LAB — URINALYSIS, ROUTINE W REFLEX MICROSCOPIC
Bilirubin Urine: NEGATIVE
Glucose, UA: NEGATIVE mg/dL
HGB URINE DIPSTICK: NEGATIVE
Ketones, ur: NEGATIVE mg/dL
Nitrite: NEGATIVE
PH: 7 (ref 5.0–8.0)
Protein, ur: NEGATIVE mg/dL
SPECIFIC GRAVITY, URINE: 1.017 (ref 1.005–1.030)
WBC, UA: >50 WBC/hpf — ABNORMAL HIGH (ref 0–5)

## 2017-12-29 LAB — AMNISURE RUPTURE OF MEMBRANE (ROM) NOT AT ARMC: Amnisure ROM: NEGATIVE

## 2017-12-29 LAB — POCT FERN TEST: POCT FERN TEST: NEGATIVE

## 2017-12-29 MED ORDER — ZOLPIDEM TARTRATE 5 MG PO TABS: 5.0000 mg | ORAL_TABLET | Freq: Every evening | ORAL | 0 refills | Status: DC | PRN

## 2017-12-29 NOTE — MAU Provider Note (Signed)
First Provider Initiated Contact with Patient 12/29/17 1338      S: Ms. Jenna [name] is a 22 y.o. [id number] at 2153w4d  who presents to MAU today complaining of leaking of fluid since 1230 today.  She denies vaginal bleeding. She endorses contractions. She reports normal fetal movement.    O: BP 130/84 (BP Location: Left Arm)   Pulse 65   Temp 97.8 F (36.6 C) (Oral)   Resp 16   LMP 03/28/2017 (Exact Date)  GENERAL: Well-developed, well-nourished female in no acute distress.  HEAD: Normocephalic, atraumatic.  CHEST: Normal effort of breathing, regular heart rate ABDOMEN: Soft, nontender, gravid   Cervical exam: 1 cm, thick, posterior   Fetal Monitoring: Baseline: 120 bpm Variability: Moderate  Accelerations: 15x15 Decelerations: None Contractions: Quiet   Results for orders placed or performed during the hospital encounter of 12/29/17 (from the past 24 hour(s))  Urinalysis, Routine w reflex microscopic     Status: Abnormal   Collection Time: 12/29/17  1:47 PM  Result Value Ref Range   Color, Urine YELLOW YELLOW   APPearance CLOUDY (A) CLEAR   Specific Gravity, Urine 1.017 1.005 - 1.030   pH 7.0 5.0 - 8.0   Glucose, UA NEGATIVE NEGATIVE mg/dL   Hgb urine dipstick NEGATIVE NEGATIVE   Bilirubin Urine NEGATIVE NEGATIVE   Ketones, ur NEGATIVE NEGATIVE mg/dL   Protein, ur NEGATIVE NEGATIVE mg/dL   Nitrite NEGATIVE NEGATIVE   Leukocytes, UA LARGE (A) NEGATIVE   RBC / HPF 6-10 0 - 5 RBC/hpf   WBC, UA >50 (H) 0 - 5 WBC/hpf   Bacteria, UA MANY (A) NONE SEEN   Squamous Epithelial / LPF 11-20 0 - 5   Mucus PRESENT   [facility] (rom[facility]     Status: None   Collection Time: 12/29/17  1:56 PM  Result Value Ref Range   Amnisure ROM NEGATIVE   POCT fern test     Status: None   Collection Time: 12/29/17  2:46 PM  Result Value Ref Range   POCT Fern Test Negative = intact amniotic membranes      A: SIUP at 6753w4d  Membranes intact  Insomnia    P:  Discharge home in stable condition Rx: Ambien, do not drive while taking  Return to MAU if symptoms worsen Labor precautions   Duane Lopeasch, Jennifer I, NP 12/29/2017 8:33 PM

## 2017-12-29 NOTE — MAU Note (Signed)
Got sample of fluid with swab and Crist FatFern was negative.

## 2017-12-29 NOTE — MAU Note (Addendum)
Pt states she started had a sharp shooting pain on her upper abdomen and started having ctx at noon. Arrived EMS at 1238.  Also states she's having some leaking of fluid.

## 2017-12-29 NOTE — Discharge Instructions (Signed)
Braxton Hicks Contractions °Contractions of the uterus can occur throughout pregnancy, but they are not always a sign that you are in labor. You may have practice contractions called Braxton Hicks contractions. These false labor contractions are sometimes confused with true labor. °What are Braxton Hicks contractions? °Braxton Hicks contractions are tightening movements that occur in the muscles of the uterus before labor. Unlike true labor contractions, these contractions do not result in opening (dilation) and thinning of the cervix. Toward the end of pregnancy (32-34 weeks), Braxton Hicks contractions can happen more often and may become stronger. These contractions are sometimes difficult to tell apart from true labor because they can be very uncomfortable. You should not feel embarrassed if you go to the hospital with false labor. °Sometimes, the only way to tell if you are in true labor is for your health care provider to look for changes in the cervix. The health care provider will do a physical exam and may monitor your contractions. If you are not in true labor, the exam should show that your cervix is not dilating and your water has not broken. °If there are other health problems associated with your pregnancy, it is completely safe for you to be sent home with false labor. You may continue to have Braxton Hicks contractions until you go into true labor. °How to tell the difference between true labor and false labor °True labor °· Contractions last 30-70 seconds. °· Contractions become very regular. °· Discomfort is usually felt in the top of the uterus, and it spreads to the lower abdomen and low back. °· Contractions do not go away with walking. °· Contractions usually become more intense and increase in frequency. °· The cervix dilates and gets thinner. °False labor °· Contractions are usually shorter and not as strong as true labor contractions. °· Contractions are usually irregular. °· Contractions  are often felt in the front of the lower abdomen and in the groin. °· Contractions may go away when you walk around or change positions while lying down. °· Contractions get weaker and are shorter-lasting as time goes on. °· The cervix usually does not dilate or become thin. °Follow these instructions at home: °· Take over-the-counter and prescription medicines only as told by your health care provider. °· Keep up with your usual exercises and follow other instructions from your health care provider. °· Eat and drink lightly if you think you are going into labor. °· If Braxton Hicks contractions are making you uncomfortable: °? Change your position from lying down or resting to walking, or change from walking to resting. °? Sit and rest in a tub of warm water. °? Drink enough fluid to keep your urine pale yellow. Dehydration may cause these contractions. °? Do slow and deep breathing several times an hour. °· Keep all follow-up prenatal visits as told by your health care provider. This is important. °Contact a health care provider if: °· You have a fever. °· You have continuous pain in your abdomen. °Get help right away if: °· Your contractions become stronger, more regular, and closer together. °· You have fluid leaking or gushing from your vagina. °· You pass blood-tinged mucus (bloody show). °· You have bleeding from your vagina. °· You have low back pain that you never had before. °· You feel your baby’s head pushing down and causing pelvic pressure. °· Your baby is not moving inside you as much as it used to. °Summary °· Contractions that occur before labor are called Braxton   Hicks contractions, false labor, or practice contractions. °· Braxton Hicks contractions are usually shorter, weaker, farther apart, and less regular than true labor contractions. True labor contractions usually become progressively stronger and regular and they become more frequent. °· Manage discomfort from Braxton Hicks contractions by  changing position, resting in a warm bath, drinking plenty of water, or practicing deep breathing. °This information is not intended to replace advice given to you by your health care provider. Make sure you discuss any questions you have with your health care provider. °Document Released: 10/31/2016 Document Revised: 10/31/2016 Document Reviewed: 10/31/2016 °Elsevier Interactive Patient Education © 2018 [employer] ° °

## 2017-12-30 LAB — CULTURE, OB URINE: Special Requests: NORMAL

## 2017-12-31 ENCOUNTER — Other Ambulatory Visit (HOSPITAL_COMMUNITY)
Admission: RE | Admit: 2017-12-31 | Discharge: 2017-12-31 | Disposition: A | Discharge status: Home / Self Care | Payer: Medicaid Other | Source: Ambulatory Visit | Attending: Obstetrics & Gynecology | Admitting: Obstetrics & Gynecology

## 2017-12-31 ENCOUNTER — Telehealth: Payer: Self-pay | Admitting: *Deleted

## 2017-12-31 ENCOUNTER — Encounter (HOSPITAL_COMMUNITY): Payer: Self-pay

## 2017-12-31 ENCOUNTER — Ambulatory Visit: Payer: Medicaid Other | Admitting: Obstetrics & Gynecology

## 2017-12-31 VITALS — BP 138/76 | HR 88 | Wt 193.0 lb

## 2017-12-31 DIAGNOSIS — O0990 Supervision of high risk pregnancy, unspecified, unspecified trimester: Secondary | ICD-10-CM | POA: Diagnosis present

## 2017-12-31 DIAGNOSIS — Z3480 Encounter for supervision of other normal pregnancy, unspecified trimester: Secondary | ICD-10-CM

## 2017-12-31 DIAGNOSIS — Z3A00 Weeks of gestation of pregnancy not specified: Secondary | ICD-10-CM | POA: Insufficient documentation

## 2017-12-31 DIAGNOSIS — Z98891 History of uterine scar from previous surgery: Secondary | ICD-10-CM

## 2017-12-31 DIAGNOSIS — O0993 Supervision of high risk pregnancy, unspecified, third trimester: Secondary | ICD-10-CM

## 2017-12-31 NOTE — Patient Instructions (Signed)
Cesarean Delivery Cesarean birth, or cesarean delivery, is the surgical delivery of a baby through an incision in the abdomen and the uterus. This may be referred to as a C-section. This procedure may be scheduled ahead of time, or it may be done in an emergency situation. Tell a health care provider about:  Any allergies you have.  All medicines you are taking, including vitamins, herbs, eye drops, creams, and over-the-counter medicines.  Any problems you or family members have had with anesthetic medicines.  Any blood disorders you have.  Any surgeries you have had.  Any medical conditions you have.  Whether you or any members of your family have a history of deep vein thrombosis (DVT) or pulmonary embolism (PE). What are the risks? Generally, this is a safe procedure. However, problems may occur, including:  Infection.  Bleeding.  Allergic reactions to medicines.  Damage to other structures or organs.  Blood clots.  Injury to your baby.  What happens before the procedure?  Follow instructions from your health care provider about eating or drinking restrictions.  Follow instructions from your health care provider about bathing before your procedure to help reduce your risk of infection.  If you know that you are going to have a cesarean delivery, do not shave your pubic area. Shaving before the procedure may increase your risk of infection.  Ask your health care provider about: ? Changing or stopping your regular medicines. This is especially important if you are taking diabetes medicines or blood thinners. ? Your pain management plan. This is especially important if you plan to breastfeed your baby. ? How long you will be in the hospital after the procedure. ? Any concerns you may have about receiving blood products if you need them during the procedure. ? Cord blood banking, if you plan to collect your baby's umbilical cord blood.  You may also want to ask your  health care provider: ? Whether you will be able to hold or breastfeed your baby while you are still in the operating room. ? Whether your baby can stay with you immediately after the procedure and during your recovery. ? Whether a family member or a person of your choice can go with you into the operating room and stay with you during the procedure, immediately after the procedure, and during your recovery.  Plan to have someone drive you home when you are discharged from the hospital. What happens during the procedure?  Fetal monitors will be placed on your abdomen to monitor your heart rate and your baby's heart rate.  Depending on the reason for your cesarean delivery, you may have a physical exam or additional testing, such as an ultrasound.  An IV tube will be inserted into one of your veins.  You may have your blood or urine tested.  You will be given antibiotic medicine to help prevent infection.  You may be given a special warming gown to wear to keep your temperature stable.  Hair may be removed from your pubic area.  The skin of your pubic area and lower abdomen will be cleaned with a germ-killing solution (antiseptic).  A catheter may be inserted into your bladder through your urethra. This drains your urine during the procedure.  You may be given one or more of the following: ? A medicine to numb the area (local anesthetic). ? A medicine to make you fall asleep (general anesthetic). ? A medicine (regional anesthetic) that is injected into your back or through a small   thin tube placed in your back (spinal anesthetic or epidural anesthetic). This numbs everything below the injection site and allows you to stay awake during your procedure. If this makes you feel nauseous, tell your health care provider. Medicines will be available to help reduce any nausea you may feel.  An incision will be made in your abdomen, and then in your uterus.  If you are awake during your  procedure, you may feel tugging and pulling in your abdomen, but you should not feel pain. If you feel pain, tell your health care provider immediately.  Your baby will be removed from your uterus. You may feel more pressure or pushing while this happens.  Immediately after birth, your baby will be dried and kept warm. You may be able to hold and breastfeed your baby. The umbilical cord may be clamped and cut during this time.  Your placenta will be removed from your uterus.  Your incisions will be closed with stitches (sutures). Staples, skin glue, or adhesive strips may also be applied to the incision in your abdomen.  Bandages (dressings) will be placed over the incision in your abdomen. The procedure may vary among health care providers and hospitals. What happens after the procedure?  Your blood pressure, heart rate, breathing rate, and blood oxygen level will be monitored often until the medicines you were given have worn off.  You may continue to receive fluids and medicines through an IV tube.  You will have some pain. Medicines will be available to help control your pain.  To help prevent blood clots: ? You may be given medicines. ? You may have to wear compression stockings or devices. ? You will be encouraged to walk around when you are able.  Hospital staff will encourage and support bonding with your baby. Your hospital may allow you and your baby to stay in the same room (rooming in) during your hospital stay to encourage successful breastfeeding.  You may be encouraged to cough and breathe deeply often. This helps to prevent lung problems.  If you have a catheter draining your urine, it will be removed as soon as possible after your procedure. This information is not intended to replace advice given to you by your health care provider. Make sure you discuss any questions you have with your health care provider. Document Released: 06/17/2005 Document Revised: 11/23/2015  Document Reviewed: 03/28/2015 Elsevier Interactive Patient Education  2018 [employer]vier Inc[employer]  

## 2017-12-31 NOTE — Telephone Encounter (Signed)
Spoke with patient and informed of C-Section time and date , pt stated somenone else had called her and she was aware.Marland Kitchen..Marland Kitchen

## 2017-12-31 NOTE — Progress Notes (Signed)
   PRENATAL VISIT NOTE  Subjective:  Jenna [name] is a 22 y.o. [id number] at 3491w6d being seen today for ongoing prenatal care.  She is currently monitored for the following issues for this low-risk pregnancy and has Tobacco smoking affecting pregnancy, antepartum; Social problem; History of pelvic fracture; MDD (major depressive disorder), recurrent severe, without psychosis (HCC); Post-partum depression; Encounter for supervision of normal pregnancy, unspecified, unspecified trimester; History of cesarean section; Late prenatal care; Atypical squamous cells cannot exclude high grade squamous intraepithelial lesion on cytologic smear of cervix (ASC-H); Anemia in pregnancy; and Supervision of high risk pregnancy, antepartum on their problem list.  Patient reports no complaints.  Contractions: Not present. Vag. Bleeding: None.  Movement: Present. Denies leaking of fluid.   The following portions of the patient's history were reviewed and updated as appropriate: allergies, current medications, past family history, past medical history, past social history, past surgical history and problem list. Problem list updated.  Objective:   Vitals:   12/31/17 1354  BP: 138/76  Pulse: 88  Weight: 193 lb (87.5 kg)    Fetal Status: Fetal Heart Rate (bpm): 130   Movement: Present     General:  Alert, oriented and cooperative. Patient is in no acute distress.  Skin: Skin is warm and dry. No rash noted.   Cardiovascular: Normal heart rate noted  Respiratory: Normal respiratory effort, no problems with respiration noted  Abdomen: Soft, gravid, appropriate for gestational age.  Pain/Pressure: Absent     Pelvic: Cervical exam performed        Extremities: Normal range of motion.     Mental Status: Normal mood and affect. Normal behavior. Normal judgment and thought content.   Assessment and Plan:  Pregnancy: [id number] at 2791w6d  There are no diagnoses linked to this encounter. Term labor symptoms and  general obstetric precautions including but not limited to vaginal bleeding, contractions, leaking of fluid and fetal movement were reviewed in detail with the patient. Please refer to After Visit Summary for other counseling recommendations.  Return in about 1 month (around 01/28/2018) for postpartum. She now declines TOLAC and RCS will be scheduled as close to 39 weeks as possible No future appointments.  Scheryl DarterJames Khristin Keleher, MD

## 2017-12-31 NOTE — Progress Notes (Signed)
Pt made aware that she needs GBS today, pt does not want swab.  States she "just swabbed the other day".

## 2018-01-02 ENCOUNTER — Inpatient Hospital Stay (HOSPITAL_COMMUNITY): Payer: Medicaid Other | Admitting: Certified Registered"

## 2018-01-02 ENCOUNTER — Encounter (HOSPITAL_COMMUNITY): Payer: Self-pay | Admitting: *Deleted

## 2018-01-02 ENCOUNTER — Inpatient Hospital Stay (HOSPITAL_COMMUNITY)
Admission: AD | Admit: 2018-01-02 | Discharge: 2018-01-04 | DRG: 784 | Disposition: A | Discharge status: Home / Self Care | Payer: Medicaid Other | Source: Ambulatory Visit | Attending: Family Medicine | Admitting: Family Medicine

## 2018-01-02 ENCOUNTER — Encounter (HOSPITAL_COMMUNITY)
Admission: AD | Disposition: A | Discharge status: Home / Self Care | Payer: Self-pay | Source: Ambulatory Visit | Attending: Family Medicine

## 2018-01-02 DIAGNOSIS — F1290 Cannabis use, unspecified, uncomplicated: Secondary | ICD-10-CM | POA: Diagnosis present

## 2018-01-02 DIAGNOSIS — Z302 Encounter for sterilization: Secondary | ICD-10-CM | POA: Diagnosis not present

## 2018-01-02 DIAGNOSIS — O0930 Supervision of pregnancy with insufficient antenatal care, unspecified trimester: Secondary | ICD-10-CM

## 2018-01-02 DIAGNOSIS — F332 Major depressive disorder, recurrent severe without psychotic features: Secondary | ICD-10-CM | POA: Diagnosis present

## 2018-01-02 DIAGNOSIS — O34211 Maternal care for low transverse scar from previous cesarean delivery: Secondary | ICD-10-CM | POA: Diagnosis not present

## 2018-01-02 DIAGNOSIS — F53 Postpartum depression: Secondary | ICD-10-CM | POA: Diagnosis present

## 2018-01-02 DIAGNOSIS — Z87891 Personal history of nicotine dependence: Secondary | ICD-10-CM

## 2018-01-02 DIAGNOSIS — Z3A39 39 weeks gestation of pregnancy: Secondary | ICD-10-CM

## 2018-01-02 DIAGNOSIS — L910 Hypertrophic scar: Secondary | ICD-10-CM | POA: Diagnosis present

## 2018-01-02 DIAGNOSIS — O99324 Drug use complicating childbirth: Secondary | ICD-10-CM | POA: Diagnosis present

## 2018-01-02 DIAGNOSIS — Z98891 History of uterine scar from previous surgery: Secondary | ICD-10-CM

## 2018-01-02 DIAGNOSIS — O99345 Other mental disorders complicating the puerperium: Secondary | ICD-10-CM | POA: Diagnosis present

## 2018-01-02 LAB — STREP GP B NAA: Strep Gp B NAA: NEGATIVE

## 2018-01-02 LAB — CBC
HCT: 30.6 % — ABNORMAL LOW (ref 36.0–46.0)
Hemoglobin: 10.2 g/dL — ABNORMAL LOW (ref 12.0–15.0)
MCH: 27.4 pg (ref 26.0–34.0)
MCHC: 33.3 g/dL (ref 30.0–36.0)
MCV: 82.3 fL (ref 78.0–100.0)
Platelets: 195 10*3/uL (ref 150–400)
RBC: 3.72 MIL/uL — ABNORMAL LOW (ref 3.87–5.11)
RDW: 13.8 % (ref 11.5–15.5)
WBC: 11.9 10*3/uL — AB (ref 4.0–10.5)

## 2018-01-02 LAB — CREATININE, SERUM: CREATININE: 0.55 mg/dL (ref 0.44–1.00)

## 2018-01-02 LAB — GLUCOSE, CAPILLARY: Glucose-Capillary: 75 mg/dL (ref 70–99)

## 2018-01-02 LAB — CERVICOVAGINAL ANCILLARY ONLY
CHLAMYDIA, DNA PROBE: NEGATIVE
NEISSERIA GONORRHEA: NEGATIVE

## 2018-01-02 LAB — TYPE AND SCREEN
ABO/RH(D): O POS
Antibody Screen: NEGATIVE

## 2018-01-02 SURGERY — Surgical Case
Anesthesia: Spinal

## 2018-01-02 MED ORDER — OXYCODONE HCL 5 MG PO TABS: 5.0000 mg | ORAL_TABLET | ORAL | Status: DC | PRN

## 2018-01-02 MED ORDER — FENTANYL CITRATE (PF) 100 MCG/2ML IJ SOLN
INTRAMUSCULAR | Status: DC | PRN
  Administered 2018-01-02: 10 ug via INTRATHECAL

## 2018-01-02 MED ORDER — LACTATED RINGERS IV SOLN
INTRAVENOUS | Status: DC
  Administered 2018-01-02: 08:00:00 via INTRAVENOUS

## 2018-01-02 MED ORDER — IBUPROFEN 600 MG PO TABS
600.0000 mg | ORAL_TABLET | Freq: Four times a day (QID) | ORAL | Status: DC
  Administered 2018-01-02 – 2018-01-04 (×7): 600 mg via ORAL
  Filled 2018-01-02 (×7): qty 1

## 2018-01-02 MED ORDER — OXYCODONE HCL 5 MG PO TABS: 5.0000 mg | ORAL_TABLET | Freq: Once | ORAL | Status: DC | PRN

## 2018-01-02 MED ORDER — CEFAZOLIN SODIUM-DEXTROSE 2-4 GM/100ML-% IV SOLN
2.0000 g | INTRAVENOUS | Status: AC
  Administered 2018-01-02: 2 g via INTRAVENOUS
  Filled 2018-01-02: qty 100

## 2018-01-02 MED ORDER — DIBUCAINE 1 % RE OINT: 1.0000 "application " | TOPICAL_OINTMENT | RECTAL | Status: DC | PRN

## 2018-01-02 MED ORDER — OXYTOCIN 40 UNITS IN LACTATED RINGERS INFUSION - SIMPLE MED
INTRAVENOUS | Status: DC | PRN
  Administered 2018-01-02: 600 mL via INTRAVENOUS

## 2018-01-02 MED ORDER — NALBUPHINE HCL 10 MG/ML IJ SOLN: 5.0000 mg | INTRAMUSCULAR | Status: DC | PRN

## 2018-01-02 MED ORDER — DEXTROSE 5 % IV SOLN
INTRAVENOUS | Status: DC | PRN
  Administered 2018-01-02: 20 ug/min via INTRAVENOUS

## 2018-01-02 MED ORDER — FENTANYL CITRATE (PF) 100 MCG/2ML IJ SOLN
INTRAMUSCULAR | Status: AC
  Filled 2018-01-02: qty 2

## 2018-01-02 MED ORDER — PHENYLEPHRINE 8 MG IN D5W 100 ML (0.08MG/ML) PREMIX OPTIME
INJECTION | INTRAVENOUS | Status: AC
  Filled 2018-01-02: qty 100

## 2018-01-02 MED ORDER — HYDROMORPHONE HCL 1 MG/ML IJ SOLN: 0.2500 mg | INTRAMUSCULAR | Status: DC | PRN

## 2018-01-02 MED ORDER — DIPHENHYDRAMINE HCL 50 MG/ML IJ SOLN: 12.5000 mg | INTRAMUSCULAR | Status: DC | PRN

## 2018-01-02 MED ORDER — ONDANSETRON HCL 4 MG/2ML IJ SOLN
INTRAMUSCULAR | Status: AC
  Filled 2018-01-02: qty 2

## 2018-01-02 MED ORDER — GLYCOPYRROLATE 0.2 MG/ML IJ SOLN
INTRAMUSCULAR | Status: DC | PRN
  Administered 2018-01-02: .15 mg via INTRAVENOUS

## 2018-01-02 MED ORDER — MORPHINE SULFATE (PF) 0.5 MG/ML IJ SOLN
INTRAMUSCULAR | Status: AC
  Filled 2018-01-02: qty 10

## 2018-01-02 MED ORDER — BUPIVACAINE HCL (PF) 0.25 % IJ SOLN
INTRAMUSCULAR | Status: AC
  Filled 2018-01-02: qty 30

## 2018-01-02 MED ORDER — ZOLPIDEM TARTRATE 5 MG PO TABS: 5.0000 mg | ORAL_TABLET | Freq: Every evening | ORAL | Status: DC | PRN

## 2018-01-02 MED ORDER — COCONUT OIL OIL: 1.0000 "application " | TOPICAL_OIL | Status: DC | PRN

## 2018-01-02 MED ORDER — MENTHOL 3 MG MT LOZG: 1.0000 | LOZENGE | OROMUCOSAL | Status: DC | PRN

## 2018-01-02 MED ORDER — DIPHENHYDRAMINE HCL 25 MG PO CAPS: 25.0000 mg | ORAL_CAPSULE | ORAL | Status: DC | PRN

## 2018-01-02 MED ORDER — SODIUM CHLORIDE 0.9 % IR SOLN
Status: DC | PRN
  Administered 2018-01-02: 1000 mL

## 2018-01-02 MED ORDER — KETOROLAC TROMETHAMINE 30 MG/ML IJ SOLN
INTRAMUSCULAR | Status: AC
  Filled 2018-01-02: qty 1

## 2018-01-02 MED ORDER — OXYTOCIN 40 UNITS IN LACTATED RINGERS INFUSION - SIMPLE MED: 2.5000 [IU]/h | INTRAVENOUS | Status: AC

## 2018-01-02 MED ORDER — ACETAMINOPHEN 325 MG PO TABS
650.0000 mg | ORAL_TABLET | ORAL | Status: DC | PRN
  Administered 2018-01-02 – 2018-01-04 (×7): 650 mg via ORAL
  Filled 2018-01-02 (×7): qty 2

## 2018-01-02 MED ORDER — TETANUS-DIPHTH-ACELL PERTUSSIS 5-2.5-18.5 LF-MCG/0.5 IM SUSP: 0.5000 mL | Freq: Once | INTRAMUSCULAR | Status: DC

## 2018-01-02 MED ORDER — DEXTROSE IN LACTATED RINGERS 5 % IV SOLN
INTRAVENOUS | Status: DC
  Administered 2018-01-02 – 2018-01-03 (×2): via INTRAVENOUS

## 2018-01-02 MED ORDER — SENNOSIDES-DOCUSATE SODIUM 8.6-50 MG PO TABS
2.0000 | ORAL_TABLET | ORAL | Status: DC
  Administered 2018-01-02 – 2018-01-04 (×2): 2 via ORAL
  Filled 2018-01-02 (×2): qty 2

## 2018-01-02 MED ORDER — SCOPOLAMINE 1 MG/3DAYS TD PT72
1.0000 | MEDICATED_PATCH | Freq: Once | TRANSDERMAL | Status: DC
  Administered 2018-01-02: 1.5 mg via TRANSDERMAL

## 2018-01-02 MED ORDER — SODIUM CHLORIDE 0.9% FLUSH: 3.0000 mL | INTRAVENOUS | Status: DC | PRN

## 2018-01-02 MED ORDER — SCOPOLAMINE 1 MG/3DAYS TD PT72
MEDICATED_PATCH | TRANSDERMAL | Status: AC
  Filled 2018-01-02: qty 1

## 2018-01-02 MED ORDER — DIPHENHYDRAMINE HCL 25 MG PO CAPS
25.0000 mg | ORAL_CAPSULE | Freq: Four times a day (QID) | ORAL | Status: DC | PRN
  Administered 2018-01-02: 25 mg via ORAL
  Filled 2018-01-02: qty 1

## 2018-01-02 MED ORDER — ENOXAPARIN SODIUM 40 MG/0.4ML ~~LOC~~ SOLN
40.0000 mg | SUBCUTANEOUS | Status: DC
  Administered 2018-01-03: 40 mg via SUBCUTANEOUS
  Filled 2018-01-02: qty 0.4

## 2018-01-02 MED ORDER — MEPERIDINE HCL 25 MG/ML IJ SOLN: 6.2500 mg | INTRAMUSCULAR | Status: DC | PRN

## 2018-01-02 MED ORDER — NALOXONE HCL 0.4 MG/ML IJ SOLN: 0.4000 mg | INTRAMUSCULAR | Status: DC | PRN

## 2018-01-02 MED ORDER — ONDANSETRON HCL 4 MG/2ML IJ SOLN
INTRAMUSCULAR | Status: DC | PRN
  Administered 2018-01-02: 4 mg via INTRAVENOUS

## 2018-01-02 MED ORDER — SIMETHICONE 80 MG PO CHEW
80.0000 mg | CHEWABLE_TABLET | ORAL | Status: DC
  Administered 2018-01-04: 80 mg via ORAL

## 2018-01-02 MED ORDER — NALOXONE HCL 4 MG/10ML IJ SOLN
1.0000 ug/kg/h | INTRAVENOUS | Status: DC | PRN
  Filled 2018-01-02: qty 5

## 2018-01-02 MED ORDER — MEASLES, MUMPS & RUBELLA VAC ~~LOC~~ INJ
0.5000 mL | INJECTION | Freq: Once | SUBCUTANEOUS | Status: DC
  Filled 2018-01-02: qty 0.5

## 2018-01-02 MED ORDER — BUPIVACAINE IN DEXTROSE 0.75-8.25 % IT SOLN
INTRATHECAL | Status: DC | PRN
  Administered 2018-01-02: 1.6 mL via INTRATHECAL

## 2018-01-02 MED ORDER — SIMETHICONE 80 MG PO CHEW: 80.0000 mg | CHEWABLE_TABLET | ORAL | Status: DC | PRN

## 2018-01-02 MED ORDER — OXYCODONE HCL 5 MG PO TABS
10.0000 mg | ORAL_TABLET | ORAL | Status: DC | PRN
  Administered 2018-01-02 – 2018-01-04 (×9): 10 mg via ORAL
  Filled 2018-01-02 (×9): qty 2

## 2018-01-02 MED ORDER — PRENATAL MULTIVITAMIN CH
1.0000 | ORAL_TABLET | Freq: Every day | ORAL | Status: DC
  Administered 2018-01-03: 1 via ORAL
  Filled 2018-01-02: qty 1

## 2018-01-02 MED ORDER — WITCH HAZEL-GLYCERIN EX PADS: 1.0000 "application " | MEDICATED_PAD | CUTANEOUS | Status: DC | PRN

## 2018-01-02 MED ORDER — ONDANSETRON HCL 4 MG/2ML IJ SOLN: 4.0000 mg | Freq: Three times a day (TID) | INTRAMUSCULAR | Status: DC | PRN

## 2018-01-02 MED ORDER — SIMETHICONE 80 MG PO CHEW
80.0000 mg | CHEWABLE_TABLET | Freq: Three times a day (TID) | ORAL | Status: DC
  Administered 2018-01-02 – 2018-01-04 (×6): 80 mg via ORAL
  Filled 2018-01-02 (×7): qty 1

## 2018-01-02 MED ORDER — GLYCOPYRROLATE 0.2 MG/ML IJ SOLN
INTRAMUSCULAR | Status: AC
  Filled 2018-01-02: qty 1

## 2018-01-02 MED ORDER — KETOROLAC TROMETHAMINE 30 MG/ML IJ SOLN
30.0000 mg | Freq: Once | INTRAMUSCULAR | Status: AC | PRN
  Administered 2018-01-02: 30 mg via INTRAVENOUS

## 2018-01-02 MED ORDER — OXYCODONE HCL 5 MG/5ML PO SOLN: 5.0000 mg | Freq: Once | ORAL | Status: DC | PRN

## 2018-01-02 MED ORDER — NALBUPHINE HCL 10 MG/ML IJ SOLN: 5.0000 mg | Freq: Once | INTRAMUSCULAR | Status: DC | PRN

## 2018-01-02 MED ORDER — OXYTOCIN 10 UNIT/ML IJ SOLN
INTRAMUSCULAR | Status: AC
Start: 2018-01-02 — End: ?
  Filled 2018-01-02: qty 4

## 2018-01-02 MED ORDER — BUPIVACAINE HCL (PF) 0.25 % IJ SOLN
INTRAMUSCULAR | Status: DC | PRN
  Administered 2018-01-02: 30 mL

## 2018-01-02 MED ORDER — MORPHINE SULFATE (PF) 0.5 MG/ML IJ SOLN
INTRAMUSCULAR | Status: DC | PRN
  Administered 2018-01-02: .2 mg via INTRATHECAL

## 2018-01-02 MED ORDER — PROMETHAZINE HCL 25 MG/ML IJ SOLN: 6.2500 mg | INTRAMUSCULAR | Status: DC | PRN

## 2018-01-02 SURGICAL SUPPLY — 33 items
BENZOIN TINCTURE PRP APPL 2/3 (GAUZE/BANDAGES/DRESSINGS) ×3 IMPLANT
CHLORAPREP W/TINT 26ML (MISCELLANEOUS) ×3 IMPLANT
CLAMP CORD UMBIL (MISCELLANEOUS) IMPLANT
CLIP FILSHIE TUBAL LIGA STRL (Clip) ×3 IMPLANT
CLOSURE WOUND 1/2 X4 (GAUZE/BANDAGES/DRESSINGS) ×1
CLOTH BEACON ORANGE TIMEOUT ST (SAFETY) ×3 IMPLANT
DRSG OPSITE POSTOP 4X10 (GAUZE/BANDAGES/DRESSINGS) ×3 IMPLANT
ELECT REM PT RETURN 9FT ADLT (ELECTROSURGICAL) ×3
ELECTRODE REM PT RTRN 9FT ADLT (ELECTROSURGICAL) ×1 IMPLANT
EXTRACTOR VACUUM M CUP 4 TUBE (SUCTIONS) IMPLANT
EXTRACTOR VACUUM M CUP 4' TUBE (SUCTIONS)
GLOVE BIOGEL PI IND STRL 7.0 (GLOVE) ×2 IMPLANT
GLOVE BIOGEL PI INDICATOR 7.0 (GLOVE) ×4
GLOVE ECLIPSE 7.0 STRL STRAW (GLOVE) ×6 IMPLANT
GOWN STRL REUS W/TWL LRG LVL3 (GOWN DISPOSABLE) ×6 IMPLANT
KIT ABG SYR 3ML LUER SLIP (SYRINGE) IMPLANT
NEEDLE HYPO 22GX1.5 SAFETY (NEEDLE) ×3 IMPLANT
NEEDLE HYPO 25X5/8 SAFETYGLIDE (NEEDLE) IMPLANT
NS IRRIG 1000ML POUR BTL (IV SOLUTION) ×3 IMPLANT
PACK C SECTION WH (CUSTOM PROCEDURE TRAY) ×3 IMPLANT
PAD ABD 7.5X8 STRL (GAUZE/BANDAGES/DRESSINGS) ×6 IMPLANT
PAD OB MATERNITY 4.3X12.25 (PERSONAL CARE ITEMS) ×3 IMPLANT
PENCIL SMOKE EVAC W/HOLSTER (ELECTROSURGICAL) ×3 IMPLANT
RTRCTR C-SECT PINK 25CM LRG (MISCELLANEOUS) ×3 IMPLANT
SPONGE GAUZE 4X4 12PLY STER LF (GAUZE/BANDAGES/DRESSINGS) ×3 IMPLANT
STRIP CLOSURE SKIN 1/2X4 (GAUZE/BANDAGES/DRESSINGS) ×2 IMPLANT
SUT VIC AB 0 CTX 36 (SUTURE) ×6
SUT VIC AB 0 CTX36XBRD ANBCTRL (SUTURE) ×3 IMPLANT
SUT VIC AB 4-0 KS 27 (SUTURE) ×3 IMPLANT
SYR 30ML LL (SYRINGE) ×3 IMPLANT
TAPE CLOTH SURG 4X10 WHT LF (GAUZE/BANDAGES/DRESSINGS) ×3 IMPLANT
TOWEL OR 17X24 6PK STRL BLUE (TOWEL DISPOSABLE) ×3 IMPLANT
TRAY FOLEY W/BAG SLVR 14FR LF (SET/KITS/TRAYS/PACK) ×3 IMPLANT

## 2018-01-02 NOTE — Transfer of Care (Signed)
Immediate Anesthesia Transfer of Care Note  Patient: Jenna [name]  Procedure(s) Performed: CESAREAN SECTION (N/A )  Patient Location: PACU  Anesthesia Type:Spinal  Level of Consciousness: awake, alert  and oriented  Airway & Oxygen Therapy: Patient Spontanous Breathing  Post-op Assessment: Report given to RN and Post -op Vital signs reviewed and stable  Post vital signs: Reviewed and stable  Last Vitals:  Vitals Value Taken Time  BP    Temp    Pulse 83 01/02/2018 11:14 AM  Resp    SpO2 99 % 01/02/2018 11:14 AM  Vitals shown include unvalidated device data.  Last Pain:  Vitals:   01/02/18 0808  TempSrc: Oral  PainSc: 0-No pain      Patients Stated Pain Goal: 4 (01/02/18 [id number])  Complications: No apparent anesthesia complications

## 2018-01-02 NOTE — Op Note (Signed)
Preoperative Diagnosis:  IUP @ 1335w1d, Prior C-section x 1, undesired fertility  Postoperative Diagnosis:  Same  Procedure: Repeat low transverse cesarean section, Bilateral Tubal Ligation with Filshie clips, resection of keloid scar  Surgeon: Tinnie Gensanya Chapin Arduini, M.D.  Assistant: Therese SarahMeryl Davis, MD An experienced assistant was required given the standard of surgical care given the complexity of the case.  This assistant was needed for exposure, dissection, suctioning, retraction, instrument exchange, assisting with delivery with administration of fundal pressure, and for overall help during the procedure.  Anesthesia: spinal with Lowella CurbMiller, Warren Ray, MD  Findings: Viable female infant, APGAR (1 MIN): 9   APGAR (5 MINS): 9, weight pending, Normal tubes and ovaries  Estimated blood loss: 1000 cc  Complications: None known  Specimens: Placenta to labor and delivery  Reason for procedure: Briefly, the patient is a 22 y.o. [id number] at 3435w1d with h/o previous C-section who desires permanent sterility.  Patient counseled, r.e. Risks benefits of BTL, including permanency of procedure, risk of failure(1:100), increased risk of ectopic.  Patient verbalized understanding and desires to proceed   Procedure: Patient is a to the OR where spinal analgesia was administered. She was then placed in a supine position with left lateral tilt. She received 2 g of Ancef and SCDs were in place. A Foley catheter was placed in the bladder. She was prepped and draped in the usual sterile fashion. A timeout was performed. A knife was then used to make a Pfannenstiel incision. An old keloid scar was excised with this incision. This incision was carried out to underlying fascia which was divided in the midline with the electrocautery. The incision was extended laterally, sharply. The fascia was dissected off the underlying rectus superiorly and inferiorily.  The rectus was divided in the midline. The peritoneal cavity was entered  bluntly. Incision extended sharply laterally with the electrocautery. Alexis retractor was placed inside the incision.  A knife was used to make a low transverse incision on the uterus. This incision was carried down to the amniotic cavity was entered. Fetus was in LOA position and was brought up out of the incision without difficulty. Cord was clamped x 2 and cut. Infant taken to waiting pediatrician.  Cord blood was obtained. Placenta was delivered from the uterus.  Uterus was cleaned with dry lap pads. Uterine incision closed with 0 Vicryl suture in a locked running fashion. Attention was turned to the pt's left tube which was grasped with a Babcock clamp and followed to its fimbriated end.  A Filshie clip was placed across the tube 1.5 cm from the cornu.  Attention was turned to the pt's right tube which was grasped with a Babcock clamp and followed to its fimbriated end.  A Filshie clip was placed across the tube 1.5 cm from the cornu. A 1cc of 0.25% Marcaine was injected into the surrounding tubes bilaterally.  Alexis retractor was removed from the abdomen. Peritoneal closure was done with 0 Vicryl suture.  Fascia is closed with 0 Vicryl suture in a running fashion. Skin closed using 3-0 Vicryl on a Keith needle. Subcutaneous tissue infused with 30cc 0.25% Marcaine.  Steri strips applied, followed by pressure dressing.  All instrument, needle and lap counts were correct x 2.  Patient was awake and taken to PACU stable.  Infant remained in couplet care, stable.

## 2018-01-02 NOTE — Anesthesia Preprocedure Evaluation (Signed)
Anesthesia Evaluation  Patient identified by MRN, date of birth, ID band Patient awake    Reviewed: Allergy & Precautions, NPO status , Patient's Chart, lab work & pertinent test results  Airway Mallampati: II  TM Distance: >3 FB Neck ROM: Full    Dental no notable dental hx.    Pulmonary neg pulmonary ROS, Current Smoker, former smoker,    Pulmonary exam normal breath sounds clear to auscultation       Cardiovascular negative cardio ROS Normal cardiovascular exam Rhythm:Regular Rate:Normal     Neuro/Psych negative neurological ROS  negative psych ROS   GI/Hepatic negative GI ROS, Neg liver ROS,   Endo/Other  negative endocrine ROS  Renal/GU negative Renal ROS  negative genitourinary   Musculoskeletal negative musculoskeletal ROS (+)   Abdominal   Peds negative pediatric ROS (+)  Hematology negative hematology ROS (+)   Anesthesia Other Findings   Reproductive/Obstetrics negative OB ROS (+) Pregnancy                             Anesthesia Physical  Anesthesia Plan  ASA: II  Anesthesia Plan: Spinal   Post-op Pain Management:    Induction:   PONV Risk Score and Plan: Treatment may vary due to age or medical condition  Airway Management Planned: Simple Face Mask  Additional Equipment:   Intra-op Plan:   Post-operative Plan:   Informed Consent:   Plan Discussed with:   Anesthesia Plan Comments:         Anesthesia Quick Evaluation
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
## 2018-01-02 NOTE — Anesthesia Procedure Notes (Signed)
Spinal  Patient location during procedure: OB Start time: 01/02/2018 9:57 AM End time: 01/02/2018 10:02 AM Staffing Anesthesiologist: Lowella CurbMiller, Eudell Mcphee Ray, MD Performed: anesthesiologist  Preanesthetic Checklist Completed: patient identified, surgical consent, pre-op evaluation, timeout performed, IV checked, risks and benefits discussed and monitors and equipment checked Spinal Block Patient position: sitting Prep: site prepped and draped and DuraPrep Patient monitoring: heart rate, cardiac monitor, continuous pulse ox and blood pressure Approach: midline Location: L3-4 Injection technique: single-shot Needle Needle type: Pencan  Needle gauge: 24 G Needle length: 10 cm Assessment Sensory level: T4

## 2018-01-02 NOTE — Progress Notes (Signed)
Pt  Complaining of dizziness and nausea. "I feel like my blood sugar dropped". PT given apple juice, CBG 75 upon assessment

## 2018-01-02 NOTE — Anesthesia Postprocedure Evaluation (Signed)
Anesthesia Post Note  Patient: Jenna [name]  Procedure(s) Performed: CESAREAN SECTION (N/A )     Patient location during evaluation: PACU Anesthesia Type: Spinal Level of consciousness: oriented and awake and alert Pain management: pain level controlled Vital Signs Assessment: post-procedure vital signs reviewed and stable Respiratory status: spontaneous breathing and respiratory function stable Cardiovascular status: blood pressure returned to baseline and stable Postop Assessment: no headache, no backache and no apparent nausea or vomiting Anesthetic complications: no    Last Vitals:  Vitals:   01/02/18 1219 01/02/18 1227  BP: 115/83 127/83  Pulse: (!) 52 (!) 52  Resp: 18 18  Temp:  (!) 36.4 C  SpO2: 99% 100%    Last Pain:  Vitals:   01/02/18 1217  TempSrc:   PainSc: 0-No pain   Pain Goal: Patients Stated Pain Goal: 4 (01/02/18 0808)               Lowella CurbWarren Ray Miller

## 2018-01-03 ENCOUNTER — Other Ambulatory Visit: Payer: Self-pay

## 2018-01-03 LAB — RPR: RPR: NONREACTIVE

## 2018-01-03 LAB — CBC
HCT: 25 % — ABNORMAL LOW (ref 36.0–46.0)
HEMOGLOBIN: 8.3 g/dL — AB (ref 12.0–15.0)
MCH: 27.6 pg (ref 26.0–34.0)
MCHC: 33.2 g/dL (ref 30.0–36.0)
MCV: 83.1 fL (ref 78.0–100.0)
Platelets: 173 10*3/uL (ref 150–400)
RBC: 3.01 MIL/uL — ABNORMAL LOW (ref 3.87–5.11)
RDW: 13.8 % (ref 11.5–15.5)
WBC: 12.6 10*3/uL — ABNORMAL HIGH (ref 4.0–10.5)

## 2018-01-03 MED ORDER — FERROUS SULFATE 325 (65 FE) MG PO TABS
325.0000 mg | ORAL_TABLET | Freq: Two times a day (BID) | ORAL | Status: DC
  Administered 2018-01-03 – 2018-01-04 (×2): 325 mg via ORAL
  Filled 2018-01-03 (×2): qty 1

## 2018-01-03 NOTE — Progress Notes (Signed)
POSTPARTUM PROGRESS NOTE  Post Partum Day 1  Subjective:  Jenna [name] is a 22 y.o. [id number] s/p rLTCS at 1016w1d.  No acute events overnight.  Pt denies problems with ambulating, voiding or po intake.  She denies nausea or vomiting.  Pain is well controlled.  She has had flatus. She has not had bowel movement.  Lochia Small.   Objective: Blood pressure 108/84, pulse (!) 55, temperature 98.2 F (36.8 C), temperature source Oral, resp. rate 17, height 5\' 5"  (1.651 m), weight 189 lb 6.4 oz (85.9 kg), last menstrual period 03/28/2017, SpO2 100 %, unknown if currently breastfeeding.  Physical Exam:  General: alert, cooperative and no distress Chest: no respiratory distress Heart:regular rate, distal pulses intact Abdomen: soft, nontender,  Uterine Fundus: firm, appropriately tender DVT Evaluation: No calf swelling or tenderness Extremities: no edema Skin:  incision intact, old dried blood noted on left aspect of dressing, boarders marked with pen.   Recent Labs    01/02/18 0822 01/03/18 0553  HGB 10.2* 8.3*  HCT 30.6* 25.0*    Assessment/Plan: Jenna [name] is a 22 y.o. [id number] s/p rLTCS at 6016w1d   PPD#1 - Doing well Contraception: BC patch  Feeding: formula Dispo: Plan for discharge tomorrow. #Anemia: PO iron supplements initiated for hgb of 8.3   LOS: 1 day   Sharyon CableRogers, Dailyn Reith C, CNM 01/03/2018, 11:51 AM

## 2018-01-04 MED ORDER — OXYCODONE HCL 5 MG PO TABS: 5.0000 mg | ORAL_TABLET | Freq: Four times a day (QID) | ORAL | 0 refills | Status: DC | PRN

## 2018-01-04 NOTE — Discharge Instructions (Signed)

## 2018-01-04 NOTE — Progress Notes (Signed)
CSW received consult for [name] due to history of anxiety and depression. CSW met with [name] to discuss history. [name] reports that she is taking Ambien daily to make her comfortable throughout the night. [name] stated that she is not receiving therapy or counseling anywhere but has a great support system outside of the hospital. [name] reports a good and stable mood since delivery. CSW and [name] discussed obtaining [place] certification for newborn, [facility] [place]. [name] requested that CSW acquire additional formula to take home since she only had $14 to her name and no other source of income. CSW spoke with Raquel Sarna, RN to ask if this was possible. Raquel Sarna, RN agreed to give [name] some additional formula to take home. CSW informed [name] that her RN would bring the formula prior to discharge and this was a one time instance, not a reoccurring thing. [name] expressed gratitude for CSW interactoin and assistance.  CSW encouraged [name] to reach out if questions or needs arise.  Jenna [name], MSW, [place] Social Worker [facility] [phone]

## 2018-01-04 NOTE — Discharge Summary (Signed)
OB Discharge Summary     Patient Name: Jenna [name] DOB: 01-30-1996 MRN: [id number]  Date of admission: 01/02/2018 Delivering MD: Reva BoresPRATT, TANYA S   Date of discharge: 01/04/2018  Admitting diagnosis: RCS Intrauterine pregnancy: [id number]     Secondary diagnosis:  Principal Problem:   History of cesarean section Active Problems:   Encounter for sterilization   Postpartum care following cesarean delivery  Additional problems: tobacco use, major depressive disorder, postpartum depression, late prenatal care     Discharge diagnosis: Term Pregnancy Delivered                                                                                                Post partum procedures:none  Augmentation: none  Complications: None  Hospital course:  Sceduled C/S   22 y.o. yo [id number] at 3232w1d was admitted to the hospital 01/02/2018 for scheduled cesarean section with the following indication:Elective Repeat.  Membrane Rupture Time/Date: 10:39 AM ,01/02/2018   Patient delivered a Viable infant.01/02/2018  Details of operation can be found in separate operative note.  Pateint had an uncomplicated postpartum course.  She is ambulating, tolerating a regular diet, passing flatus, and urinating well. Patient is discharged home in stable condition on  01/04/18         Physical exam  Vitals:   01/03/18 0831 01/03/18 1537 01/03/18 2120 01/04/18 0554  BP: 108/84 119/83 127/78 122/77  Pulse: (!) 55 71 62 61  Resp: 17 18 18 16   Temp: 98.2 F (36.8 C) 98 F (36.7 C) 99 F (37.2 C) 98.9 F (37.2 C)  TempSrc: Oral  Oral Oral  SpO2: 100%     Weight:      Height:       General: alert, cooperative and no distress Lochia: appropriate Uterine Fundus: firm Incision: Healing well with no significant drainage, No significant erythema, Dressing is clean, dry, and intact DVT Evaluation: No evidence of DVT seen on physical exam. Labs: Lab Results  Component Value Date   WBC 12.6 (H) 01/03/2018   HGB 8.3 (L)  01/03/2018   HCT 25.0 (L) 01/03/2018   MCV 83.1 01/03/2018   PLT 173 01/03/2018   CMP Latest Ref Rng & Units 01/02/2018  Glucose 65 - 99 mg/dL -  BUN 6 - 20 mg/dL -  Creatinine 8.460.44 - 9.621.00 mg/dL 9.520.55  Sodium 841135 - 324145 mmol/L -  Potassium 3.5 - 5.1 mmol/L -  Chloride 101 - 111 mmol/L -  CO2 22 - 32 mmol/L -  Calcium 8.9 - 10.3 mg/dL -  Total Protein 6.5 - 8.1 g/dL -  Total Bilirubin 0.3 - 1.2 mg/dL -  Alkaline Phos 38 - 401126 U/L -  AST 15 - 41 U/L -  ALT 14 - 54 U/L -    Discharge instruction: per After Visit Summary and "Baby and Me Booklet".  After visit meds:  Allergies as of 01/04/2018   No Known Allergies     Medication List    STOP taking these medications   cyclobenzaprine 10 MG tablet Commonly known as:  FLEXERIL     TAKE these medications  acetaminophen 325 MG tablet Commonly known as:  TYLENOL Take 650 mg by mouth every 6 (six) hours as needed for mild pain or headache.   oxyCODONE 5 MG immediate release tablet Commonly known as:  Oxy IR/ROXICODONE Take 1 tablet (5 mg total) by mouth every 6 (six) hours as needed for up to 20 doses for moderate pain or severe pain.   PRENATAL GUMMIES/DHA & FA 0.4-32.5 MG Chew Chew 1 tablet by mouth 2 (two) times daily.   zolpidem 5 MG tablet Commonly known as:  AMBIEN Take 1 tablet (5 mg total) by mouth at bedtime as needed for sleep.            Discharge Care Instructions  (From admission, onward)        Start     Ordered   01/04/18 0000  Discharge wound care:    Comments:  The dressing may come off on its own. If it does not come off by Friday, you can take it off in the shower with warm water. If there are strips of tape underneath, also remove these.   01/04/18 1610      Diet: routine diet  Activity: Advance as tolerated. Pelvic rest for 6 weeks.   Outpatient follow up:2 weeks Follow up Appt:No future appointments. Follow up Visit:No follow-ups on file.  Postpartum contraception: Tubal  Ligation  Newborn Data: Live born female  Birth Weight: 6 lb 13.7 oz (3110 g) APGAR: 9, 9  Newborn Delivery   Birth date/time:  01/02/2018 10:39:00 Delivery type:  C-Section, Low Transverse Trial of labor:  No C-section categorization:  Repeat     Baby Feeding: Bottle Disposition:home with mother   01/04/2018 Ted Mcalpine, MD

## 2018-01-26 ENCOUNTER — Encounter: Payer: Self-pay | Admitting: Obstetrics and Gynecology

## 2018-01-31 ENCOUNTER — Inpatient Hospital Stay (HOSPITAL_COMMUNITY)
Admission: AD | Admit: 2018-01-31 | Discharge: 2018-01-31 | Disposition: A | Discharge status: Home / Self Care | Payer: Medicaid Other | Source: Ambulatory Visit | Attending: Obstetrics and Gynecology | Admitting: Obstetrics and Gynecology

## 2018-01-31 ENCOUNTER — Other Ambulatory Visit: Payer: Self-pay

## 2018-01-31 ENCOUNTER — Encounter (HOSPITAL_COMMUNITY): Payer: Self-pay

## 2018-01-31 DIAGNOSIS — O900 Disruption of cesarean delivery wound: Secondary | ICD-10-CM | POA: Diagnosis not present

## 2018-01-31 DIAGNOSIS — F17210 Nicotine dependence, cigarettes, uncomplicated: Secondary | ICD-10-CM | POA: Insufficient documentation

## 2018-01-31 DIAGNOSIS — T8131XA Disruption of external operation (surgical) wound, not elsewhere classified, initial encounter: Secondary | ICD-10-CM

## 2018-01-31 LAB — URINALYSIS, ROUTINE W REFLEX MICROSCOPIC
BILIRUBIN URINE: NEGATIVE
Glucose, UA: NEGATIVE mg/dL
Hgb urine dipstick: NEGATIVE
Ketones, ur: NEGATIVE mg/dL
Nitrite: NEGATIVE
PROTEIN: NEGATIVE mg/dL
SPECIFIC GRAVITY, URINE: 1.008 (ref 1.005–1.030)
pH: 6 (ref 5.0–8.0)

## 2018-01-31 LAB — POCT PREGNANCY, URINE: PREG TEST UR: NEGATIVE

## 2018-01-31 MED ORDER — CEPHALEXIN 500 MG PO CAPS: 500.0000 mg | ORAL_CAPSULE | Freq: Four times a day (QID) | ORAL | 0 refills | Status: AC

## 2018-01-31 MED ORDER — IBUPROFEN 600 MG PO TABS: 600.0000 mg | ORAL_TABLET | Freq: Four times a day (QID) | ORAL | 0 refills | Status: DC | PRN

## 2018-01-31 NOTE — MAU Note (Signed)
Pt. States she had a c-section on 01/02/18. Pt. Reports her incision has had some bleeding and drainage since 1 week post op.  Pt reports she did not make it to her PP FU appt.
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
## 2018-01-31 NOTE — Discharge Instructions (Signed)
Wound Dehiscence °Wound dehiscence is when a cut from surgery (an incision) opens up and does not heal like it should. This problem usually happens 7-10 days after surgery. You may have bleeding from the cut. You may also have pain or a fever. This condition should be treated early. °Follow these instructions at home: °Medicines °· Take over-the-counter and prescription medicines only as told by your doctor. °· If you were prescribed an antibiotic medicine, take it as told by your doctor. Do not stop taking it even if you start to feel better. °· Use medicines that help to stop itching as told by your doctor. The wound may feel itchy as it heals. °Wound care °· Follow instructions from your doctor about how to take care of your wound. Make sure you: °? Wash your hands with soap and water before you change your bandage (dressing) or wash the wound area. If you cannot use soap and water, use hand sanitizer. °? Wash your wound with mild soap and water 2 times a day, or as told. Rinse off the soap. Pat the area dry with a clean towel. Do not rub the wound. °? Change bandages as told by your doctor. °· Do not pick or scratch at the wound. °· Check your wound area every day for signs of infection. Check for: °? More redness, swelling, or pain. °? More fluid or blood. °? Warmth. °? Pus or a bad smell. °Activity °· Avoid exercises that make you sweat or could stretch your wound. °· Do not lift anything that is heavier than 10 lb (4.5 kg) until the wound is healed or until your doctor says that it is safe. °General instructions °· Do not take baths, swim, or use a hot tub until your doctor approves. You may take showers. °· Keep all follow-up visits as told by your doctor. This is important. °Contact a doctor if: °· Your wound does not seem to be healing right. °· You have a fever. °Get help right away if: °· You have more redness, swelling, or pain around your wound. °· You have more fluid coming from your wound. °· Your  wound feels warm to the touch. °· You have pus or a bad smell coming from your wound. °· More of the wound breaks open. °· You have red streaks spreading from your wound. °· You have a lot of bleeding from your wound. °Summary °· Wound dehiscence is when a cut from surgery (an incision) opens up and does not heal like it should. °· Follow instructions from your doctor about how to take care of your wound. °· Check your wound area every day for signs of infection. These signs can be redness, swelling, pain, fluid, blood, warmth, pus, or a bad smell. °· If you were prescribed an antibiotic medicine, take it as told by your doctor. Do not stop taking it even if you start to feel better. °· Contact a doctor if your wound does not seem to be healing right. °This information is not intended to replace advice given to you by your health care provider. Make sure you discuss any questions you have with your health care provider. °Document Released: 06/05/2009 Document Revised: 05/22/2016 Document Reviewed: 05/22/2016 °Elsevier Interactive Patient Education © 2017 [employer] ° °

## 2018-02-02 ENCOUNTER — Encounter: Payer: Self-pay | Admitting: Obstetrics and Gynecology

## 2018-02-02 ENCOUNTER — Ambulatory Visit (INDEPENDENT_AMBULATORY_CARE_PROVIDER_SITE_OTHER): Payer: Medicaid Other | Admitting: Obstetrics and Gynecology

## 2018-02-02 ENCOUNTER — Encounter: Payer: Self-pay | Admitting: *Deleted

## 2018-02-02 DIAGNOSIS — Z1389 Encounter for screening for other disorder: Secondary | ICD-10-CM | POA: Diagnosis not present

## 2018-02-02 DIAGNOSIS — Z3480 Encounter for supervision of other normal pregnancy, unspecified trimester: Secondary | ICD-10-CM

## 2018-02-02 NOTE — Progress Notes (Signed)
..  Post Partum Exam  Jenna [name] is a 22 y.o. [id number] female who presents for a postpartum visit. She is 4 weeks postpartum following a low cervical transverse Cesarean section. I have fully reviewed the prenatal and intrapartum course. The delivery was at 39.1 gestational weeks.  Anesthesia: spinal. Postpartum course has been good. Baby's course has been good. Baby is feeding by bottle - Gerber Soy. Bleeding no bleeding. Bowel function is normal. Bladder function is normal. Patient is sexually active. Contraception method is tubal ligation. Postpartum depression screening:neg. Patient seen in MAU on 01/31/2018 and diagnosed with a skin infection. Patient started Keflex today    Last pap smear done 08/2017 and was Abnormal- ACUS-H, normal colposcopy  Review of Systems Pertinent items are noted in HPI.    Objective:  Blood pressure (!) 145/89, pulse 76, height 5\' 5"  (1.651 m), weight 170 lb 3.2 oz (77.2 kg), last menstrual period 03/28/2017, unknown if currently breastfeeding.  General:  alert, cooperative and no distress   Breasts:  inspection negative, no nipple discharge or bleeding, no masses or nodularity palpable  Lungs: clear to auscultation bilaterally  Heart:  regular rate and rhythm  Abdomen: soft, non-tender; bowel sounds normal; no masses,  no organomegaly and incision is healing well. 2 areas of skin separation visualized on both extremes of the incision. Silver nitrate applied   Vulva:  normal  Vagina: normal vagina, no discharge, exudate, lesion, or erythema  Cervix:  multiparous appearance  Corpus: normal size, contour, position, consistency, mobility, non-tender  Adnexa:  normal adnexa and no mass, fullness, tenderness  Rectal Exam: Not performed.        Assessment:    Normal postpartum exam. Pap smear not done at today's visit.   Plan:   1. Contraception: tubal ligation 2. Patient is medically cleared to resume all activities of daily living. Patient desires to  return to work- work note provided 3. Follow up in: 1 week for incision check and again in  7 months for annual exam with repeat pap smear or as needed. Monitor BP

## 2018-02-10 ENCOUNTER — Encounter: Payer: Self-pay | Admitting: Family Medicine

## 2018-02-10 ENCOUNTER — Ambulatory Visit: Payer: Self-pay | Admitting: Family Medicine

## 2018-02-10 NOTE — Progress Notes (Signed)
Patient did not keep appointment today. She may call to reschedule.  

## 2018-02-19 IMAGING — US US OB COMP LESS 14 WK
1 series · 15 of 28 positions shown · non-contrast
Comparison: Pelvic ultrasound 11/07/2016

CLINICAL DATA: Patient with abdominal pain.

EXAM:
OBSTETRIC <14 WK US AND TRANSVAGINAL OB US
TECHNIQUE: Both transabdominal and transvaginal ultrasound examinations were
performed for complete evaluation of the gestation as well as the
maternal uterus, adnexal regions, and pelvic cul-de-sac.
Transvaginal technique was performed to assess early pregnancy.

[Series 1: us ob comp less 14 wk · 15 of 68 slices shown]
[im 1/68]
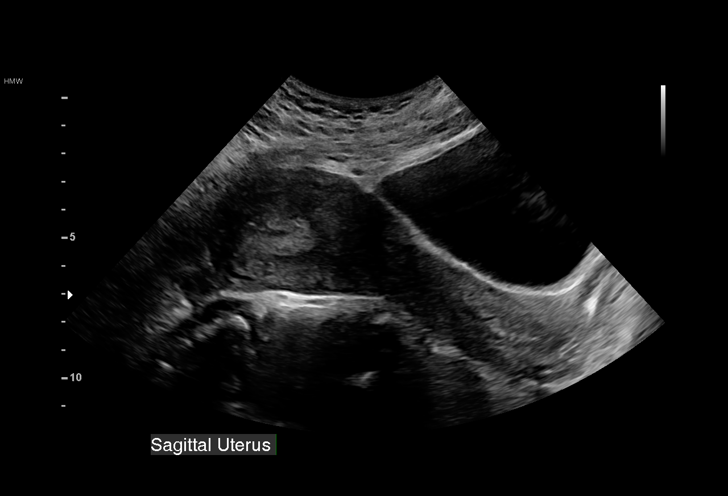
[im 5/68]
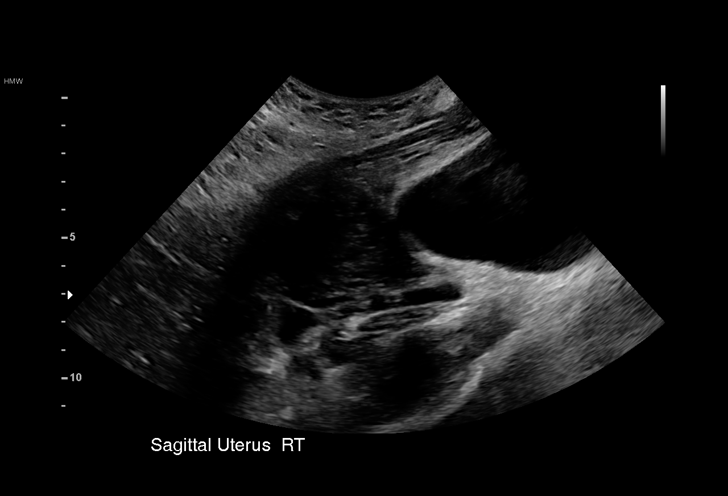
[im 10/68]
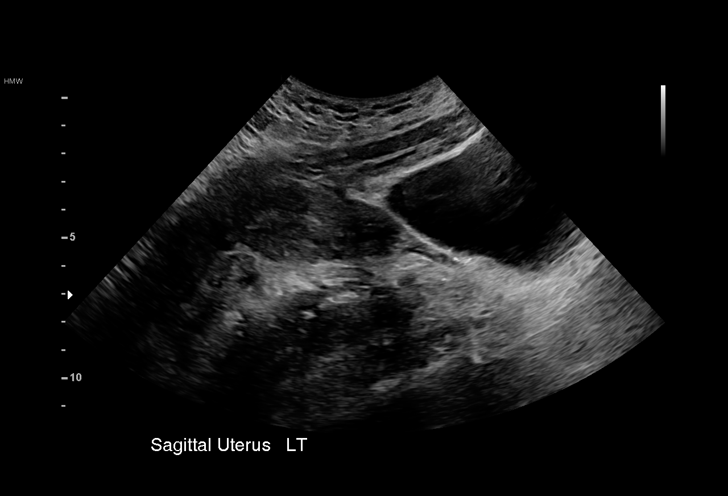
[im 15/68]
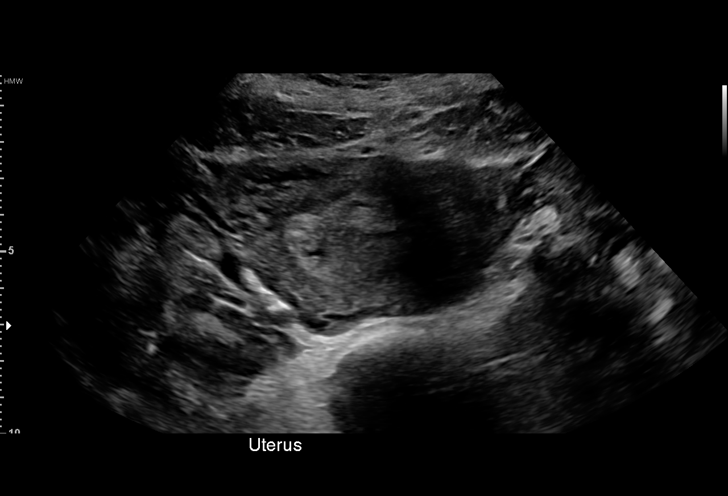
[im 20/68]
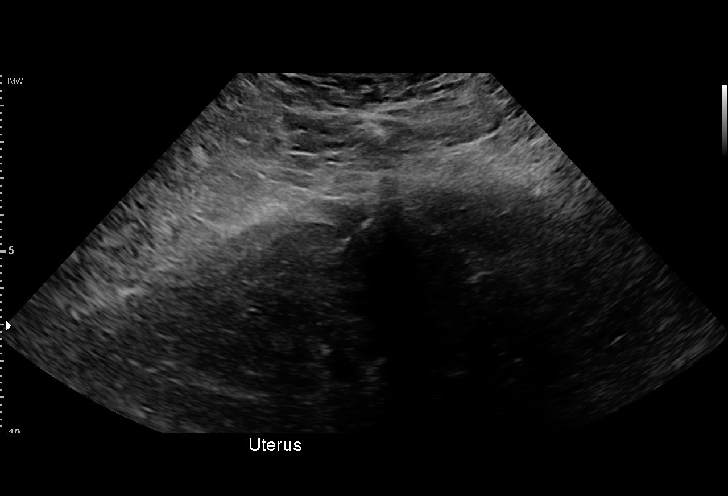
[im 25/68]
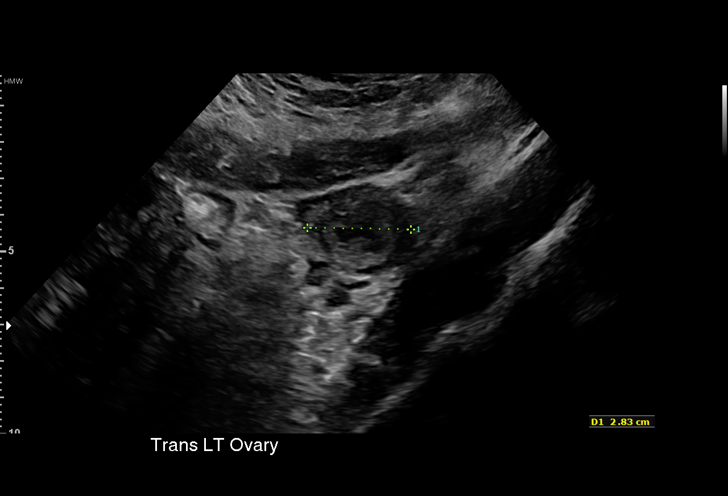
[im 30/68]
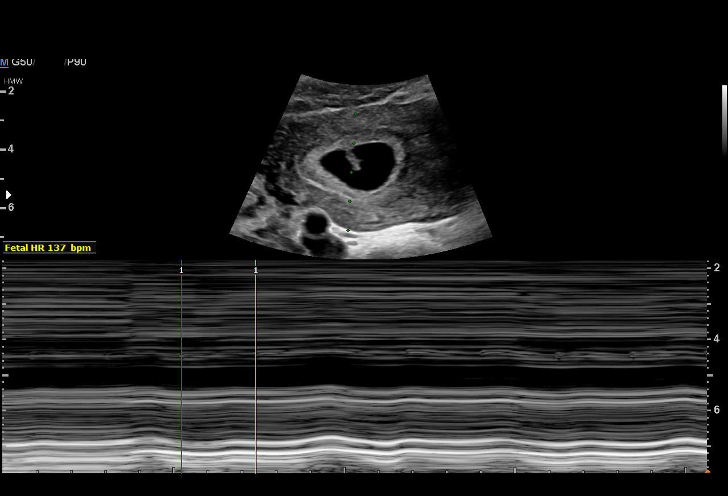
[im 35/68]
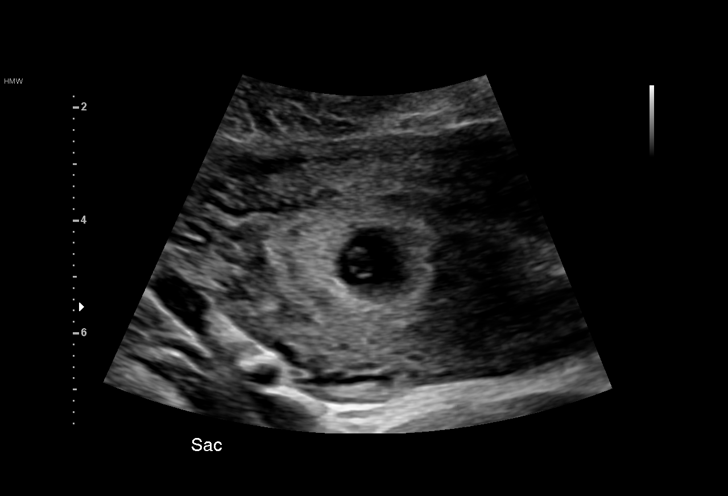
[im 38/68]
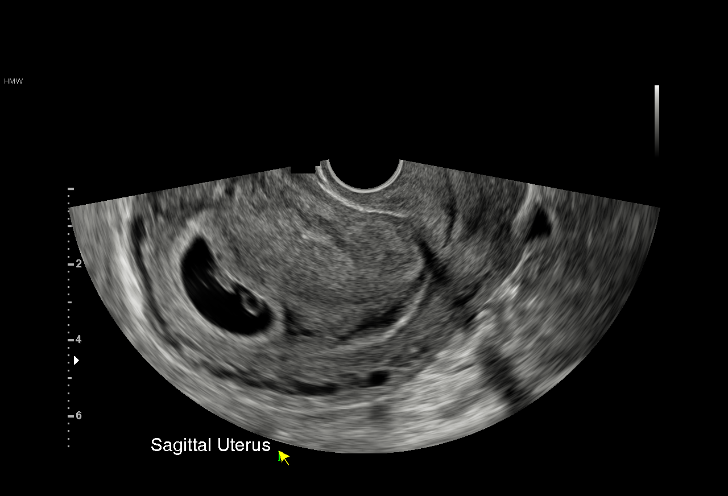
[im 43/68]
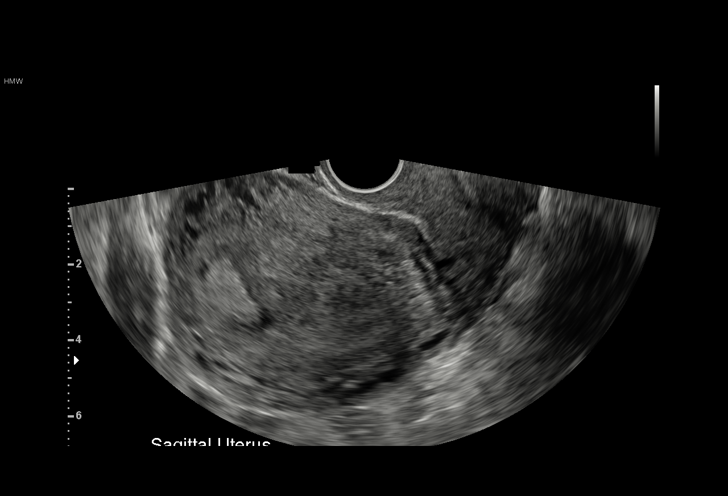
[im 48/68]
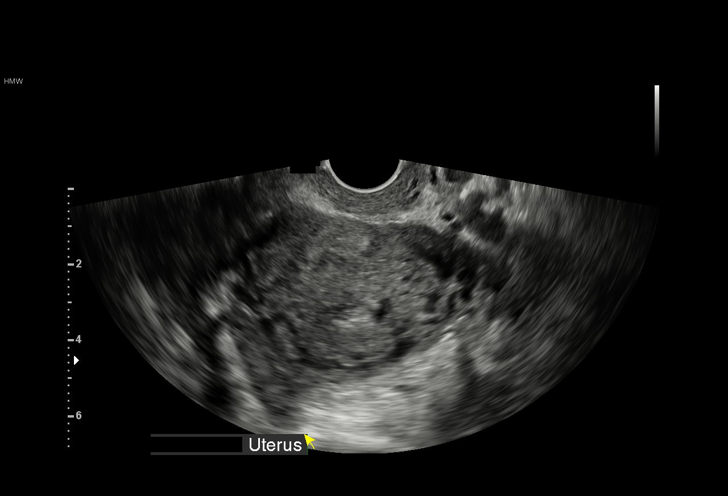
[im 53/68]
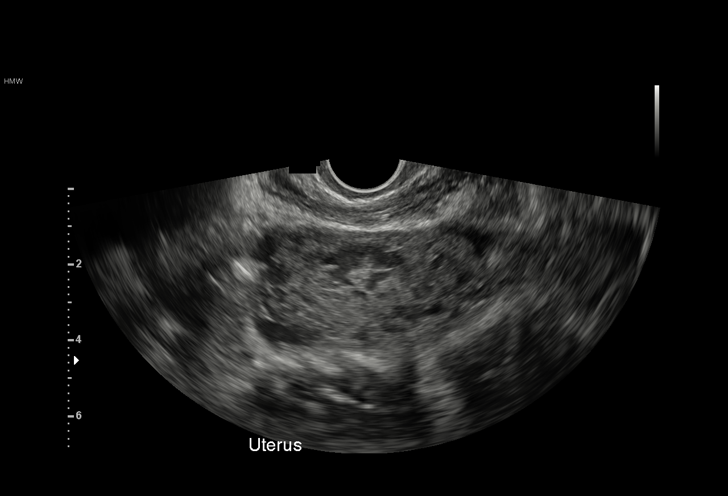
[im 58/68]
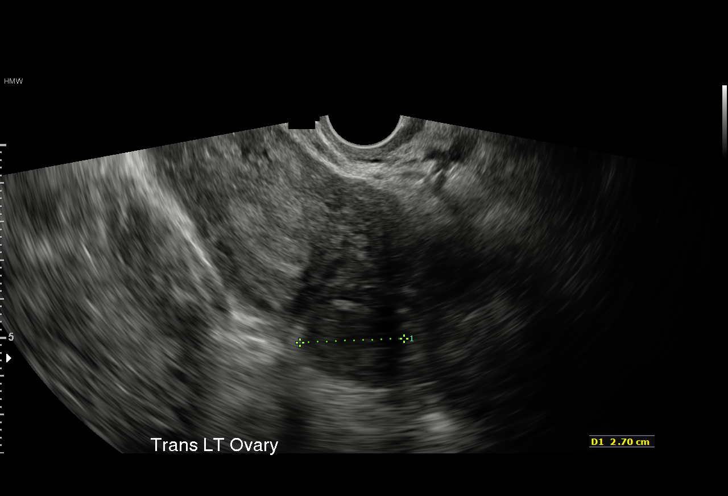
[im 63/68]
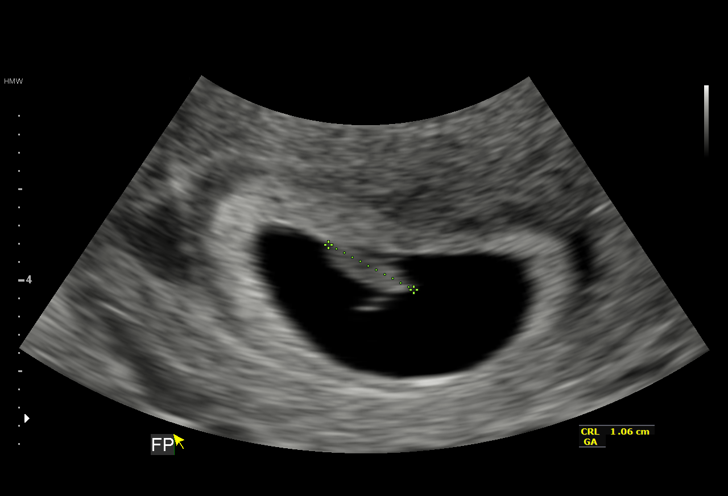
[im 68/68]
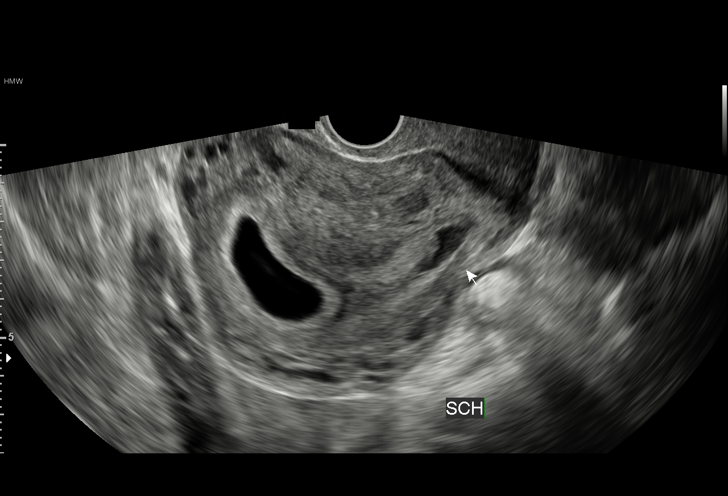

[15 of 28 positions shown; findings below may reference images not displayed]

FINDINGS: Intrauterine gestational sac: Single

Yolk sac:  Visualized.

Embryo:  Visualized.

Cardiac Activity: Visualized.

Heart Rate: 137  bpm

CRL:  10.1  mm   7 w   1 d                  US EDC: 01/08/2018

Subchorionic hemorrhage:  None visualized.

Maternal uterus/adnexae: The right ovary is not visualized. The left
ovary is normal. Small amount of free fluid in the pelvis.
IMPRESSION: Single live intrauterine gestation.  Small subchorionic hemorrhage.

## 2018-02-23 ENCOUNTER — Encounter (HOSPITAL_COMMUNITY): Payer: Self-pay

## 2018-03-12 IMAGING — US US OB COMP LESS 14 WK
1 series · 15 of 28 positions shown · non-contrast
Comparison: None.

CLINICAL DATA: Low abdominal pain.

EXAM:
OBSTETRIC <14 WK US AND TRANSVAGINAL OB US
TECHNIQUE: Both transabdominal and transvaginal ultrasound examinations were
performed for complete evaluation of the gestation as well as the
maternal uterus, adnexal regions, and pelvic cul-de-sac.
Transvaginal technique was performed to assess early pregnancy.

[Series 1: us ob comp less 14 wk · 15 of 33 slices shown]
[im 1/33]
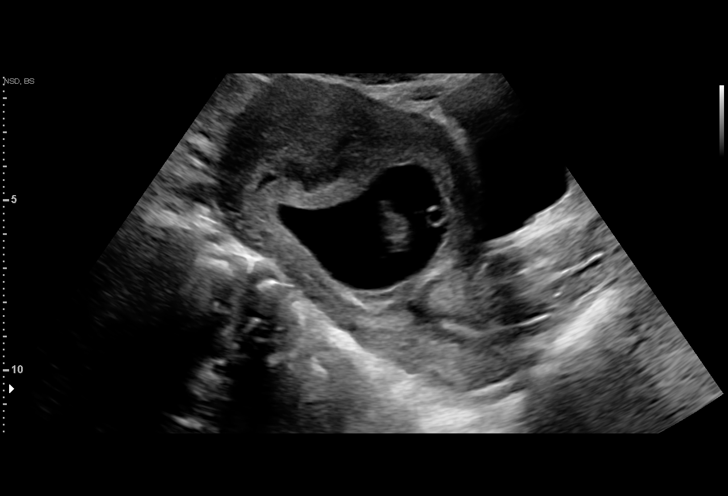
[im 3/33]
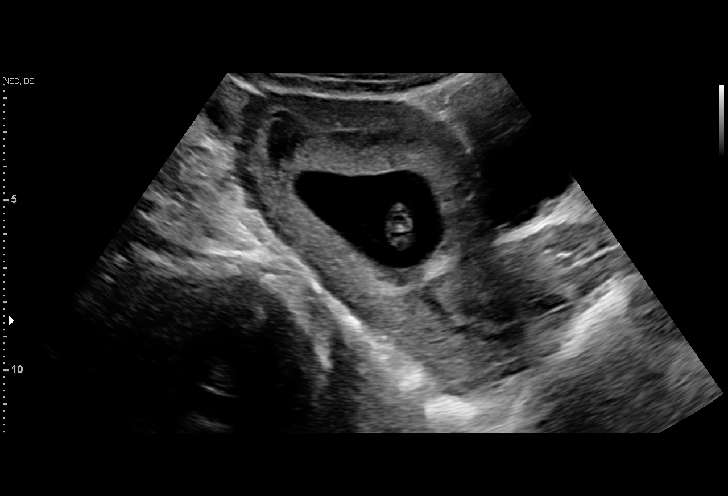
[im 5/33]
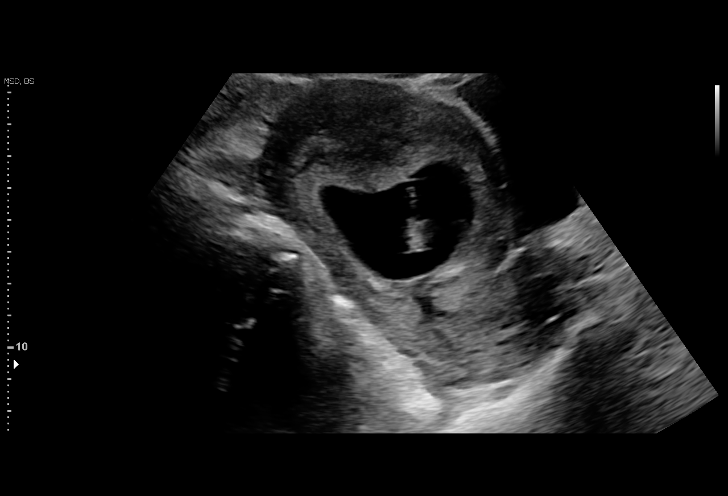
[im 8/33]
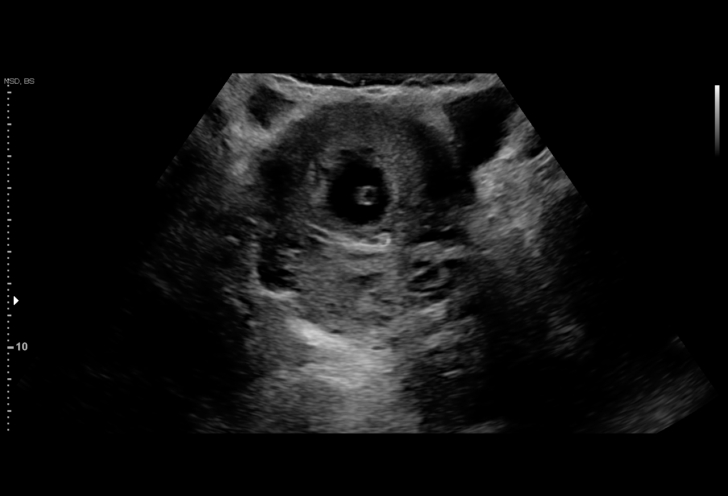
[im 10/33]
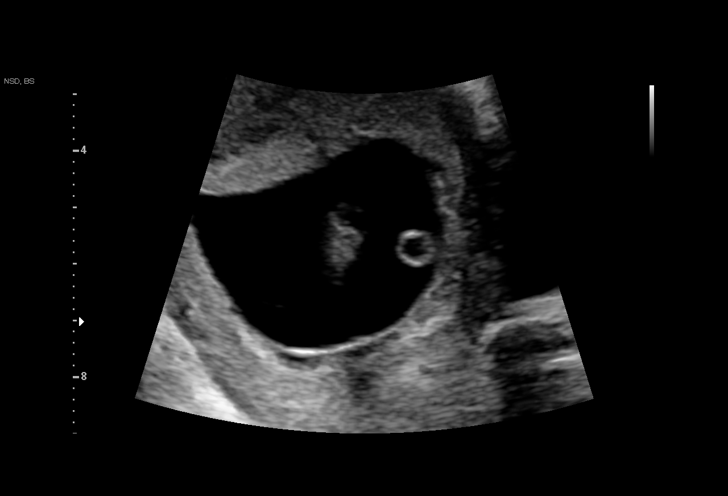
[im 12/33]
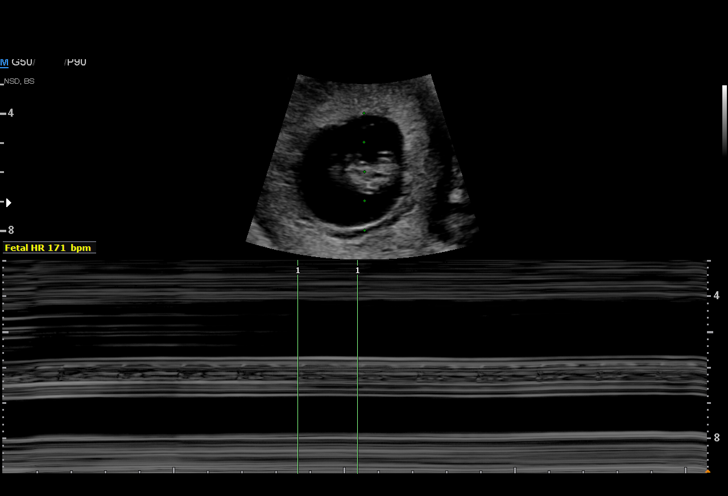
[im 15/33]
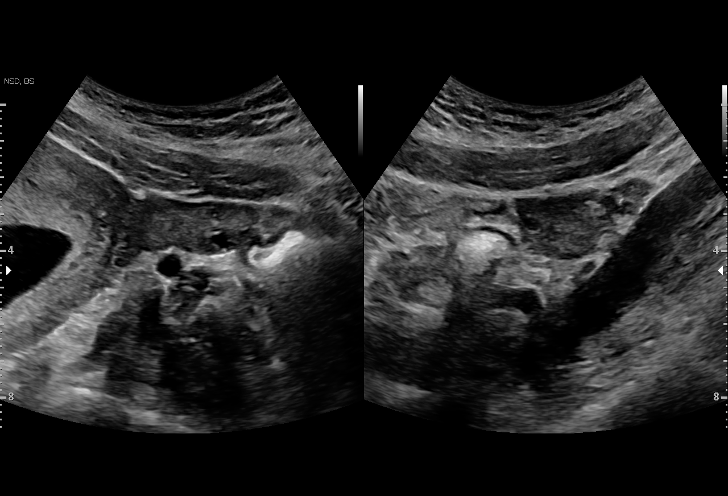
[im 17/33]
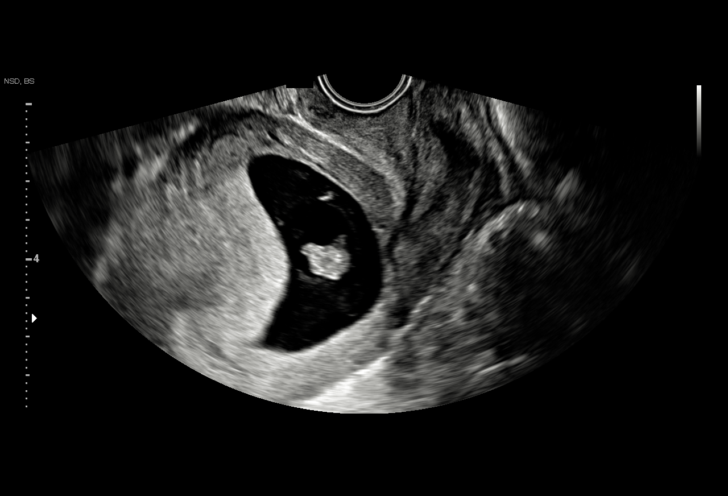
[im 18/33]
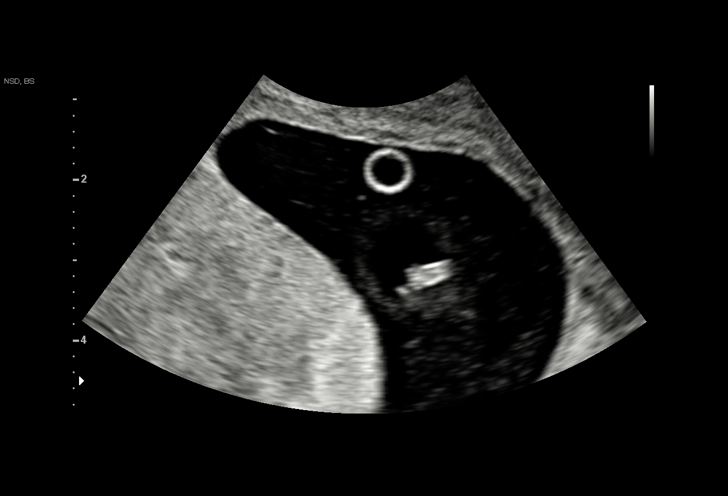
[im 21/33]
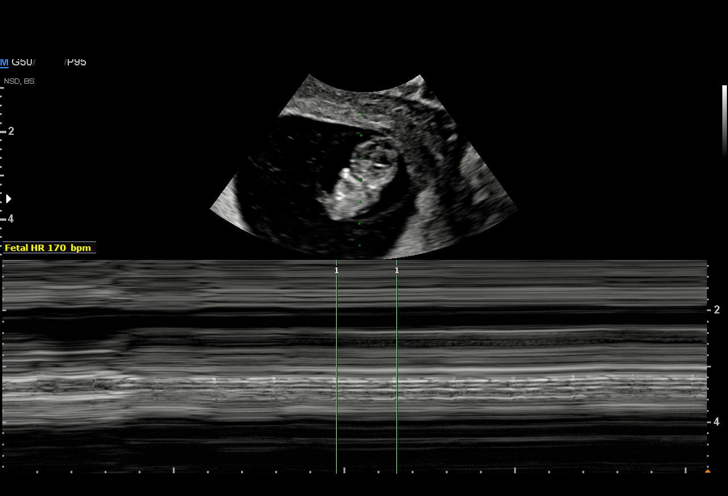
[im 23/33]
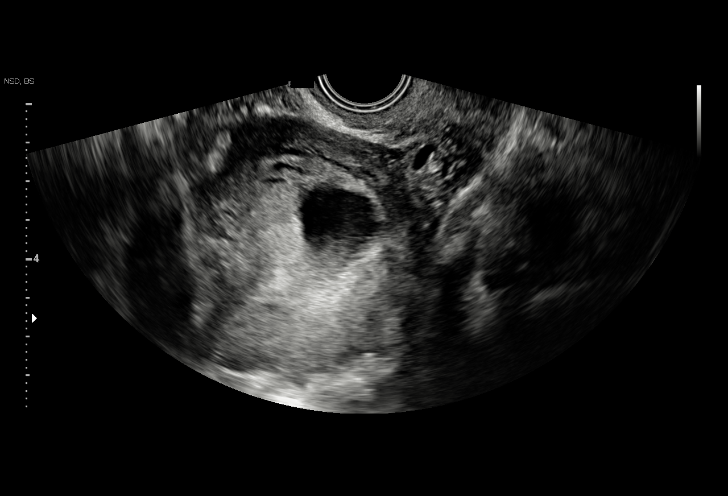
[im 25/33]
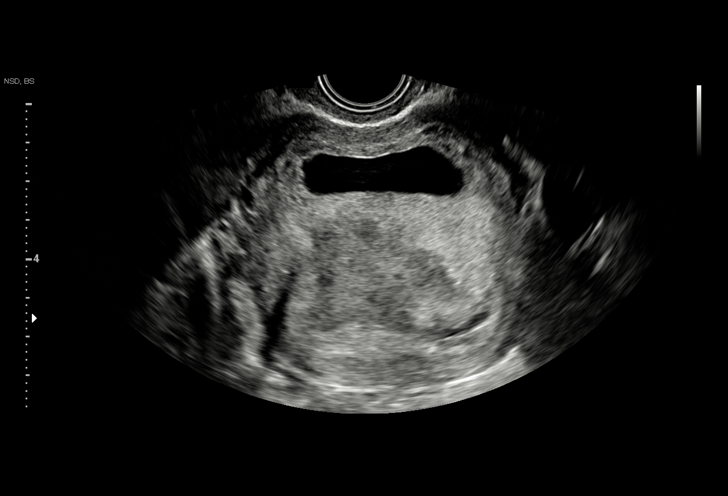
[im 28/33]
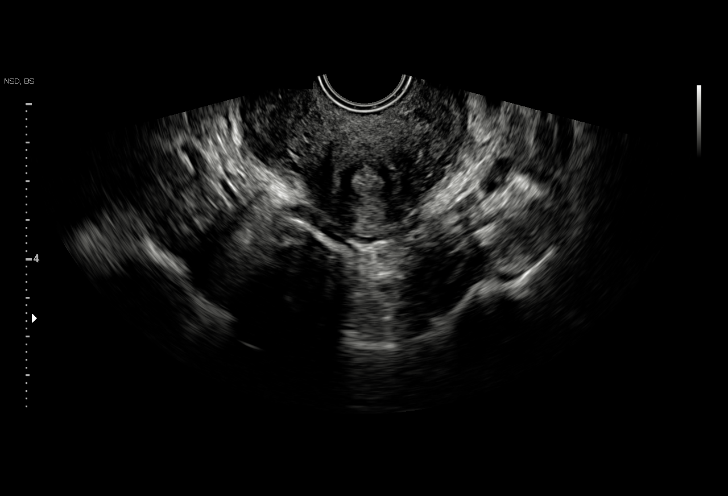
[im 30/33]
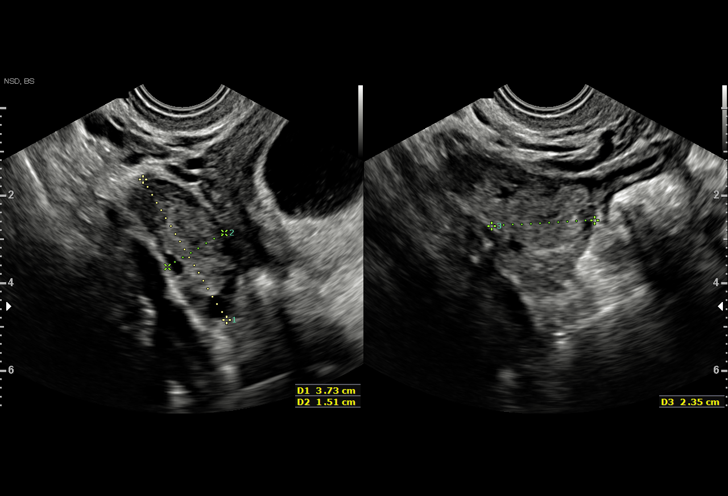
[im 33/33]
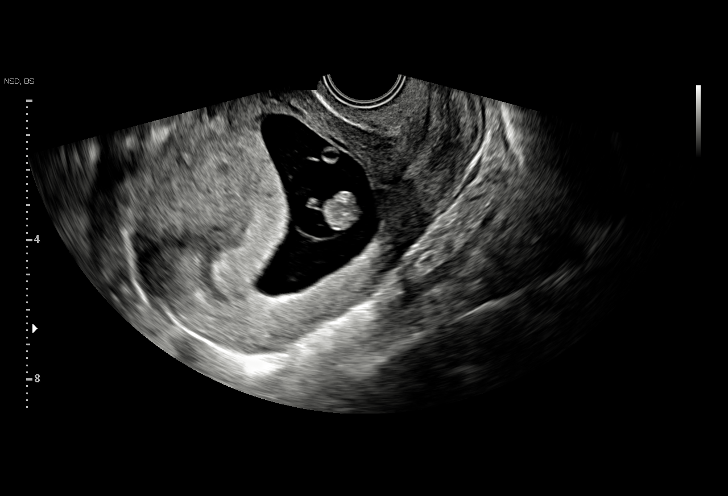

[15 of 28 positions shown; findings below may reference images not displayed]

FINDINGS: Intrauterine gestational sac: Single

Yolk sac:  Visualized.

Embryo:  Visualized.

Cardiac Activity: Visualized.

Heart Rate: 171  bpm

MSD:   mm    w     d

CRL:  2.38 cm 9 w   0 d                  US EDC: February 22, 2017

Subchorionic hemorrhage:  There is a small subchorionic hemorrhage.

Maternal uterus/adnexae: Normal.
IMPRESSION: Single live IUP with a small subchorionic hemorrhage.

## 2018-08-13 ENCOUNTER — Encounter (HOSPITAL_COMMUNITY): Payer: Self-pay | Admitting: *Deleted

## 2018-08-13 ENCOUNTER — Emergency Department (HOSPITAL_COMMUNITY)
Admission: EM | Admit: 2018-08-13 | Discharge: 2018-08-13 | Disposition: A | Discharge status: Home / Self Care | Payer: Medicaid Other | Attending: Emergency Medicine | Admitting: Emergency Medicine

## 2018-08-13 DIAGNOSIS — Z79899 Other long term (current) drug therapy: Secondary | ICD-10-CM | POA: Insufficient documentation

## 2018-08-13 DIAGNOSIS — F17210 Nicotine dependence, cigarettes, uncomplicated: Secondary | ICD-10-CM | POA: Insufficient documentation

## 2018-08-13 DIAGNOSIS — A599 Trichomoniasis, unspecified: Secondary | ICD-10-CM

## 2018-08-13 DIAGNOSIS — B9689 Other specified bacterial agents as the cause of diseases classified elsewhere: Secondary | ICD-10-CM

## 2018-08-13 DIAGNOSIS — N760 Acute vaginitis: Secondary | ICD-10-CM | POA: Insufficient documentation

## 2018-08-13 LAB — URINALYSIS, ROUTINE W REFLEX MICROSCOPIC
Bacteria, UA: NONE SEEN
Bilirubin Urine: NEGATIVE
Glucose, UA: NEGATIVE mg/dL
Hgb urine dipstick: NEGATIVE
Ketones, ur: NEGATIVE mg/dL
Nitrite: NEGATIVE
PH: 6 (ref 5.0–8.0)
Protein, ur: NEGATIVE mg/dL
Specific Gravity, Urine: 1.027 (ref 1.005–1.030)

## 2018-08-13 LAB — WET PREP, GENITAL
SPERM: NONE SEEN
Yeast Wet Prep HPF POC: NONE SEEN

## 2018-08-13 LAB — PREGNANCY, URINE: PREG TEST UR: NEGATIVE

## 2018-08-13 MED ORDER — CEFTRIAXONE SODIUM 1 G IJ SOLR
1.0000 g | Freq: Once | INTRAMUSCULAR | Status: AC
  Administered 2018-08-13: 1 g via INTRAMUSCULAR
  Filled 2018-08-13: qty 10

## 2018-08-13 MED ORDER — METRONIDAZOLE 500 MG PO TABS: 500.0000 mg | ORAL_TABLET | Freq: Two times a day (BID) | ORAL | 0 refills | Status: DC

## 2018-08-13 MED ORDER — ONDANSETRON 4 MG PO TBDP
4.0000 mg | ORAL_TABLET | Freq: Once | ORAL | Status: AC
  Administered 2018-08-13: 4 mg via ORAL
  Filled 2018-08-13: qty 1

## 2018-08-13 MED ORDER — METRONIDAZOLE 500 MG PO TABS
2000.0000 mg | ORAL_TABLET | Freq: Once | ORAL | Status: AC
  Administered 2018-08-13: 2000 mg via ORAL
  Filled 2018-08-13: qty 4

## 2018-08-13 MED ORDER — AZITHROMYCIN 250 MG PO TABS
1000.0000 mg | ORAL_TABLET | Freq: Once | ORAL | Status: AC
  Administered 2018-08-13: 1000 mg via ORAL
  Filled 2018-08-13: qty 4

## 2018-08-13 MED ORDER — LIDOCAINE HCL (PF) 1 % IJ SOLN
INTRAMUSCULAR | Status: AC
  Administered 2018-08-13: 0.9 mL
  Filled 2018-08-13: qty 5

## 2018-08-13 NOTE — ED Notes (Signed)
Patient verbalizes understanding of discharge instructions. Opportunity for questioning and answers were provided. Armband removed by staff, pt discharged from ED.  

## 2018-08-13 NOTE — ED Notes (Signed)
Lab called about add on urine preg. Requisition has been sent.
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
## 2018-08-13 NOTE — Discharge Instructions (Signed)
You were seen in the ER for concern for STD  You tested positive for trichomonas (a sexually transmitted disease) and bacterial vaginosis (not a sexually transmitted disease).  Take metronidazole as prescribed to treat bacterial vaginosis.   Results of gonorrhea and chlamydia are pending and you will get a phone call if results are positive in the next 48-72 hours.  You were treated for these infections.   You need to notify your sexual partners that they need testing and treatment. Do not have sexual intercourse until 7 days after completion of medication.  Return to ER for worsening or new symptoms, pelvic pain, fevers, chills.

## 2018-08-13 NOTE — ED Notes (Signed)
This RN has contacted the lab again about results.

## 2018-08-14 LAB — GC/CHLAMYDIA PROBE AMP (~~LOC~~) NOT AT ARMC
CHLAMYDIA, DNA PROBE: NEGATIVE
NEISSERIA GONORRHEA: NEGATIVE

## 2018-11-24 ENCOUNTER — Encounter (HOSPITAL_COMMUNITY): Payer: Self-pay | Admitting: Emergency Medicine

## 2018-11-24 ENCOUNTER — Ambulatory Visit (HOSPITAL_COMMUNITY)
Admission: EM | Admit: 2018-11-24 | Discharge: 2018-11-24 | Disposition: A | Discharge status: Home / Self Care | Payer: Medicaid Other | Attending: Family Medicine | Admitting: Family Medicine

## 2018-11-24 DIAGNOSIS — Z113 Encounter for screening for infections with a predominantly sexual mode of transmission: Secondary | ICD-10-CM | POA: Insufficient documentation

## 2018-11-24 DIAGNOSIS — N898 Other specified noninflammatory disorders of vagina: Secondary | ICD-10-CM | POA: Insufficient documentation

## 2018-11-24 MED ORDER — CEFTRIAXONE SODIUM 250 MG IJ SOLR
250.0000 mg | Freq: Once | INTRAMUSCULAR | Status: AC
  Administered 2018-11-24: 250 mg via INTRAMUSCULAR

## 2018-11-24 MED ORDER — AZITHROMYCIN 250 MG PO TABS
1000.0000 mg | ORAL_TABLET | Freq: Once | ORAL | Status: AC
  Administered 2018-11-24: 14:00:00 1000 mg via ORAL

## 2018-11-24 MED ORDER — AZITHROMYCIN 250 MG PO TABS
ORAL_TABLET | ORAL | Status: AC
  Filled 2018-11-24: qty 4

## 2018-11-24 MED ORDER — METRONIDAZOLE 500 MG PO TABS: 500.0000 mg | ORAL_TABLET | Freq: Two times a day (BID) | ORAL | 0 refills | Status: DC

## 2018-11-24 MED ORDER — FLUCONAZOLE 150 MG PO TABS: 150.0000 mg | ORAL_TABLET | Freq: Every day | ORAL | 0 refills | Status: DC

## 2018-11-24 MED ORDER — CEFTRIAXONE SODIUM 250 MG IJ SOLR
INTRAMUSCULAR | Status: AC
  Filled 2018-11-24: qty 250

## 2018-11-24 MED ORDER — LIDOCAINE HCL (PF) 1 % IJ SOLN
INTRAMUSCULAR | Status: AC
  Filled 2018-11-24: qty 2

## 2018-11-24 NOTE — ED Triage Notes (Signed)
Pt c/o vaginal burning for several weeks, requesting std testing.

## 2018-11-24 NOTE — Discharge Instructions (Signed)
We are screening for STDs. Treating prophylactically for bacterial vaginosis and yeast infection based on symptoms. Also treated prophylactically for gonorrhea and chlamydia We will call you with any positive results

## 2018-11-25 LAB — CERVICOVAGINAL ANCILLARY ONLY
Bacterial vaginitis: POSITIVE — AB
Candida vaginitis: NEGATIVE
Chlamydia: NEGATIVE
Neisseria Gonorrhea: NEGATIVE
Trichomonas: POSITIVE — AB

## 2018-11-25 LAB — RPR: RPR Ser Ql: NONREACTIVE

## 2018-11-25 LAB — HIV ANTIBODY (ROUTINE TESTING W REFLEX): HIV Screen 4th Generation wRfx: NONREACTIVE
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
## 2019-07-10 DIAGNOSIS — R1084 Generalized abdominal pain: Secondary | ICD-10-CM

## 2019-07-10 NOTE — ED Provider Notes (Signed)
EMERGENCY DEPARTMENT HISTORY AND PHYSICAL EXAM      Date: 07/10/2019  Patient Name: Katelyn Burke    History of Presenting Illness     Chief Complaint   Patient presents with   ??? Abdominal Pain   ??? Vomiting   ??? Alcohol intoxication       History Provided By: Patient and EMS    HPI: Crystallynn Szczesniak, 24 y.o. female   presents to the ED with cc of vomiting and abdominal pain.  Patient planes of generalized abdominal pain with multiple episode of vomiting.  Patient states that she has been drinking EtOH this morning along with smoking marijuana.  Patient was brought in by EMS for multiple vomiting.  Patient describe domino pain is cramping has intermittent worse and relieved with after vomiting.  No GI bleed.  No dysuria hematuria.  No fever chills.  No cough.  No shortness of breath.      PCP: None    No current facility-administered medications on file prior to encounter.      No current outpatient medications on file prior to encounter.       Past History     Past Medical History:  History reviewed. No pertinent past medical history.    Past Surgical History:  History reviewed. No pertinent surgical history.    Family History:  History reviewed. No pertinent family history.    Social History:  Social History     Tobacco Use   ??? Smoking status: Current Every Day Smoker     Packs/day: 0.25   ??? Smokeless tobacco: Never Used   Substance Use Topics   ??? Alcohol use: Yes     Comment: 2 days per week   ??? Drug use: Yes     Types: Marijuana     Comment: 2 days per week       Allergies:  No Known Allergies      Review of Systems   Review of Systems   Constitutional: Negative for activity change, appetite change, chills and fever.   HENT: Negative for sore throat.    Eyes: Negative for discharge.   Respiratory: Negative for shortness of breath.    Cardiovascular: Negative for chest pain.   Gastrointestinal: Positive for abdominal pain and nausea.   Endocrine: Negative for polyuria.    Genitourinary: Negative for difficulty urinating.   Musculoskeletal: Negative for arthralgias.   Skin: Negative for rash.   Neurological: Negative for headaches.   Hematological: Negative for adenopathy.   Psychiatric/Behavioral: Negative for dysphoric mood.   All other systems reviewed and are negative.      Physical Exam   Physical Exam  Vitals signs and nursing note reviewed.   Constitutional:       Appearance: Normal appearance.   HENT:      Head: Normocephalic and atraumatic.      Nose: Nose normal.      Mouth/Throat:      Mouth: Mucous membranes are moist.      Pharynx: Oropharynx is clear.   Eyes:      Conjunctiva/sclera: Conjunctivae normal.   Neck:      Musculoskeletal: Neck supple.   Cardiovascular:      Rate and Rhythm: Normal rate and regular rhythm.      Heart sounds: Normal heart sounds.   Pulmonary:      Effort: Pulmonary effort is normal.      Breath sounds: Normal breath sounds.   Abdominal:      General: Abdomen is  flat. Bowel sounds are normal.      Palpations: Abdomen is soft.      Tenderness: There is no abdominal tenderness.      Hernia: No hernia is present.   Musculoskeletal:      Right lower leg: No edema.      Left lower leg: No edema.   Skin:     General: Skin is warm and dry.   Neurological:      General: No focal deficit present.      Mental Status: She is alert and oriented to person, place, and time.   Psychiatric:         Mood and Affect: Mood normal.         Diagnostic Study Results     Labs -     Recent Results (from the past 12 hour(s))   CBC WITH AUTOMATED DIFF    Collection Time: 07/10/19  9:15 PM   Result Value Ref Range    WBC 9.9 3.6 - 11.0 K/uL    RBC 4.21 3.80 - 5.20 M/uL    HGB 11.9 11.5 - 16.0 g/dL    HCT 36.7 35.0 - 47.0 %    MCV 87.2 80.0 - 99.0 FL    MCH 28.3 26.0 - 34.0 PG    MCHC 32.4 30.0 - 36.5 g/dL    RDW 16.6 (H) 11.5 - 14.5 %    PLATELET 198 150 - 400 K/uL    MPV 11.1 8.9 - 12.9 FL    NEUTROPHILS 84 (H) 32 - 75 %    LYMPHOCYTES 10 (L) 12 - 49 %     MONOCYTES 5 5 - 13 %    EOSINOPHILS 1 0 - 7 %    BASOPHILS 0 0 - 1 %    IMMATURE GRANULOCYTES 0 0.0 - 0.5 %    ABS. NEUTROPHILS 8.3 (H) 1.8 - 8.0 K/UL    ABS. LYMPHOCYTES 1.0 0.8 - 3.5 K/UL    ABS. MONOCYTES 0.5 0.0 - 1.0 K/UL    ABS. EOSINOPHILS 0.1 0.0 - 0.4 K/UL    ABS. BASOPHILS 0.0 0.0 - 0.1 K/UL    ABS. IMM. GRANS. 0.0 0.00 - 0.04 K/UL    DF AUTOMATED     METABOLIC PANEL, COMPREHENSIVE    Collection Time: 07/10/19  9:15 PM   Result Value Ref Range    Sodium 143 136 - 145 mmol/L    Potassium 3.8 3.5 - 5.1 mmol/L    Chloride 107 97 - 108 mmol/L    CO2 27 21 - 32 mmol/L    Anion gap 9 5 - 15 mmol/L    Glucose 100 65 - 100 mg/dL    BUN 8 6 - 20 mg/dL    Creatinine 0.83 0.55 - 1.02 mg/dL    BUN/Creatinine ratio 10 (L) 12 - 20      GFR est AA >60 >60 ml/min/1.26m    GFR est non-AA >60 >60 ml/min/1.749m   Calcium 8.4 (L) 8.5 - 10.1 mg/dL    Bilirubin, total 0.3 0.2 - 1.0 mg/dL    AST (SGOT) 22 15 - 37 U/L    ALT (SGPT) 19 12 - 78 U/L    Alk. phosphatase 62 45 - 117 U/L    Protein, total 7.1 6.4 - 8.2 g/dL    Albumin 3.6 3.5 - 5.0 g/dL    Globulin 3.5 2.0 - 4.0 g/dL    A-G Ratio 1.0 (L) 1.1 - 2.2     HCG  QL SERUM    Collection Time: 07/10/19  9:15 PM   Result Value Ref Range    HCG, Ql. Negative Negative     LIPASE    Collection Time: 07/10/19  9:32 PM   Result Value Ref Range    Lipase 42 (L) 73 - 393 U/L   ETHYL ALCOHOL    Collection Time: 07/10/19 10:04 PM   Result Value Ref Range    ALCOHOL(ETHYL),SERUM <4 <10 mg/dL       Radiologic Studies -   No orders to display     CT Results  (Last 48 hours)    None        CXR Results  (Last 48 hours)    None            Medical Decision Making   I am the first provider for this patient.    I reviewed the vital signs, available nursing notes, past medical history, past surgical history, family history and social history.    Vital Signs-Reviewed the patient's vital signs.  Patient Vitals for the past 12 hrs:   Temp Pulse Resp BP SpO2    07/10/19 2121 98.2 ??F (36.8 ??C) 63 18 127/84 100 %       Records Reviewed:     Provider Notes (Medical Decision Making):       ED Course:   Initial assessment performed. The patients presenting problems have been discussed, and they are in agreement with the care plan formulated and outlined with them.  I have encouraged them to ask questions as they arise throughout their visit.       No active vomiting abdomen soft nontender    PROCEDURES      Disposition: Condition stable   DC- Adult Discharges: All of the diagnostic tests were reviewed and questions answered. Diagnosis, care plan and treatment options were discussed.  understand instructions and will follow up as directed. The patients results have been reviewed with them.  They have been counseled regarding their diagnosis.  The patient verbally convey understanding and agreement of the signs, symptoms, diagnosis, treatment and prognosis and additionally agrees to follow up as recommended.  They also agree with the care-plan and convey that all of their questions have been answered.  I have also put together some discharge instructions for them that include: 1) educational information regarding their diagnosis, 2) how to care for their diagnosis at home, as well a 3) list of reasons why they would want to return to the ED prior to their follow-up appointment, should their condition change.  PLAN:  1.   Current Discharge Medication List      START taking these medications    Details   ondansetron (Zofran ODT) 4 mg disintegrating tablet Take 1 Tab by mouth every eight (8) hours as needed for Nausea, Vomiting or Nausea or Vomiting.  Qty: 12 Tab, Refills: 0           2.   Follow-up Information     Follow up With Specialties Details Why Contact Info    Follow up with your primary care physician  Schedule an appointment as soon as possible for a visit in 3 days As needed         Return to ED if worse     Diagnosis     Clinical Impression:   1. Abdominal cramping     2. Non-intractable vomiting with nausea, unspecified vomiting type        Please note that  this dictation was completed with Dragon, the computer voice recognition software.  Quite often unanticipated grammatical, syntax, homophones, and other interpretive errors are inadvertently transcribed by the computer software.  Please disregard these errors.  Please excuse any errors that have escaped final proofreading.  Thank you.

## 2019-07-10 NOTE — ED Triage Notes (Signed)
EMS reports pt c/o N/V and abdominal pain since 1230, +ETOH; pt reports drinking 3 malt liquor drinks and 4shots of liquor this morning    EMS started #20LAC; administered Zofran 4 mg and started NS bolus

## 2019-07-10 NOTE — ED Notes (Signed)
EMS reports pt c/o N/V and abdominal pain since 1230, +ETOH; pt reports drinking 3 malt liquor drinks and 4shots of liquor this morning    EMS started #20LAC; administered Zofran 4 mg and started NS bolus

## 2019-07-10 NOTE — ED Provider Notes (Signed)
ED  Provider Notes by Levada Schilling, MD at 07/10/19 2237                Author: Levada Schilling, MD  Service: --  Author Type: Physician       Filed: 07/10/19 2239  Date of Service: 07/10/19 2237  Status: Signed          Editor: Levada Schilling, MD (Physician)               EMERGENCY DEPARTMENT HISTORY AND PHYSICAL EXAM           Date: 07/10/2019   Patient Name: Katelyn Burke        History of Presenting Illness          Chief Complaint       Patient presents with        ?  Abdominal Pain     ?  Vomiting        ?  Alcohol intoxication           History Provided By: Patient and EMS      HPI: Katelyn Burke,  24 y.o. female   presents to the ED with cc of vomiting and abdominal pain.  Patient planes  of generalized abdominal pain with multiple episode of vomiting.  Patient states that she has been drinking EtOH this morning along with smoking marijuana.  Patient was brought in by EMS for multiple vomiting.  Patient describe domino pain is cramping  has intermittent worse and relieved with after vomiting.  No GI bleed.  No dysuria hematuria.  No fever chills.  No cough.  No shortness of breath.         PCP: None        No current facility-administered medications on file prior to encounter.           No current outpatient medications on file prior to encounter.             Past History        Past Medical History:   History reviewed. No pertinent past medical history.      Past Surgical History:   History reviewed. No pertinent surgical history.      Family History:   History reviewed. No pertinent family history.      Social History:     Social History          Tobacco Use         ?  Smoking status:  Current Every Day Smoker              Packs/day:  0.25         ?  Smokeless tobacco:  Never Used       Substance Use Topics         ?  Alcohol use:  Yes             Comment: 2 days per week         ?  Drug use:  Yes              Types:  Marijuana             Comment: 2 days per week           Allergies:   No Known Allergies            Review of Systems     Review of Systems    Constitutional: Negative for activity change, appetite change,  chills and fever.    HENT: Negative for sore throat.     Eyes: Negative for discharge.    Respiratory: Negative for shortness of breath.     Cardiovascular: Negative for chest pain.    Gastrointestinal: Positive for abdominal pain and nausea .    Endocrine: Negative for polyuria.    Genitourinary: Negative for difficulty urinating.    Musculoskeletal: Negative for arthralgias.    Skin: Negative for rash.    Neurological: Negative for headaches.    Hematological: Negative for adenopathy.    Psychiatric/Behavioral: Negative for dysphoric mood.    All other systems reviewed and are negative.           Physical Exam     Physical Exam   Vitals signs and nursing note reviewed.   Constitutional:        Appearance: Normal appearance.   HENT :       Head: Normocephalic and atraumatic.      Nose: Nose normal.      Mouth/Throat:      Mouth: Mucous membranes are moist.      Pharynx: Oropharynx is clear.   Eyes:       Conjunctiva/sclera: Conjunctivae normal.   Neck :       Musculoskeletal: Neck supple.   Cardiovascular :       Rate and Rhythm: Normal rate and regular rhythm.      Heart sounds: Normal heart sounds.    Pulmonary:       Effort: Pulmonary effort is normal.      Breath sounds: Normal breath sounds.   Abdominal :      General: Abdomen is flat. Bowel sounds are normal.      Palpations: Abdomen is soft.      Tenderness: There is no abdominal tenderness.      Hernia: No hernia is present.     Musculoskeletal:      Right lower leg: No edema.      Left lower leg: No edema.    Skin:      General: Skin is warm and dry.   Neurological :       General: No focal deficit present.      Mental Status: She is alert and oriented to person, place, and time.    Psychiatric:         Mood and Affect: Mood normal.               Diagnostic Study Results        Labs -         Recent Results (from the past 12 hour(s))     CBC  WITH AUTOMATED DIFF          Collection Time: 07/10/19  9:15 PM         Result  Value  Ref Range            WBC  9.9  3.6 - 11.0 K/uL       RBC  4.21  3.80 - 5.20 M/uL       HGB  11.9  11.5 - 16.0 g/dL       HCT  36.7  35.0 - 47.0 %       MCV  87.2  80.0 - 99.0 FL       MCH  28.3  26.0 - 34.0 PG       MCHC  32.4  30.0 - 36.5 g/dL       RDW  16.6 (  H)  11.5 - 14.5 %       PLATELET  198  150 - 400 K/uL       MPV  11.1  8.9 - 12.9 FL       NEUTROPHILS  84 (H)  32 - 75 %       LYMPHOCYTES  10 (L)  12 - 49 %       MONOCYTES  5  5 - 13 %       EOSINOPHILS  1  0 - 7 %       BASOPHILS  0  0 - 1 %       IMMATURE GRANULOCYTES  0  0.0 - 0.5 %       ABS. NEUTROPHILS  8.3 (H)  1.8 - 8.0 K/UL       ABS. LYMPHOCYTES  1.0  0.8 - 3.5 K/UL       ABS. MONOCYTES  0.5  0.0 - 1.0 K/UL       ABS. EOSINOPHILS  0.1  0.0 - 0.4 K/UL       ABS. BASOPHILS  0.0  0.0 - 0.1 K/UL       ABS. IMM. GRANS.  0.0  0.00 - 0.04 K/UL       DF  AUTOMATED          METABOLIC PANEL, COMPREHENSIVE          Collection Time: 07/10/19  9:15 PM         Result  Value  Ref Range            Sodium  143  136 - 145 mmol/L       Potassium  3.8  3.5 - 5.1 mmol/L       Chloride  107  97 - 108 mmol/L       CO2  27  21 - 32 mmol/L       Anion gap  9  5 - 15 mmol/L       Glucose  100  65 - 100 mg/dL       BUN  8  6 - 20 mg/dL       Creatinine  0.83  0.55 - 1.02 mg/dL       BUN/Creatinine ratio  10 (L)  12 - 20         GFR est AA  >60  >60 ml/min/1.14m       GFR est non-AA  >60  >60 ml/min/1.763m      Calcium  8.4 (L)  8.5 - 10.1 mg/dL       Bilirubin, total  0.3  0.2 - 1.0 mg/dL       AST (SGOT)  22  15 - 37 U/L       ALT (SGPT)  19  12 - 78 U/L       Alk. phosphatase  62  45 - 117 U/L       Protein, total  7.1  6.4 - 8.2 g/dL       Albumin  3.6  3.5 - 5.0 g/dL       Globulin  3.5  2.0 - 4.0 g/dL       A-G Ratio  1.0 (L)  1.1 - 2.2         HCG QL SERUM          Collection Time: 07/10/19  9:15 PM         Result  Value  Ref Range  HCG, Ql.  Negative  Negative          LIPASE          Collection Time: 07/10/19  9:32 PM         Result  Value  Ref Range            Lipase  42 (L)  73 - 393 U/L       ETHYL ALCOHOL          Collection Time: 07/10/19 10:04 PM         Result  Value  Ref Range            ALCOHOL(ETHYL),SERUM  <4  <10 mg/dL           Radiologic Studies -      No orders to display          CT Results   (Last 48 hours)          None                 CXR Results   (Last 48 hours)          None                       Medical Decision Making     I am the first provider for this patient.      I reviewed the vital signs, available nursing notes, past medical history, past surgical history, family history and social history.      Vital Signs-Reviewed the patient's vital signs.   Patient Vitals for the past 12 hrs:            Temp  Pulse  Resp  BP  SpO2            07/10/19 2121  98.2 ??F (36.8 ??C)  63  18  127/84  100 %           Records Reviewed:       Provider Notes (Medical Decision Making):          ED Course:    Initial assessment performed. The patients presenting problems have been discussed, and they are in agreement with the care plan formulated and outlined with them.  I have encouraged them to ask questions as they arise throughout their visit.          No active vomiting abdomen soft nontender      PROCEDURES         Disposition: Condition stable    DC- Adult Discharges: All of the diagnostic tests were reviewed and questions answered. Diagnosis, care plan and treatment options were discussed.  understand instructions and will follow up as directed. The patients results have been reviewed with  them.  They have been counseled regarding their diagnosis.  The patient verbally convey understanding and agreement of the signs, symptoms, diagnosis, treatment and prognosis and additionally agrees to follow up as recommended.  They also agree with  the care-plan and convey that all of their questions have been answered.  I have also put together some discharge instructions for  them that include: 1) educational information regarding their diagnosis, 2) how to care for their diagnosis at home, as  well a 3) list of reasons why they would want to return to the ED prior to their follow-up appointment, should their condition change.   PLAN:   1.      Current Discharge Medication List  START taking these medications          Details        ondansetron (Zofran ODT) 4 mg disintegrating tablet  Take 1 Tab by mouth every eight (8) hours as needed for Nausea, Vomiting or Nausea or Vomiting.   Qty: 12 Tab, Refills:  0                      2.      Follow-up Information               Follow up With  Specialties  Details  Why  Contact Info              Follow up with your primary care physician    Schedule an appointment as soon as possible for a visit in 3 days  As needed               Return to ED if worse         Diagnosis        Clinical Impression:       1.  Abdominal cramping         2.  Non-intractable vomiting with nausea, unspecified vomiting type            Please note that this dictation was completed with Dragon, the computer voice recognition software.  Quite often unanticipated grammatical, syntax, homophones, and other interpretive  errors are inadvertently transcribed by the computer software.  Please disregard these errors.  Please excuse any errors that have escaped final proofreading.  Thank you.

## 2019-07-11 ENCOUNTER — Inpatient Hospital Stay
Admit: 2019-07-11 | Discharge: 2019-07-11 | Disposition: A | Discharge status: Home / Self Care | Attending: Emergency Medicine

## 2019-07-11 LAB — CBC WITH AUTO DIFFERENTIAL
Basophils %: 0 % (ref 0–1)
Basophils Absolute: 0 10*3/uL (ref 0.0–0.1)
Eosinophils %: 1 % (ref 0–7)
Eosinophils Absolute: 0.1 10*3/uL (ref 0.0–0.4)
Granulocyte Absolute Count: 0 10*3/uL (ref 0.00–0.04)
Hematocrit: 36.7 % (ref 35.0–47.0)
Hemoglobin: 11.9 g/dL (ref 11.5–16.0)
Immature Granulocytes: 0 % (ref 0.0–0.5)
Lymphocytes %: 10 % — ABNORMAL LOW (ref 12–49)
Lymphocytes Absolute: 1 10*3/uL (ref 0.8–3.5)
MCH: 28.3 PG (ref 26.0–34.0)
MCHC: 32.4 g/dL (ref 30.0–36.5)
MCV: 87.2 FL (ref 80.0–99.0)
MPV: 11.1 FL (ref 8.9–12.9)
Monocytes %: 5 % (ref 5–13)
Monocytes Absolute: 0.5 10*3/uL (ref 0.0–1.0)
Neutrophils %: 84 % — ABNORMAL HIGH (ref 32–75)
Neutrophils Absolute: 8.3 10*3/uL — ABNORMAL HIGH (ref 1.8–8.0)
Platelets: 198 10*3/uL (ref 150–400)
RBC: 4.21 M/uL (ref 3.80–5.20)
RDW: 16.6 % — ABNORMAL HIGH (ref 11.5–14.5)
WBC: 9.9 10*3/uL (ref 3.6–11.0)

## 2019-07-11 LAB — COMPREHENSIVE METABOLIC PANEL
ALT: 19 U/L (ref 12–78)
AST: 22 U/L (ref 15–37)
Albumin/Globulin Ratio: 1 — ABNORMAL LOW (ref 1.1–2.2)
Albumin: 3.6 g/dL (ref 3.5–5.0)
Alkaline Phosphatase: 62 U/L (ref 45–117)
Anion Gap: 9 mmol/L (ref 5–15)
BUN: 8 mg/dL (ref 6–20)
Bun/Cre Ratio: 10 — ABNORMAL LOW (ref 12–20)
CO2: 27 mmol/L (ref 21–32)
Calcium: 8.4 mg/dL — ABNORMAL LOW (ref 8.5–10.1)
Chloride: 107 mmol/L (ref 97–108)
Creatinine: 0.83 mg/dL (ref 0.55–1.02)
EGFR IF NonAfrican American: >60 mL/min/{1.73_m2} (ref >60)
GFR African American: >60 mL/min/{1.73_m2} (ref >60)
Globulin: 3.5 g/dL (ref 2.0–4.0)
Glucose: 100 mg/dL (ref 65–100)
Potassium: 3.8 mmol/L (ref 3.5–5.1)
Sodium: 143 mmol/L (ref 136–145)
Total Bilirubin: 0.3 mg/dL (ref 0.2–1.0)
Total Protein: 7.1 g/dL (ref 6.4–8.2)

## 2019-07-11 LAB — LIPASE
Lipase: 42 U/L — ABNORMAL LOW (ref 73–393)
Lipase: 42 U/L — ABNORMAL LOW (ref 73–393)

## 2019-07-11 LAB — ETHYL ALCOHOL
ALCOHOL(ETHYL),SERUM: <4 mg/dL (ref <10)
Ethyl Alcohol: <4 mg/dL (ref <10)

## 2019-07-11 LAB — HCG QL SERUM
HCG(Serum) Pregnancy Test: NEGATIVE
HCG, Ql.: NEGATIVE

## 2019-07-11 LAB — CBC WITH AUTOMATED DIFF
ABS. BASOPHILS: 0 10*3/uL (ref 0.0–0.1)
ABS. EOSINOPHILS: 0.1 10*3/uL (ref 0.0–0.4)
ABS. IMM. GRANS.: 0 10*3/uL (ref 0.00–0.04)
ABS. LYMPHOCYTES: 1 10*3/uL (ref 0.8–3.5)
ABS. MONOCYTES: 0.5 10*3/uL (ref 0.0–1.0)
ABS. NEUTROPHILS: 8.3 10*3/uL — ABNORMAL HIGH (ref 1.8–8.0)
BASOPHILS: 0 % (ref 0–1)
EOSINOPHILS: 1 % (ref 0–7)
HCT: 36.7 % (ref 35.0–47.0)
HGB: 11.9 g/dL (ref 11.5–16.0)
IMMATURE GRANULOCYTES: 0 % (ref 0.0–0.5)
LYMPHOCYTES: 10 % — ABNORMAL LOW (ref 12–49)
MCH: 28.3 PG (ref 26.0–34.0)
MCHC: 32.4 g/dL (ref 30.0–36.5)
MCV: 87.2 FL (ref 80.0–99.0)
MONOCYTES: 5 % (ref 5–13)
MPV: 11.1 FL (ref 8.9–12.9)
NEUTROPHILS: 84 % — ABNORMAL HIGH (ref 32–75)
PLATELET: 198 10*3/uL (ref 150–400)
RBC: 4.21 M/uL (ref 3.80–5.20)
RDW: 16.6 % — ABNORMAL HIGH (ref 11.5–14.5)
WBC: 9.9 10*3/uL (ref 3.6–11.0)

## 2019-07-11 LAB — METABOLIC PANEL, COMPREHENSIVE
A-G Ratio: 1 — ABNORMAL LOW (ref 1.1–2.2)
ALT (SGPT): 19 U/L (ref 12–78)
AST (SGOT): 22 U/L (ref 15–37)
Albumin: 3.6 g/dL (ref 3.5–5.0)
Alk. phosphatase: 62 U/L (ref 45–117)
Anion gap: 9 mmol/L (ref 5–15)
BUN/Creatinine ratio: 10 — ABNORMAL LOW (ref 12–20)
BUN: 8 mg/dL (ref 6–20)
Bilirubin, total: 0.3 mg/dL (ref 0.2–1.0)
CO2: 27 mmol/L (ref 21–32)
Calcium: 8.4 mg/dL — ABNORMAL LOW (ref 8.5–10.1)
Chloride: 107 mmol/L (ref 97–108)
Creatinine: 0.83 mg/dL (ref 0.55–1.02)
GFR est AA: >60 mL/min/{1.73_m2} (ref >60)
GFR est non-AA: >60 mL/min/{1.73_m2} (ref >60)
Globulin: 3.5 g/dL (ref 2.0–4.0)
Glucose: 100 mg/dL (ref 65–100)
Potassium: 3.8 mmol/L (ref 3.5–5.1)
Protein, total: 7.1 g/dL (ref 6.4–8.2)
Sodium: 143 mmol/L (ref 136–145)

## 2019-07-11 MED ORDER — ONDANSETRON 4 MG TAB, RAPID DISSOLVE
4 mg | ORAL_TABLET | Freq: Three times a day (TID) | ORAL | 0 refills | Status: AC | PRN
Start: 2019-07-11 — End: ?

## 2019-07-11 MED ORDER — ONDANSETRON (PF) 4 MG/2 ML INJECTION
4 mg/2 mL | INTRAMUSCULAR | Status: AC
Start: 2019-07-11 — End: 2019-07-10
  Administered 2019-07-11: 03:00:00 via INTRAVENOUS

## 2019-07-11 MED ORDER — HALOPERIDOL LACTATE 5 MG/ML IJ SOLN
5 mg/mL | Freq: Once | INTRAMUSCULAR | Status: AC
Start: 2019-07-11 — End: 2019-07-10
  Administered 2019-07-11: 03:00:00 via INTRAVENOUS

## 2019-07-11 MED ORDER — SODIUM CHLORIDE 0.9 % IV
Freq: Once | INTRAVENOUS | Status: AC
Start: 2019-07-11 — End: 2019-07-10
  Administered 2019-07-11: 03:00:00 via INTRAVENOUS

## 2019-07-11 MED FILL — ONDANSETRON (PF) 4 MG/2 ML INJECTION: 4 mg/2 mL | INTRAMUSCULAR | Qty: 2

## 2019-07-11 MED FILL — HALOPERIDOL LACTATE 5 MG/ML IJ SOLN: 5 mg/mL | INTRAMUSCULAR | Qty: 1

## 2019-07-11 MED FILL — SODIUM CHLORIDE 0.9 % IV: INTRAVENOUS | Qty: 1000

## 2019-09-17 ENCOUNTER — Emergency Department (HOSPITAL_COMMUNITY)
Admission: EM | Admit: 2019-09-17 | Discharge: 2019-09-17 | Discharge status: Against Medical Advice | Payer: Medicaid Other | Attending: Emergency Medicine | Admitting: Emergency Medicine

## 2019-09-17 ENCOUNTER — Other Ambulatory Visit: Payer: Self-pay

## 2019-09-17 ENCOUNTER — Encounter (HOSPITAL_COMMUNITY): Payer: Self-pay

## 2019-09-17 DIAGNOSIS — F1910 Other psychoactive substance abuse, uncomplicated: Secondary | ICD-10-CM | POA: Diagnosis present

## 2019-09-17 DIAGNOSIS — Z5321 Procedure and treatment not carried out due to patient leaving prior to being seen by health care provider: Secondary | ICD-10-CM | POA: Insufficient documentation

## 2019-09-17 LAB — RAPID URINE DRUG SCREEN, HOSP PERFORMED
Amphetamines: NOT DETECTED
Barbiturates: NOT DETECTED
Benzodiazepines: POSITIVE — AB
Cocaine: NOT DETECTED
Opiates: NOT DETECTED
Tetrahydrocannabinol: POSITIVE — AB

## 2019-09-17 NOTE — ED Notes (Signed)
Writer and Kendal Hymen, EMT searched lobby and bathroom for patient, patient not seen.

## 2019-09-17 NOTE — ED Notes (Signed)
Patient called for blood work x1 in triage; patient did not answer when called from the lobby.

## 2019-09-17 NOTE — ED Notes (Signed)
Pt not seen in lobby when called for labs x2

## 2019-09-17 NOTE — ED Notes (Signed)
Patient not in waiting room for blood draw.

## 2019-09-17 NOTE — ED Triage Notes (Addendum)
Arrived by EMS, sent by [facility] for monitoring and to detox from "every drug". EMS states that [facility] reports patient is positive for everything. Patient states she went to [facility] for Detox. Patient reports she last used cocaine, marijuana, and Ectasy 3 days ago.

## 2019-09-17 NOTE — ED Notes (Signed)
From [facility] because of positive drug test results and they want "medical observation." No complaints. Not IVC'd.

## 2019-10-22 ENCOUNTER — Emergency Department (HOSPITAL_COMMUNITY)
Admission: EM | Admit: 2019-10-22 | Discharge: 2019-10-22 | Discharge status: Against Medical Advice | Payer: Medicaid Other

## 2019-10-22 NOTE — ED Notes (Signed)
Called for pt twice with no response for vital signs and triage.

## 2019-12-16 ENCOUNTER — Telehealth: Payer: Medicaid Other

## 2019-12-16 ENCOUNTER — Other Ambulatory Visit: Payer: Self-pay

## 2020-03-12 ENCOUNTER — Ambulatory Visit (HOSPITAL_COMMUNITY)
Admission: EM | Admit: 2020-03-12 | Discharge: 2020-03-12 | Disposition: A | Discharge status: Home / Self Care | Payer: Medicaid Other | Attending: Urgent Care | Admitting: Urgent Care

## 2020-03-12 ENCOUNTER — Other Ambulatory Visit: Payer: Self-pay

## 2020-03-12 ENCOUNTER — Encounter (HOSPITAL_COMMUNITY): Payer: Self-pay | Admitting: Emergency Medicine

## 2020-03-12 DIAGNOSIS — Z79899 Other long term (current) drug therapy: Secondary | ICD-10-CM | POA: Diagnosis not present

## 2020-03-12 DIAGNOSIS — S3992XS Unspecified injury of lower back, sequela: Secondary | ICD-10-CM | POA: Diagnosis not present

## 2020-03-12 DIAGNOSIS — Z8781 Personal history of (healed) traumatic fracture: Secondary | ICD-10-CM | POA: Insufficient documentation

## 2020-03-12 DIAGNOSIS — Z751 Person awaiting admission to adequate facility elsewhere: Secondary | ICD-10-CM | POA: Insufficient documentation

## 2020-03-12 DIAGNOSIS — M545 Low back pain, unspecified: Secondary | ICD-10-CM

## 2020-03-12 DIAGNOSIS — Z20822 Contact with and (suspected) exposure to covid-19: Secondary | ICD-10-CM | POA: Diagnosis not present

## 2020-03-12 DIAGNOSIS — F17210 Nicotine dependence, cigarettes, uncomplicated: Secondary | ICD-10-CM | POA: Insufficient documentation

## 2020-03-12 DIAGNOSIS — M79621 Pain in right upper arm: Secondary | ICD-10-CM | POA: Diagnosis not present

## 2020-03-12 LAB — SARS CORONAVIRUS 2 (TAT 6-24 HRS): SARS Coronavirus 2: NEGATIVE

## 2020-03-12 MED ORDER — NAPROXEN 500 MG PO TABS: 500.0000 mg | ORAL_TABLET | Freq: Two times a day (BID) | ORAL | 0 refills | Status: DC

## 2020-03-12 MED ORDER — HYDROXYZINE HCL 25 MG PO TABS
12.5000 mg | ORAL_TABLET | Freq: Three times a day (TID) | ORAL | 0 refills | Status: DC | PRN
Start: 2020-03-12 — End: 2022-01-31

## 2020-03-12 MED ORDER — TIZANIDINE HCL 4 MG PO TABS: 4.0000 mg | ORAL_TABLET | Freq: Three times a day (TID) | ORAL | 0 refills | Status: DC | PRN

## 2020-03-12 NOTE — ED Triage Notes (Signed)
Pt c/o body aches all over and lower mid back pain for a few days. She states she has pins and screws in her arm for the last 6 years and it is painful. She states she used to take gabapentin and cyclobenazaprine Since last week but lately her throat seems itchy and she is not sure if she is having an allergic reaction.
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
## 2020-03-12 NOTE — Discharge Instructions (Addendum)
Take naproxen for pain and inflammation. Use tizanidine for muscle relaxant properties. Use hydroxyzine for itching and allergic reactions. Follow up with your PCP through [facility].

## 2020-08-03 ENCOUNTER — Encounter: Payer: Self-pay | Admitting: Neurology

## 2020-08-07 ENCOUNTER — Encounter: Payer: Self-pay | Admitting: Neurology

## 2020-08-07 ENCOUNTER — Telehealth: Payer: Self-pay | Admitting: Neurology

## 2020-08-07 ENCOUNTER — Ambulatory Visit: Payer: Medicaid Other | Admitting: Neurology

## 2020-08-07 NOTE — Telephone Encounter (Signed)
Pt was a no show for the new patient apt scheduled today

## 2021-07-20 ENCOUNTER — Encounter (HOSPITAL_COMMUNITY): Payer: Self-pay

## 2021-07-20 ENCOUNTER — Other Ambulatory Visit: Payer: Self-pay

## 2021-07-20 ENCOUNTER — Ambulatory Visit (HOSPITAL_COMMUNITY)
Admission: EM | Admit: 2021-07-20 | Discharge: 2021-07-20 | Disposition: A | Discharge status: Home / Self Care | Payer: Medicaid Other | Attending: Student | Admitting: Student

## 2021-07-20 DIAGNOSIS — B349 Viral infection, unspecified: Secondary | ICD-10-CM | POA: Insufficient documentation

## 2021-07-20 DIAGNOSIS — N760 Acute vaginitis: Secondary | ICD-10-CM | POA: Diagnosis present

## 2021-07-20 DIAGNOSIS — Z1152 Encounter for screening for COVID-19: Secondary | ICD-10-CM | POA: Insufficient documentation

## 2021-07-20 LAB — HIV ANTIBODY (ROUTINE TESTING W REFLEX): HIV Screen 4th Generation wRfx: NONREACTIVE

## 2021-07-20 LAB — POC URINE PREG, ED: Preg Test, Ur: NEGATIVE

## 2021-07-20 LAB — SARS CORONAVIRUS 2 (TAT 6-24 HRS): SARS Coronavirus 2: NEGATIVE

## 2021-07-20 NOTE — Discharge Instructions (Signed)
-  We have sent testing for sexually transmitted infections. We will notify you of any positive results once they are received. If required, we will prescribe any medications you might need. Please refrain from all sexual activity until treatment is complete.  -Seek additional medical attention if you develop fevers/chills, new/worsening abdominal pain, new/worsening vaginal discomfort/discharge, etc.  -With a virus, you're typically contagious for 5-7 days, or as long as you're having fevers.  -You can take Tylenol up to 1000 mg 3 times daily, and ibuprofen up to 600 mg 3 times daily with food.  You can take these together, or alternate every 3-4 hours. -You can take Mucinex, Nyquil, etc.
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
## 2021-07-20 NOTE — ED Triage Notes (Signed)
Pt presents with cough & nasal drainage X 4 days; pt also complains of vaginal irritation & vaginal odor X 2 days.

## 2021-07-21 LAB — RPR: RPR Ser Ql: NONREACTIVE

## 2021-07-23 ENCOUNTER — Telehealth (HOSPITAL_COMMUNITY): Payer: Self-pay | Admitting: Emergency Medicine

## 2021-07-23 LAB — CERVICOVAGINAL ANCILLARY ONLY
Bacterial Vaginitis (gardnerella): POSITIVE — AB
Candida Glabrata: NEGATIVE
Candida Vaginitis: NEGATIVE
Chlamydia: NEGATIVE
Comment: NEGATIVE
Comment: NEGATIVE
Comment: NEGATIVE
Comment: NEGATIVE
Comment: NEGATIVE
Comment: NORMAL
Neisseria Gonorrhea: NEGATIVE
Trichomonas: POSITIVE — AB

## 2021-07-23 MED ORDER — METRONIDAZOLE 500 MG PO TABS: 500.0000 mg | ORAL_TABLET | Freq: Two times a day (BID) | ORAL | 0 refills | Status: DC

## 2021-07-26 ENCOUNTER — Emergency Department (HOSPITAL_COMMUNITY): Payer: Medicaid Other

## 2021-07-26 ENCOUNTER — Emergency Department (HOSPITAL_COMMUNITY)
Admission: EM | Admit: 2021-07-26 | Discharge: 2021-07-26 | Disposition: A | Discharge status: Home / Self Care | Payer: Medicaid Other | Attending: Emergency Medicine | Admitting: Emergency Medicine

## 2021-07-26 ENCOUNTER — Encounter (HOSPITAL_COMMUNITY): Payer: Self-pay

## 2021-07-26 ENCOUNTER — Other Ambulatory Visit: Payer: Self-pay

## 2021-07-26 DIAGNOSIS — F17200 Nicotine dependence, unspecified, uncomplicated: Secondary | ICD-10-CM | POA: Diagnosis not present

## 2021-07-26 DIAGNOSIS — R051 Acute cough: Secondary | ICD-10-CM | POA: Diagnosis not present

## 2021-07-26 DIAGNOSIS — A599 Trichomoniasis, unspecified: Secondary | ICD-10-CM | POA: Diagnosis present

## 2021-07-26 DIAGNOSIS — J069 Acute upper respiratory infection, unspecified: Secondary | ICD-10-CM | POA: Diagnosis not present

## 2021-07-26 DIAGNOSIS — Z20822 Contact with and (suspected) exposure to covid-19: Secondary | ICD-10-CM | POA: Insufficient documentation

## 2021-07-26 LAB — CBC WITH DIFFERENTIAL/PLATELET
Abs Immature Granulocytes: 0.09 10*3/uL — ABNORMAL HIGH (ref 0.00–0.07)
Basophils Absolute: 0 10*3/uL (ref 0.0–0.1)
Basophils Relative: 0 %
Eosinophils Absolute: 0 10*3/uL (ref 0.0–0.5)
Eosinophils Relative: 0 %
HCT: 31.9 % — ABNORMAL LOW (ref 36.0–46.0)
Hemoglobin: 10.1 g/dL — ABNORMAL LOW (ref 12.0–15.0)
Immature Granulocytes: 1 %
Lymphocytes Relative: 9 %
Lymphs Abs: 1.2 10*3/uL (ref 0.7–4.0)
MCH: 26.2 pg (ref 26.0–34.0)
MCHC: 31.7 g/dL (ref 30.0–36.0)
MCV: 82.6 fL (ref 80.0–100.0)
Monocytes Absolute: 0.9 10*3/uL (ref 0.1–1.0)
Monocytes Relative: 7 %
Neutro Abs: 11.8 10*3/uL — ABNORMAL HIGH (ref 1.7–7.7)
Neutrophils Relative %: 83 %
Platelets: 319 10*3/uL (ref 150–400)
RBC: 3.86 MIL/uL — ABNORMAL LOW (ref 3.87–5.11)
RDW: 16.3 % — ABNORMAL HIGH (ref 11.5–15.5)
WBC: 14.1 10*3/uL — ABNORMAL HIGH (ref 4.0–10.5)
nRBC: 0 % (ref 0.0–0.2)

## 2021-07-26 LAB — URINALYSIS, ROUTINE W REFLEX MICROSCOPIC
Bilirubin Urine: NEGATIVE
Glucose, UA: NEGATIVE mg/dL
Hgb urine dipstick: NEGATIVE
Ketones, ur: NEGATIVE mg/dL
Nitrite: NEGATIVE
Protein, ur: NEGATIVE mg/dL
Specific Gravity, Urine: 1.008 (ref 1.005–1.030)
pH: 6 (ref 5.0–8.0)

## 2021-07-26 LAB — COMPREHENSIVE METABOLIC PANEL
ALT: 14 U/L (ref 0–44)
AST: 18 U/L (ref 15–41)
Albumin: 3.3 g/dL — ABNORMAL LOW (ref 3.5–5.0)
Alkaline Phosphatase: 54 U/L (ref 38–126)
Anion gap: 8 (ref 5–15)
BUN: 5 mg/dL — ABNORMAL LOW (ref 6–20)
CO2: 25 mmol/L (ref 22–32)
Calcium: 8.7 mg/dL — ABNORMAL LOW (ref 8.9–10.3)
Chloride: 105 mmol/L (ref 98–111)
Creatinine, Ser: 0.75 mg/dL (ref 0.44–1.00)
GFR, Estimated: >60 mL/min (ref >60)
Glucose, Bld: 81 mg/dL (ref 70–99)
Potassium: 3.6 mmol/L (ref 3.5–5.1)
Sodium: 138 mmol/L (ref 135–145)
Total Bilirubin: 0.4 mg/dL (ref 0.3–1.2)
Total Protein: 6.6 g/dL (ref 6.5–8.1)

## 2021-07-26 LAB — LIPASE, BLOOD: Lipase: 24 U/L (ref 11–51)

## 2021-07-26 LAB — I-STAT BETA HCG BLOOD, ED (MC, WL, AP ONLY): I-stat hCG, quantitative: <5 m[IU]/mL (ref <5)

## 2021-07-26 MED ORDER — ACETAMINOPHEN 325 MG PO TABS
650.0000 mg | ORAL_TABLET | Freq: Once | ORAL | Status: AC
  Administered 2021-07-26: 650 mg via ORAL
  Filled 2021-07-26: qty 2

## 2021-07-26 MED ORDER — METRONIDAZOLE 500 MG PO TABS
500.0000 mg | ORAL_TABLET | Freq: Once | ORAL | Status: AC
  Administered 2021-07-26: 500 mg via ORAL
  Filled 2021-07-26: qty 1

## 2021-07-26 MED ORDER — METRONIDAZOLE 500 MG PO TABS: 500.0000 mg | ORAL_TABLET | Freq: Two times a day (BID) | ORAL | 0 refills | Status: DC

## 2021-07-26 NOTE — ED Notes (Signed)
Patient verbalizes understanding of discharge instructions. Prescriptions reviewed. Opportunity for questioning and answers were provided. Armband removed by staff, pt discharged from ED ambulatory. ° °

## 2021-07-26 NOTE — ED Provider Triage Note (Signed)
Emergency Medicine Provider Triage Evaluation Note  Jenna [name] , a 26 y.o. female  was evaluated in triage.  Pt complains of STI treatment onset today.  Patient was evaluated in urgent care on 07/20/2021 for STD work-up, at that time they deferred treatment until results came back.  Patient went on 07/22/2021 to have her prescription picked up however was not at the pharmacy.  Has associated suprapubic abdominal pain, lower back pain, vaginal discharge.  Has not tried medication for symptoms.  Denies fever, chills, nausea, vomiting.  Per patient chart review: Patient has a prescription for Flagyl that was sent into the pharmacy on 07/23/2021.  Review of Systems  Positive: As per HPI above. Negative: Fever, chills  Physical Exam  BP 131/82 (BP Location: Right Arm)    Pulse 100    Temp 100.1 F (37.8 C) (Oral)    Resp 18    Ht 5\' 5"  (1.651 m)    Wt 80.7 kg    SpO2 100%    BMI 29.61 kg/m  Gen:   Awake, no distress   Resp:  Normal effort  MSK:   Moves extremities without difficulty  Other:  Mild suprapubic tenderness to palpation.  Mild lumbar musculature tenderness to palpation.  No overlying skin changes.  Medical Decision Making  Medically screening exam initiated at 7:09 PM.  Appropriate orders placed.  Dejaynes was informed that the remainder of the evaluation will be completed by another provider, this initial triage assessment does not replace that evaluation, and the importance of remaining in the ED until their evaluation is complete.   Satoria Dunlop A, PA-C 07/26/21 [id number]

## 2021-07-26 NOTE — Discharge Instructions (Signed)
As discussed, today's labs and x-ray have been generally reassuring.  You have been provided a prescription for your infection.  In addition, a COVID and influenza test has been sent.  These results should be available by the morning.  Stay well-hydrated, monitor your condition carefully and do not hesitate to return here for concerning changes.

## 2021-07-26 NOTE — ED Provider Notes (Signed)
[facility] EMERGENCY DEPARTMENT Provider Note   CSN: [id number] Arrival date & time: 07/26/21  1812     History  Chief Complaint  Patient presents with   STI tx    Jenna [name] is a 26 y.o. female.  HPI Patient presents with concern of ongoing chills, cough.  In addition, the patient has occasional flank pain.  Notably, the patient was seen, evaluated at our affiliated urgent care.  She states that this occurred 2 days ago, but on chart review this happened 5 days ago.  She was diagnosed with trichomonas, viral URI, has not received her medication regimen for trichomonas yet.  She notes that essentially since that visit she is about the same, not improved.  She is a daily smoker, today, with inability to work secondary to above she presents for evaluation.    Home Medications Prior to Admission medications   Medication Sig Start Date End Date Taking? Authorizing Provider  acetaminophen (TYLENOL) 325 MG tablet Take 650 mg by mouth every 6 (six) hours as needed for mild pain or headache.    [provider]  cyclobenzaprine (FLEXERIL) 10 MG tablet Take 10 mg by mouth 2 (two) times daily as needed. 02/29/20   [provider]  fluconazole (DIFLUCAN) 150 MG tablet Take 1 tablet (150 mg total) by mouth daily. 11/24/18   Dahlia Byes A, NP  gabapentin (NEURONTIN) 100 MG capsule Take by mouth. 02/29/20   [provider]  hydrOXYzine (ATARAX/VISTARIL) 25 MG tablet Take 0.5-1 tablets (12.5-25 mg total) by mouth every 8 (eight) hours as needed for itching. 03/12/20   Wallis Bamberg, PA-C  ibuprofen (ADVIL,MOTRIN) 600 MG tablet Take 1 tablet (600 mg total) by mouth every 6 (six) hours as needed. Patient not taking: Reported on 02/02/2018 01/31/18   Donette Larry, CNM  metroNIDAZOLE (FLAGYL) 500 MG tablet Take 1 tablet (500 mg total) by mouth 2 (two) times daily. 07/23/21   Merrilee Jansky, MD  naproxen (NAPROSYN) 500 MG tablet Take 1 tablet (500 mg total)  by mouth 2 (two) times daily with a meal. 03/12/20   Wallis Bamberg, PA-C  Prenatal MV-Min-FA-Omega-3 (PRENATAL GUMMIES/DHA & FA) 0.4-32.5 MG CHEW Chew 1 tablet by mouth 2 (two) times daily.     [provider]  tiZANidine (ZANAFLEX) 4 MG tablet Take 1 tablet (4 mg total) by mouth every 8 (eight) hours as needed. 03/12/20   Wallis Bamberg, PA-C      Allergies    Cyclobenzaprine and Gabapentin    Review of Systems   Review of Systems  Constitutional:        Per HPI, otherwise negative  HENT:         Per HPI, otherwise negative  Respiratory:         Per HPI, otherwise negative  Cardiovascular:        Per HPI, otherwise negative  Gastrointestinal:  Negative for vomiting.  Endocrine:       Negative aside from HPI  Genitourinary:        Neg aside from HPI   Musculoskeletal:        Per HPI, otherwise negative  Skin: Negative.   Neurological:  Negative for syncope.   Physical Exam Updated Vital Signs BP 140/85    Pulse 97    Temp 100.1 F (37.8 C) (Oral)    Resp 16    Ht 5\' 5"  (1.651 m)    Wt 80.7 kg    SpO2 100%  BMI 29.61 kg/m  Physical Exam Vitals and nursing note reviewed.  Constitutional:      General: She is not in acute distress.    Appearance: She is well-developed.  HENT:     Head: Normocephalic and atraumatic.  Eyes:     Conjunctiva/sclera: Conjunctivae normal.  Cardiovascular:     Rate and Rhythm: Normal rate and regular rhythm.  Pulmonary:     Effort: Pulmonary effort is normal. No respiratory distress.     Breath sounds: Normal breath sounds. No stridor.  Abdominal:     General: There is no distension.     Tenderness: There is no abdominal tenderness. There is no guarding.  Skin:    General: Skin is warm and dry.  Neurological:     Mental Status: She is alert and oriented to person, place, and time.     Cranial Nerves: No cranial nerve deficit.    ED Results / Procedures / Treatments   Labs (all labs ordered are listed, but only abnormal results are  displayed) Labs Reviewed  CBC WITH DIFFERENTIAL/PLATELET - Abnormal; Notable for the following components:      Result Value   WBC 14.1 (*)    RBC 3.86 (*)    Hemoglobin 10.1 (*)    HCT 31.9 (*)    RDW 16.3 (*)    Neutro Abs 11.8 (*)    Abs Immature Granulocytes 0.09 (*)    All other components within normal limits  COMPREHENSIVE METABOLIC PANEL - Abnormal; Notable for the following components:   BUN 5 (*)    Calcium 8.7 (*)    Albumin 3.3 (*)    All other components within normal limits  URINALYSIS, ROUTINE W REFLEX MICROSCOPIC - Abnormal; Notable for the following components:   Color, Urine STRAW (*)    Leukocytes,Ua TRACE (*)    Bacteria, UA RARE (*)    All other components within normal limits  RESP PANEL BY RT-PCR (FLU A&B, COVID) ARPGX2  LIPASE, BLOOD  I-STAT BETA HCG BLOOD, ED ([facility], [facility], [facility] ONLY)    EKG None  Radiology DG Chest Port 1 View  Result Date: 07/26/2021 CLINICAL DATA:  Fever EXAM: PORTABLE CHEST 1 VIEW COMPARISON:  None. FINDINGS: The heart size and mediastinal contours are within normal limits. Both lungs are clear. The visualized skeletal structures are unremarkable. IMPRESSION: No active disease. Electronically Signed   By: Charlett Nose M.D.   On: 07/26/2021 22:32    Procedures Procedures    Medications Ordered in ED Medications  metroNIDAZOLE (FLAGYL) tablet 500 mg (has no administration in time range)  acetaminophen (TYLENOL) tablet 650 mg (650 mg Oral Given 07/26/21 1920)    ED Course/ Medical Decision Making/ A&P                           Medical Decision Making Well-appearing adult female presents with concern for known trichomonas infection, for which she has not started medication as well as ongoing URI-like illness.  Here she is awake, alert, no hypoxia, tachycardia, tachypnea, minimal fever, no evidence for bacteremia, sepsis.  Labs reviewed, reassuring.  Labs from recent urgent care as well as that note reviewed as well, similarly  reassuring as above.  With a nontender abdomen, low suspicion for TOA/PID.  Patient received appropriate medication for her trichomonas, had COVID and influenza test sent, but with no hypoxia, increased work of breathing, this result can be followed as an outpatient.  Given her symptoms for  at least 5 days she is not a candidate for novel therapy for either of these illnesses.  Amount and/or Complexity of Data Reviewed External Data Reviewed: notes. Labs: ordered. Decision-making details documented in ED Course. Radiology: ordered.  Risk Prescription drug management. Risk Details: Smoking likely contributing to the URI-like illness as a determinant of health.  Critical Care Total time providing critical care: < 30 minutes          Final Clinical Impression(s) / ED Diagnoses Final diagnoses:  Trichomonas infection  Acute cough     Gerhard MunchLockwood, Reyonna Haack, MD 07/26/21 2313

## 2021-07-26 NOTE — ED Triage Notes (Signed)
Pt arrived via POV, pt states she was dx w/trich at [facility],but was never given medicine for it there and there wasn't any medicine sent to her pharmacy for it. Pt c/o pelvic pain that radiates to her lower back. Pt states she still has a runny nose, cough and  chills that she was seen at same visit at [facility] for.

## 2021-07-27 LAB — RESP PANEL BY RT-PCR (FLU A&B, COVID) ARPGX2
Influenza A by PCR: NEGATIVE
Influenza B by PCR: NEGATIVE
SARS Coronavirus 2 by RT PCR: NEGATIVE

## 2021-10-15 ENCOUNTER — Emergency Department (HOSPITAL_COMMUNITY)
Admission: EM | Admit: 2021-10-15 | Discharge: 2021-10-16 | Disposition: A | Discharge status: Home / Self Care | Payer: Medicaid Other | Attending: Emergency Medicine | Admitting: Emergency Medicine

## 2021-10-15 DIAGNOSIS — R102 Pelvic and perineal pain: Secondary | ICD-10-CM

## 2021-10-15 DIAGNOSIS — R319 Hematuria, unspecified: Secondary | ICD-10-CM | POA: Insufficient documentation

## 2021-10-15 DIAGNOSIS — F17200 Nicotine dependence, unspecified, uncomplicated: Secondary | ICD-10-CM | POA: Diagnosis not present

## 2021-10-15 DIAGNOSIS — N939 Abnormal uterine and vaginal bleeding, unspecified: Secondary | ICD-10-CM | POA: Diagnosis not present

## 2021-10-15 LAB — URINALYSIS, ROUTINE W REFLEX MICROSCOPIC
Bacteria, UA: NONE SEEN
Bilirubin Urine: NEGATIVE
Glucose, UA: NEGATIVE mg/dL
Ketones, ur: NEGATIVE mg/dL
Nitrite: NEGATIVE
Protein, ur: 30 mg/dL — AB
RBC / HPF: >50 RBC/hpf — ABNORMAL HIGH (ref 0–5)
Specific Gravity, Urine: 1.021 (ref 1.005–1.030)
pH: 6 (ref 5.0–8.0)

## 2021-10-15 LAB — CBC WITH DIFFERENTIAL/PLATELET
Abs Immature Granulocytes: 0.03 10*3/uL (ref 0.00–0.07)
Basophils Absolute: 0 10*3/uL (ref 0.0–0.1)
Basophils Relative: 0 %
Eosinophils Absolute: 0.2 10*3/uL (ref 0.0–0.5)
Eosinophils Relative: 2 %
HCT: 38.4 % (ref 36.0–46.0)
Hemoglobin: 11.5 g/dL — ABNORMAL LOW (ref 12.0–15.0)
Immature Granulocytes: 0 %
Lymphocytes Relative: 27 %
Lymphs Abs: 2.5 10*3/uL (ref 0.7–4.0)
MCH: 25.5 pg — ABNORMAL LOW (ref 26.0–34.0)
MCHC: 29.9 g/dL — ABNORMAL LOW (ref 30.0–36.0)
MCV: 85.1 fL (ref 80.0–100.0)
Monocytes Absolute: 0.6 10*3/uL (ref 0.1–1.0)
Monocytes Relative: 7 %
Neutro Abs: 5.9 10*3/uL (ref 1.7–7.7)
Neutrophils Relative %: 64 %
Platelets: 300 10*3/uL (ref 150–400)
RBC: 4.51 MIL/uL (ref 3.87–5.11)
RDW: 19.2 % — ABNORMAL HIGH (ref 11.5–15.5)
WBC: 9.3 10*3/uL (ref 4.0–10.5)
nRBC: 0 % (ref 0.0–0.2)

## 2021-10-15 LAB — COMPREHENSIVE METABOLIC PANEL
ALT: 21 U/L (ref 0–44)
AST: 26 U/L (ref 15–41)
Albumin: 3.9 g/dL (ref 3.5–5.0)
Alkaline Phosphatase: 55 U/L (ref 38–126)
Anion gap: 8 (ref 5–15)
BUN: 11 mg/dL (ref 6–20)
CO2: 22 mmol/L (ref 22–32)
Calcium: 9 mg/dL (ref 8.9–10.3)
Chloride: 107 mmol/L (ref 98–111)
Creatinine, Ser: 0.83 mg/dL (ref 0.44–1.00)
GFR, Estimated: >60 mL/min (ref >60)
Glucose, Bld: 112 mg/dL — ABNORMAL HIGH (ref 70–99)
Potassium: 4 mmol/L (ref 3.5–5.1)
Sodium: 137 mmol/L (ref 135–145)
Total Bilirubin: 0.7 mg/dL (ref 0.3–1.2)
Total Protein: 7.1 g/dL (ref 6.5–8.1)

## 2021-10-15 LAB — I-STAT BETA HCG BLOOD, ED (MC, WL, AP ONLY): I-stat hCG, quantitative: <5 m[IU]/mL (ref <5)

## 2021-10-15 LAB — LIPASE, BLOOD: Lipase: 27 U/L (ref 11–51)

## 2021-10-15 NOTE — ED Triage Notes (Signed)
EMS stated, pt has a abdominal cramping with vomiting this morning. Did have unprotected sex 2 days ago.  ?

## 2021-10-15 NOTE — ED Triage Notes (Signed)
Pt. Stated, My stomach started locking up last night were I had to wait for it to unlock. The pain is so bad. ?

## 2021-10-15 NOTE — ED Provider Triage Note (Signed)
Emergency Medicine Provider Triage Evaluation Note ? ?Dory Peru Heuer , a 26 y.o. female  was evaluated in triage.  Pt complains of lower abdominal cramping/"locking up" that began last night. Pt reports she is on her menstrual cycle however does not feel like her regular menstrual cycle. EMS had reported she was 2 days late. Pt apparently also just recently had unprotected intercourse. She complains of nausea and vomiting. ? ?Review of Systems  ?Positive: + abd pain, nausea, vomiting, vag bleeding ?Negative: - urinary symptoms ? ?Physical Exam  ?BP (!) 119/94 (BP Location: Left Arm)   Pulse 83   Temp 98 ?F (36.7 ?C)   Resp 18   SpO2 100%  ?Gen:   Awake, no distress   ?Resp:  Normal effort  ?MSK:   Moves extremities without difficulty  ?Other:   ? ?Medical Decision Making  ?Medically screening exam initiated at 2:25 PM.  Appropriate orders placed.  Dory Peru Cina was informed that the remainder of the evaluation will be completed by another provider, this initial triage assessment does not replace that evaluation, and the importance of remaining in the ED until their evaluation is complete. ? ? ?  ?Tanda Rockers, PA-C ?10/15/21 1426 ? ?

## 2021-10-16 ENCOUNTER — Emergency Department (HOSPITAL_COMMUNITY): Payer: Medicaid Other

## 2021-10-16 LAB — WET PREP, GENITAL
Sperm: NONE SEEN
Trich, Wet Prep: NONE SEEN
WBC, Wet Prep HPF POC: >=10 — AB (ref <10)
Yeast Wet Prep HPF POC: NONE SEEN

## 2021-10-16 LAB — GC/CHLAMYDIA PROBE AMP (~~LOC~~) NOT AT ARMC
Chlamydia: NEGATIVE
Comment: NEGATIVE
Comment: NORMAL
Neisseria Gonorrhea: NEGATIVE

## 2021-10-16 MED ORDER — KETOROLAC TROMETHAMINE 60 MG/2ML IM SOLN
30.0000 mg | Freq: Once | INTRAMUSCULAR | Status: AC
  Administered 2021-10-16: 30 mg via INTRAMUSCULAR
  Filled 2021-10-16: qty 2

## 2021-10-16 MED ORDER — FENTANYL CITRATE PF 50 MCG/ML IJ SOSY
50.0000 ug | PREFILLED_SYRINGE | Freq: Once | INTRAMUSCULAR | Status: AC
  Administered 2021-10-16: 50 ug via INTRAMUSCULAR
  Filled 2021-10-16: qty 1
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
## 2021-11-22 ENCOUNTER — Ambulatory Visit: Payer: Medicaid Other | Admitting: Medical

## 2021-11-29 ENCOUNTER — Encounter (HOSPITAL_COMMUNITY): Payer: Self-pay | Admitting: Emergency Medicine

## 2021-11-29 ENCOUNTER — Ambulatory Visit (HOSPITAL_COMMUNITY)
Admission: EM | Admit: 2021-11-29 | Discharge: 2021-11-29 | Disposition: A | Discharge status: Home / Self Care | Payer: Medicaid Other | Attending: Emergency Medicine | Admitting: Emergency Medicine

## 2021-11-29 DIAGNOSIS — N898 Other specified noninflammatory disorders of vagina: Secondary | ICD-10-CM

## 2021-11-29 LAB — POCT URINALYSIS DIPSTICK, ED / UC
Bilirubin Urine: NEGATIVE
Glucose, UA: NEGATIVE mg/dL
Hgb urine dipstick: NEGATIVE
Ketones, ur: NEGATIVE mg/dL
Nitrite: NEGATIVE
Protein, ur: NEGATIVE mg/dL
Specific Gravity, Urine: 1.02 (ref 1.005–1.030)
Urobilinogen, UA: 0.2 mg/dL (ref 0.0–1.0)
pH: 8.5 — ABNORMAL HIGH (ref 5.0–8.0)

## 2021-11-29 LAB — HIV ANTIBODY (ROUTINE TESTING W REFLEX): HIV Screen 4th Generation wRfx: NONREACTIVE

## 2021-11-29 LAB — POC URINE PREG, ED: Preg Test, Ur: NEGATIVE

## 2021-11-29 MED ORDER — METRONIDAZOLE 500 MG PO TABS: 500.0000 mg | ORAL_TABLET | Freq: Two times a day (BID) | ORAL | 0 refills | Status: DC

## 2021-11-29 NOTE — Discharge Instructions (Signed)
Today you are being treated prophylactically for  Bacterial vaginosis   Urinalysis negative for bladder infection  Urine pregnancy negative  Take Metronidazole 500 mg twice a day for 7 days, do not drink alcohol while using medication, this will make you feel sick   Bacterial vaginosis which results from an overgrowth of one on several organisms that are normally present in your vagina. Vaginosis is an inflammation of the vagina that can result in discharge, itching and pain.  Labs pending 2-3 days, you will be contacted if positive for any sti and treatment will be sent to the pharmacy, you will have to return to the clinic if positive for gonorrhea to receive treatment   Please refrain from having sex until labs results, if positive please refrain from having sex until treatment complete and symptoms resolve   If positive for HIV, Syphilis, Chlamydia  gonorrhea or trichomoniasis please notify partner or partners so they may tested as well  Moving forward, it is recommended you use some form of protection against the transmission of sti infections  such as condoms or dental dams with each sexual encounter     In addition: Avoid baths, hot tubs and whirlpool spas.  Don't use scented or harsh soaps Avoid irritants. These include scented tampons and pads. Wipe from front to back after using the toilet. Don't douche. Your vagina doesn't require cleansing other than normal bathing.  Use a condom.  Wear cotton underwear, this fabric absorbs some moisture.
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
## 2021-11-29 NOTE — ED Triage Notes (Signed)
Patient c/o dysuria x 3 days.   Patient endorses mid lower ABD cramping at times.   Patient endorses abnormal smelling discharge with "thick, white circle" discharge.   Patient endorses vaginal itching and burning.   Patients LMP was 10/31/2021.

## 2021-11-30 LAB — CERVICOVAGINAL ANCILLARY ONLY
Bacterial Vaginitis (gardnerella): POSITIVE — AB
Candida Glabrata: NEGATIVE
Candida Vaginitis: NEGATIVE
Chlamydia: NEGATIVE
Comment: NEGATIVE
Comment: NEGATIVE
Comment: NEGATIVE
Comment: NEGATIVE
Comment: NEGATIVE
Comment: NORMAL
Neisseria Gonorrhea: NEGATIVE
Trichomonas: POSITIVE — AB

## 2021-11-30 LAB — RPR: RPR Ser Ql: NONREACTIVE

## 2021-12-04 ENCOUNTER — Telehealth (HOSPITAL_COMMUNITY): Payer: Self-pay | Admitting: Emergency Medicine

## 2021-12-04 NOTE — Telephone Encounter (Signed)
Opened in error

## 2022-01-31 ENCOUNTER — Emergency Department (HOSPITAL_COMMUNITY)
Admission: EM | Admit: 2022-01-31 | Discharge: 2022-02-02 | Disposition: A | Discharge status: Other Acute Inpatient Hospital | Payer: Medicaid Other | Attending: Emergency Medicine | Admitting: Emergency Medicine

## 2022-01-31 ENCOUNTER — Other Ambulatory Visit: Payer: Self-pay

## 2022-01-31 DIAGNOSIS — F329 Major depressive disorder, single episode, unspecified: Secondary | ICD-10-CM | POA: Insufficient documentation

## 2022-01-31 DIAGNOSIS — Z79899 Other long term (current) drug therapy: Secondary | ICD-10-CM | POA: Diagnosis not present

## 2022-01-31 DIAGNOSIS — R45851 Suicidal ideations: Secondary | ICD-10-CM | POA: Insufficient documentation

## 2022-01-31 DIAGNOSIS — Z046 Encounter for general psychiatric examination, requested by authority: Secondary | ICD-10-CM | POA: Diagnosis present

## 2022-01-31 DIAGNOSIS — N9489 Other specified conditions associated with female genital organs and menstrual cycle: Secondary | ICD-10-CM | POA: Insufficient documentation

## 2022-01-31 DIAGNOSIS — Z20822 Contact with and (suspected) exposure to covid-19: Secondary | ICD-10-CM | POA: Insufficient documentation

## 2022-01-31 LAB — RAPID URINE DRUG SCREEN, HOSP PERFORMED
Amphetamines: NOT DETECTED
Barbiturates: NOT DETECTED
Benzodiazepines: NOT DETECTED
Cocaine: POSITIVE — AB
Opiates: NOT DETECTED
Tetrahydrocannabinol: POSITIVE — AB

## 2022-01-31 LAB — CBC WITH DIFFERENTIAL/PLATELET
Abs Immature Granulocytes: 0.04 10*3/uL (ref 0.00–0.07)
Basophils Absolute: 0.1 10*3/uL (ref 0.0–0.1)
Basophils Relative: 0 %
Eosinophils Absolute: 0.5 10*3/uL (ref 0.0–0.5)
Eosinophils Relative: 4 %
HCT: 38.7 % (ref 36.0–46.0)
Hemoglobin: 12.4 g/dL (ref 12.0–15.0)
Immature Granulocytes: 0 %
Lymphocytes Relative: 25 %
Lymphs Abs: 3.3 10*3/uL (ref 0.7–4.0)
MCH: 26.7 pg (ref 26.0–34.0)
MCHC: 32 g/dL (ref 30.0–36.0)
MCV: 83.2 fL (ref 80.0–100.0)
Monocytes Absolute: 0.9 10*3/uL (ref 0.1–1.0)
Monocytes Relative: 7 %
Neutro Abs: 8.2 10*3/uL — ABNORMAL HIGH (ref 1.7–7.7)
Neutrophils Relative %: 64 %
Platelets: 393 10*3/uL (ref 150–400)
RBC: 4.65 MIL/uL (ref 3.87–5.11)
RDW: 18.3 % — ABNORMAL HIGH (ref 11.5–15.5)
WBC: 12.9 10*3/uL — ABNORMAL HIGH (ref 4.0–10.5)
nRBC: 0 % (ref 0.0–0.2)

## 2022-01-31 LAB — COMPREHENSIVE METABOLIC PANEL
ALT: 19 U/L (ref 0–44)
AST: 27 U/L (ref 15–41)
Albumin: 3.8 g/dL (ref 3.5–5.0)
Alkaline Phosphatase: 66 U/L (ref 38–126)
Anion gap: 10 (ref 5–15)
BUN: 15 mg/dL (ref 6–20)
CO2: 23 mmol/L (ref 22–32)
Calcium: 9.4 mg/dL (ref 8.9–10.3)
Chloride: 106 mmol/L (ref 98–111)
Creatinine, Ser: 1.02 mg/dL — ABNORMAL HIGH (ref 0.44–1.00)
GFR, Estimated: >60 mL/min (ref >60)
Glucose, Bld: 90 mg/dL (ref 70–99)
Potassium: 3.9 mmol/L (ref 3.5–5.1)
Sodium: 139 mmol/L (ref 135–145)
Total Bilirubin: 0.4 mg/dL (ref 0.3–1.2)
Total Protein: 6.9 g/dL (ref 6.5–8.1)

## 2022-01-31 LAB — I-STAT BETA HCG BLOOD, ED (MC, WL, AP ONLY): I-stat hCG, quantitative: <5 m[IU]/mL (ref <5)

## 2022-01-31 LAB — SALICYLATE LEVEL: Salicylate Lvl: <7 mg/dL — ABNORMAL LOW (ref 7.0–30.0)

## 2022-01-31 LAB — ACETAMINOPHEN LEVEL: Acetaminophen (Tylenol), Serum: <10 ug/mL — ABNORMAL LOW (ref 10–30)

## 2022-01-31 LAB — ETHANOL: Alcohol, Ethyl (B): <10 mg/dL (ref <10)

## 2022-01-31 NOTE — ED Provider Triage Note (Signed)
Emergency Medicine Provider Triage Evaluation Note  Jenna [name] , a 26 y.o. female  was evaluated in triage.  Pt presents complaining of suicidal thoughts and overdose.  Patient reports that sometime in the last 4 hours she reports taking approximately 7 tablets of Tylenol PM.  She is unsure the strength of these medications.  She denies taking any other medications with this or using any alcohol or drugs.  She reports that she took these because she was feeling suicidal.  Patient reported "I do not have the guts to take more".  Patient states that she is here because she "wants to be mentally evaluated to make sure she does not pass away ".  Denies any focal medical complaints.  Review of Systems  Positive: Suicidal ideation Negative: HI, AVH, chest pain, shortness of breath, abdominal pain, vomiting  Physical Exam  BP 136/76 (BP Location: Right Arm)   Pulse 94   Temp 98.3 F (36.8 C) (Oral)   Resp 18   SpO2 100%  Gen:   Awake, no distress   Resp:  Normal effort  MSK:   Moves extremities without difficulty  Other:    Medical Decision Making  Medically screening exam initiated at 7:57 PM.  Appropriate orders placed.  Jenna [name] was informed that the remainder of the evaluation will be completed by another provider, this initial triage assessment does not replace that evaluation, and the importance of remaining in the ED until their evaluation is complete.  Medical clearance labs ordered.  RN International aid/development worker spoke with poison control they recommended EKG and Tylenol level.  If patient has urine output and bowel sounds and stable vitals then safe to proceed with mental health evaluation.  Patient able to urinate and provide sample here in the ED.   Jenna [name], [place] 01/31/22 2020

## 2022-01-31 NOTE — ED Provider Notes (Signed)
[facility] EMERGENCY DEPARTMENT Provider Note   CSN: [id number] Arrival date & time: 01/31/22  1942     History  Chief Complaint  Patient presents with   Suicidal    Jenna [name] is a 26 y.o. female.  HPI Patient presents after suicide attempt.  Patient reports she has  been having suicidal thoughts with plan to overdose.  She reports taking several tablets of Tylenol PM.  This occurred several hours prior to arrival.  She is not sure of the strength of the medications. Denies any other coingestants.  Denies any other attempt to harm herself.  She has no pain complaints at this time    Home Medications Prior to Admission medications   Medication Sig Start Date End Date Taking? Authorizing Provider  gabapentin (NEURONTIN) 100 MG capsule Take by mouth. 02/29/20   [provider]  Prenatal MV-Min-FA-Omega-3 (PRENATAL GUMMIES/DHA & FA) 0.4-32.5 MG CHEW Chew 1 tablet by mouth 2 (two) times daily.     [provider]      Allergies    Cyclobenzaprine and Gabapentin    Review of Systems   Review of Systems  Constitutional:  Negative for fever.  Psychiatric/Behavioral:  Positive for suicidal ideas.     Physical Exam Updated Vital Signs BP 116/71   Pulse 76   Temp 98.2 F (36.8 C)   Resp 17   SpO2 100%  Physical Exam CONSTITUTIONAL: Well developed/well nourished, no distress HEAD: Normocephalic/atraumatic EYES: EOM ENMT: Mucous membranes moist NECK: supple no meningeal signs CV: S1/S2 noted, no murmurs/rubs/gallops noted LUNGS: Lungs are clear to auscultation bilaterally, no apparent distress ABDOMEN: soft NEURO: Pt is awake/alert/appropriate, moves all extremitiesx4.  No facial droop.   EXTREMITIES:full ROM SKIN: warm, color normal PSYCH: no abnormalities of mood noted, alert and oriented to situation  ED Results / Procedures / Treatments   Labs (all labs ordered are listed, but only abnormal results are displayed) Labs  Reviewed  COMPREHENSIVE METABOLIC PANEL - Abnormal; Notable for the following components:      Result Value   Creatinine, Ser 1.02 (*)    All other components within normal limits  RAPID URINE DRUG SCREEN, HOSP PERFORMED - Abnormal; Notable for the following components:   Cocaine POSITIVE (*)    Tetrahydrocannabinol POSITIVE (*)    All other components within normal limits  CBC WITH DIFFERENTIAL/PLATELET - Abnormal; Notable for the following components:   WBC 12.9 (*)    RDW 18.3 (*)    Neutro Abs 8.2 (*)    All other components within normal limits  ACETAMINOPHEN LEVEL - Abnormal; Notable for the following components:   Acetaminophen (Tylenol), Serum <10 (*)    All other components within normal limits  SALICYLATE LEVEL - Abnormal; Notable for the following components:   Salicylate Lvl <7.0 (*)    All other components within normal limits  ACETAMINOPHEN LEVEL - Abnormal; Notable for the following components:   Acetaminophen (Tylenol), Serum <10 (*)    All other components within normal limits  RESP PANEL BY RT-PCR (FLU A&B, COVID) ARPGX2  ETHANOL  I-STAT BETA HCG BLOOD, ED ([facility], [facility], [facility] ONLY)    EKG EKG Interpretation  Date/Time:  Thursday January 31 2022 20:06:25 EDT Ventricular Rate:  95 PR Interval:  128 QRS Duration: 76 QT Interval:  344 QTC Calculation: 432 R Axis:   87 Text Interpretation: Normal sinus rhythm Normal ECG Interpretation limited secondary to artifact Confirmed by Zadie Rhine ([id number]) on 01/31/2022 11:29:54 PM  Radiology  No results found.  Procedures Procedures    Medications Ordered in ED Medications - No data to display  ED Course/ Medical Decision Making/ A&P Clinical Course as of 02/01/22 0055  Thu Jan 31, 2022  2335 WBC(!): 12.9 Leukocytosis [DW]  Fri Feb 01, 2022  0055 Repeat Tylenol level is negative.  Patient is medically stable at this time resting comfortably [DW]    Clinical Course User Index [DW] Zadie Rhine, MD                            Medical Decision Making Amount and/or Complexity of Data Reviewed Labs: ordered. Decision-making details documented in ED Course.   Patient presents after suicide attempt several hours ago.  She reports taking several tablets of Tylenol PM.  Initial labs are reassuring.  We will recheck Tylenol level   The patient has been placed in psychiatric observation due to the need to provide a safe environment for the patient while obtaining psychiatric consultation and evaluation, as well as ongoing medical and medication management to treat the patient's condition.  The patient has not been placed under full IVC at this time.        Final Clinical Impression(s) / ED Diagnoses Final diagnoses:  Suicidal ideation    Rx / DC Orders ED Discharge Orders     None         Zadie Rhine, MD 02/01/22 [phone]

## 2022-01-31 NOTE — ED Triage Notes (Signed)
Pt reports taking tylenol PM in the past 4 hours in an attempt to harm herself. States she took about 7 tablets, "I don't have the guts to take more"  "want to mentally evaluated to be sure I don't pass away"  Pt took the medications she states as she was feeling "suicidal"

## 2022-01-31 NOTE — ED Notes (Signed)
Spoke with Merdis Delay at poison control.  Advises tylenol level, EKG, monitor if pt has positive urine output and bowel sounds and stable VS then proceed with mental health eval.  Pt has urinated and given a sample and does have positive bowel sounds.

## 2022-02-01 LAB — SARS CORONAVIRUS 2 BY RT PCR: SARS Coronavirus 2 by RT PCR: NEGATIVE

## 2022-02-01 LAB — ACETAMINOPHEN LEVEL: Acetaminophen (Tylenol), Serum: <10 ug/mL — ABNORMAL LOW (ref 10–30)

## 2022-02-01 MED ORDER — LORAZEPAM 1 MG PO TABS
1.0000 mg | ORAL_TABLET | Freq: Once | ORAL | Status: AC
  Administered 2022-02-01: 1 mg via ORAL
  Filled 2022-02-01: qty 1

## 2022-02-01 MED ORDER — HYDROXYZINE HCL 25 MG PO TABS
25.0000 mg | ORAL_TABLET | Freq: Three times a day (TID) | ORAL | Status: DC
  Administered 2022-02-01: 25 mg via ORAL
  Filled 2022-02-01: qty 1

## 2022-02-01 MED ORDER — ESCITALOPRAM OXALATE 10 MG PO TABS: 5.0000 mg | ORAL_TABLET | Freq: Every day | ORAL | Status: DC

## 2022-02-01 MED ORDER — LORAZEPAM 0.5 MG PO TABS
0.5000 mg | ORAL_TABLET | Freq: Three times a day (TID) | ORAL | Status: DC | PRN
Start: 2022-02-01 — End: 2022-02-01

## 2022-02-01 NOTE — ED Provider Notes (Signed)
Admitted to [facility] with Dr. Mason Jim.   Jenna Norfolk, DO 02/01/22 2255

## 2022-02-01 NOTE — ED Notes (Signed)
Pt asleep at this time

## 2022-02-01 NOTE — ED Notes (Signed)
Patient out to ask if she is being kept. Informed her I didn't see a note that stated what their plans were. Would let her know when I was informed.

## 2022-02-01 NOTE — ED Notes (Signed)
Pt has 1 belongings bag that was placed in locker 8. Pt's valuables given to security.

## 2022-02-01 NOTE — ED Notes (Signed)
Patient attempted to leave the unit. Able to talk her into going back to her room. Patient has now been IVC'd, is awaiting placement.

## 2022-02-01 NOTE — ED Notes (Signed)
Pt speaking with TTS 

## 2022-02-01 NOTE — ED Notes (Signed)
IVC papers faxed to [facility].  
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
## 2022-02-01 NOTE — ED Notes (Signed)
Pt asked what her plan is - advised that I am unsure of full details, but I think she may be leaving for [facility] at some point. Pt then yelled, "y'all don't know nothing! All y'all do is babysit around here! All I want is my stuff, and my phone and y'all are keeping that from me!", then proceeded to slam the door. Will attempt to contact [facility] staff at this time for further plan.

## 2022-02-01 NOTE — Progress Notes (Signed)
Patient has been denied by [facility] due to no appropriate beds available. Patient meets BH inpatient criteria per Sindy Guadeloupe, NP. Patient has been faxed out to the following facilities:   [facility]  [address] [place] [place] [address] [phone]  [facility]  [address] [place] [address] [phone]  [facility]  [address] [place] [address] [phone]  [facility]  [address], [place] [address] [phone]  [facility]  [address] [place] [address] [phone]  [facility]  [address] [place] [address] [phone]  [facility]  [address] [place], [place] [address] [phone]  [facility]  [address] [place], [place] [address] [phone]  [facility]  [phone] [place] [address] [phone]  [facility]  [address] [place] [address] [phone]  [facility]  [address] [place] [address] [phone]  [facility]  [address] [place] [address] [phone]  [facility]  [address] [place] [address] [phone]   Damita Dunnings, MSW, LCSW-A  9:23 PM 02/01/2022

## 2022-02-02 ENCOUNTER — Other Ambulatory Visit: Payer: Self-pay

## 2022-02-02 ENCOUNTER — Encounter (HOSPITAL_COMMUNITY): Payer: Self-pay | Admitting: Adult Health

## 2022-02-02 ENCOUNTER — Inpatient Hospital Stay (HOSPITAL_COMMUNITY)
Admission: AD | Admit: 2022-02-02 | Discharge: 2022-02-07 | DRG: 885 | Disposition: A | Discharge status: Home / Self Care | Payer: Medicaid Other | Source: Intra-hospital | Attending: Emergency Medicine | Admitting: Emergency Medicine

## 2022-02-02 DIAGNOSIS — F17210 Nicotine dependence, cigarettes, uncomplicated: Secondary | ICD-10-CM | POA: Diagnosis present

## 2022-02-02 DIAGNOSIS — G629 Polyneuropathy, unspecified: Secondary | ICD-10-CM | POA: Diagnosis present

## 2022-02-02 DIAGNOSIS — F1010 Alcohol abuse, uncomplicated: Secondary | ICD-10-CM | POA: Diagnosis present

## 2022-02-02 DIAGNOSIS — F1410 Cocaine abuse, uncomplicated: Secondary | ICD-10-CM | POA: Diagnosis present

## 2022-02-02 DIAGNOSIS — K0889 Other specified disorders of teeth and supporting structures: Secondary | ICD-10-CM | POA: Diagnosis present

## 2022-02-02 DIAGNOSIS — Z9151 Personal history of suicidal behavior: Secondary | ICD-10-CM | POA: Diagnosis not present

## 2022-02-02 DIAGNOSIS — F332 Major depressive disorder, recurrent severe without psychotic features: Secondary | ICD-10-CM | POA: Diagnosis present

## 2022-02-02 DIAGNOSIS — F410 Panic disorder [episodic paroxysmal anxiety] without agoraphobia: Secondary | ICD-10-CM | POA: Diagnosis present

## 2022-02-02 DIAGNOSIS — R45851 Suicidal ideations: Secondary | ICD-10-CM | POA: Diagnosis present

## 2022-02-02 DIAGNOSIS — Z62810 Personal history of physical and sexual abuse in childhood: Secondary | ICD-10-CM | POA: Diagnosis present

## 2022-02-02 DIAGNOSIS — F909 Attention-deficit hyperactivity disorder, unspecified type: Secondary | ICD-10-CM | POA: Diagnosis present

## 2022-02-02 DIAGNOSIS — G4700 Insomnia, unspecified: Secondary | ICD-10-CM | POA: Diagnosis present

## 2022-02-02 DIAGNOSIS — F411 Generalized anxiety disorder: Secondary | ICD-10-CM | POA: Diagnosis present

## 2022-02-02 DIAGNOSIS — F1210 Cannabis abuse, uncomplicated: Secondary | ICD-10-CM | POA: Diagnosis present

## 2022-02-02 DIAGNOSIS — Z833 Family history of diabetes mellitus: Secondary | ICD-10-CM | POA: Diagnosis not present

## 2022-02-02 DIAGNOSIS — Z20822 Contact with and (suspected) exposure to covid-19: Secondary | ICD-10-CM | POA: Diagnosis present

## 2022-02-02 LAB — TSH: TSH: 2.398 u[IU]/mL (ref 0.350–4.500)

## 2022-02-02 LAB — RESP PANEL BY RT-PCR (FLU A&B, COVID) ARPGX2
Influenza A by PCR: NEGATIVE
Influenza B by PCR: NEGATIVE
SARS Coronavirus 2 by RT PCR: NEGATIVE

## 2022-02-02 MED ORDER — ACETAMINOPHEN 325 MG PO TABS
650.0000 mg | ORAL_TABLET | Freq: Four times a day (QID) | ORAL | Status: DC | PRN
  Administered 2022-02-04 – 2022-02-07 (×9): 650 mg via ORAL
  Filled 2022-02-02 (×9): qty 2

## 2022-02-02 MED ORDER — ESCITALOPRAM OXALATE 5 MG PO TABS
5.0000 mg | ORAL_TABLET | Freq: Every day | ORAL | Status: DC
  Filled 2022-02-02 (×2): qty 1

## 2022-02-02 MED ORDER — LOPERAMIDE HCL 2 MG PO CAPS: 2.0000 mg | ORAL_CAPSULE | ORAL | Status: AC | PRN

## 2022-02-02 MED ORDER — MIRTAZAPINE 15 MG PO TABS
15.0000 mg | ORAL_TABLET | Freq: Every day | ORAL | Status: DC
  Administered 2022-02-02 – 2022-02-06 (×5): 15 mg via ORAL
  Filled 2022-02-02 (×8): qty 1

## 2022-02-02 MED ORDER — HYDROXYZINE HCL 25 MG PO TABS
25.0000 mg | ORAL_TABLET | Freq: Three times a day (TID) | ORAL | Status: DC | PRN
Start: 2022-02-02 — End: 2022-02-02
  Administered 2022-02-02: 25 mg via ORAL
  Filled 2022-02-02: qty 1

## 2022-02-02 MED ORDER — LORAZEPAM 1 MG PO TABS: 1.0000 mg | ORAL_TABLET | Freq: Three times a day (TID) | ORAL | Status: DC | PRN

## 2022-02-02 MED ORDER — ADULT MULTIVITAMIN W/MINERALS CH
1.0000 | ORAL_TABLET | Freq: Every day | ORAL | Status: DC
  Administered 2022-02-02 – 2022-02-07 (×6): 1 via ORAL
  Filled 2022-02-02 (×9): qty 1

## 2022-02-02 MED ORDER — ONDANSETRON 4 MG PO TBDP: 4.0000 mg | ORAL_TABLET | Freq: Four times a day (QID) | ORAL | Status: AC | PRN

## 2022-02-02 MED ORDER — MAGNESIUM HYDROXIDE 400 MG/5ML PO SUSP: 30.0000 mL | Freq: Every day | ORAL | Status: DC | PRN

## 2022-02-02 MED ORDER — LORAZEPAM 1 MG PO TABS
1.0000 mg | ORAL_TABLET | Freq: Four times a day (QID) | ORAL | Status: DC | PRN
  Administered 2022-02-02 – 2022-02-06 (×3): 1 mg via ORAL
  Filled 2022-02-02 (×3): qty 1

## 2022-02-02 MED ORDER — NICOTINE 14 MG/24HR TD PT24
14.0000 mg | MEDICATED_PATCH | Freq: Every day | TRANSDERMAL | Status: DC
  Administered 2022-02-02 – 2022-02-07 (×6): 14 mg via TRANSDERMAL
  Filled 2022-02-02 (×10): qty 1

## 2022-02-02 MED ORDER — HYDROXYZINE HCL 50 MG PO TABS: 50.0000 mg | ORAL_TABLET | Freq: Once | ORAL | Status: DC | PRN

## 2022-02-02 MED ORDER — ALUM & MAG HYDROXIDE-SIMETH 200-200-20 MG/5ML PO SUSP: 30.0000 mL | ORAL | Status: DC | PRN

## 2022-02-02 MED ORDER — THIAMINE HCL 100 MG PO TABS
100.0000 mg | ORAL_TABLET | Freq: Every day | ORAL | Status: DC
  Administered 2022-02-03 – 2022-02-07 (×5): 100 mg via ORAL
  Filled 2022-02-02 (×7): qty 1

## 2022-02-02 MED ORDER — HYDROXYZINE HCL 25 MG PO TABS
25.0000 mg | ORAL_TABLET | Freq: Four times a day (QID) | ORAL | Status: DC | PRN
  Administered 2022-02-02 – 2022-02-03 (×2): 25 mg via ORAL
  Filled 2022-02-02 (×2): qty 1

## 2022-02-02 NOTE — BHH Group Notes (Signed)
Adult Psychoeducational Group Note  Date:  02/02/2022 Time:  8:49 PM  Group Topic/Focus:  Wrap-Up Group:   The focus of this group is to help patients review their daily goal of treatment and discuss progress on daily workbooks.  Participation Level:  Active  Participation Quality:  Appropriate and Attentive  Affect:  Appropriate  Cognitive:  Alert and Appropriate  Insight: Appropriate and Good  Engagement in Group:  Engaged  Modes of Intervention:  Discussion and Education  Additional Comments:  Pt attended and participated in wrap up group this evening and rated their day a 5/10, due to them feeling anxious and uncomfortable being at [facility]. Pt states that their goal while they are here is to be more social and would like to learn more about themselves.   Chrisandra Netters 02/02/2022, 8:49 PM
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
## 2022-02-02 NOTE — Group Note (Signed)
  BHH/[facility] LCSW Group Therapy Note  Date/Time:  02/02/2022 10:00am-11:00am  Type of Therapy and Topic:  Group Therapy:  Stability after Discharge  Participation Level:  Active   Description of Group This process group involved patients discussing what their overall goal is at this time in their life and how they plan to work toward that goal when they get home from the hospital.  The group started with patients sharing their goal and plans, then proceeded with group members identifying with each other.  A discussion ensued about the differences in healthy and unhealthy coping skills and a variety of specific coping skills that might be helpful in specific instances were shared.  Group members shared ideas about making changes when they return home so that they can stay well and in recovery.  These included boundaries, putting oneself first, staying on medications, talking to a therapist, and more  Therapeutic Goals Patient will identify one overall goal Patient will list current ideas for how to go about achieving their goal Patient will participate in generating ideas about healthy self-care options when they return to the community Patient will be supportive of one another and receive support from others Patient will receive affirmations and hope  Summary of Patient Progress:  The patient expressed her goal is "to get emotions under control and be able to maintain life."  The plan currently on how to achieve that goal is to get medicine that she believes she needs because she believes she has undiagnosed Bipolar, has had troubles her whole life.   The patient also stated she wants to "come out of here different."  She cried frequently throughout group and expressed despondency, stating at one point "there is no such thing as stability because something is always going to happen."  CSW and other group members were able to point out that life circumstances are not stable, but our reactions can come  from a place of stability.   Therapeutic Modalities Brief Solution-Focused Therapy Psychoeducation   Ambrose Mantle, LCSW 02/02/2022, 12:00pm

## 2022-02-02 NOTE — Progress Notes (Signed)
Patient appears flat and anxious. Patient denies SI/HI/AVH. Patient complied with morning medication with no reported side effects. Pt reports good sleep. Pt reports that her appetite is "not so good". Pt reports anxiety and depression 7/10. Pt stated she "needs anxiety, depression, and sleep medication". Patient remains safe on Q48min checks and contracts for safety.      02/02/22 0957  Psych Admission Type (Psych Patients Only)  Admission Status Voluntary  Psychosocial Assessment  Patient Complaints Appetite decrease;Anxiety;Depression  Eye Contact Fair  Facial Expression Anxious  Affect Apprehensive  Speech Logical/coherent  Interaction Guarded  Appearance/Hygiene Unremarkable  Behavior Characteristics Anxious  Mood Depressed;Anxious  Thought Process  Coherency WDL  Content WDL  Delusions None reported or observed  Perception WDL  Hallucination None reported or observed  Judgment Impaired  Confusion None  Danger to Self  Current suicidal ideation? Denies  Danger to Others  Danger to Others None reported or observed

## 2022-02-02 NOTE — Plan of Care (Signed)
  Problem: Education: Goal: Knowledge of [place] [facility] Education information/materials will improve Outcome: Progressing Goal: Emotional status will improve Outcome: Progressing Goal: Mental status will improve Outcome: Progressing Goal: Verbalization of understanding the information provided will improve Outcome: Progressing   Problem: Activity: Goal: Interest or engagement in activities will improve Outcome: Progressing Goal: Sleeping patterns will improve Outcome: Progressing   Problem: Coping: Goal: Ability to verbalize frustrations and anger appropriately will improve Outcome: Progressing Goal: Ability to demonstrate self-control will improve Outcome: Progressing   Problem: Health Behavior/Discharge Planning: Goal: Identification of resources available to assist in meeting health care needs will improve Outcome: Progressing Goal: Compliance with treatment plan for underlying cause of condition will improve Outcome: Progressing   Problem: Physical Regulation: Goal: Ability to maintain clinical measurements within normal limits will improve Outcome: Progressing   Problem: Safety: Goal: Periods of time without injury will increase Outcome: Progressing   

## 2022-02-02 NOTE — ED Notes (Signed)
Patient discharged in route to [facility].  All belongings given to [employer].

## 2022-02-02 NOTE — Tx Team (Signed)
Initial Treatment Plan 02/02/2022 6:08 AM Jenna [name] Bodley ZOX:[id number]    PATIENT STRESSORS: Financial difficulties   Medication change or noncompliance   Substance abuse     PATIENT STRENGTHS: Capable of independent living  Motivation for treatment/growth  Supportive family/friends    PATIENT IDENTIFIED PROBLEMS:   DEPRESSION  ANXIETY  INTENTIONAL OVERDOSE  "I need to get on medication"             DISCHARGE CRITERIA:  Improved stabilization in mood, thinking, and/or behavior Motivation to continue treatment in a less acute level of care Need for constant or close observation no longer present Safe-care adequate arrangements made Verbal commitment to aftercare and medication compliance  PRELIMINARY DISCHARGE PLAN: Attend aftercare/continuing care group Outpatient therapy Return to previous living arrangement Return to previous work or school arrangements  PATIENT/FAMILY INVOLVEMENT: This treatment plan has been presented to and reviewed with the patient, Jenna [name],  The patient and family have been given the opportunity to ask questions and make suggestions.  JEHU-APPIAH, Salley Scarlet, RN 02/02/2022, 6:08 AM

## 2022-02-02 NOTE — Progress Notes (Signed)
   02/02/22 2315  Psych Admission Type (Psych Patients Only)  Admission Status Voluntary  Psychosocial Assessment  Patient Complaints Anxiety;Depression  Eye Contact Fair  Facial Expression Flat  Affect Appropriate to circumstance  Speech Logical/coherent  Interaction Assertive  Motor Activity Other (Comment) (WDL)  Appearance/Hygiene Unremarkable  Behavior Characteristics Appropriate to situation  Mood Depressed;Pleasant  Thought Process  Coherency WDL  Content WDL  Delusions None reported or observed  Perception WDL  Hallucination None reported or observed  Judgment Impaired  Confusion None  Danger to Self  Current suicidal ideation? Denies  Danger to Others  Danger to Others None reported or observed

## 2022-02-02 NOTE — Progress Notes (Signed)
Patient ID: Jenna [name], female   DOB: 1995/11/19, 26 y.o.   MRN: [id number] Admission note: Patient is an involuntary admission in no acute distress for  Intentional overdose on 7 tabs on tylenol. Pt stated she has struggled with depression since the birth of her child 5 years ago. Pt stated she has an outpatient provider but not currently on medication.   Pt admitted to unit per protocol, skin assessment and belonging search done. No skin issues noted. Consent signed by pt. Pt educated on therapeutic milieu rules. Pt was introduced to milieu by nursing staff. Fall risk / suicide safety plan explained to the patient. 15 minutes checks started for safety.

## 2022-02-02 NOTE — BHH Counselor (Signed)
Adult Comprehensive Assessment  Patient ID: Jenna [name], female   DOB: Mar 05, 1996, 26 y.o.   MRN: [id number]  Information Source: Information source: Patient   Current Stressors:  Needs for treatment: "I need medication for my anxiety, for sleeping at night because I stay up at night, I need counseling. While here I want to get my emotions under control, I want to get a proper diagnosis. I can't control my emotions, I think I am bi-polar, I want to learn to control my emotions. I would like sleeping pills for night and something for anxiety." Educational / Learning stressors: "It takes me awhile to process thing, I don't have my high school diploma but I do have a job." Employment / Job issues: "I have found some peace at [name], its my only safe place. It helps with a routine, I have been working there for 10 months." Family Relationships: "I love my mom but I have to keep some distance from her, she lies to me, she doesn't treat me like her daughter. She only comes around when she wants something." Financial / Lack of resources (include bankruptcy): "I want to start banking with a different banker to help get a house and car, my sister gave me a referral to the credit union." Housing / Lack of housing: "I live with my sister and two other siblings, it's a lot but we have been making it work. I don't split the rent, it would be helpful to do some financial planning so I don't spend it on the wrong things." Physical health (include injuries & life threatening diseases): "I want to get my blood tested, I have had a lot of sexual partners some unprotected." Social relationships: "I don't consider anyone around me to be a friend, they don't reach out for help in the way I need." Substance abuse: "I use alcohol, marijuana, cocaine sometimes, ecstasy. I take a binge and then sober myself down. This cycle has been going on for years." Bereavement / Loss: "My grandmother passed away awhile  ago."   Living/Environment/Situation:  Living Arrangements: Other relatives Living conditions (as described by patient or guardian): lives with 3 siblings How long has patient lived in current situation?: all her life on and off.  What is atmosphere in current home: Comfortable, Loving, Supportive   Family History:  Marital status: Single Are you sexually active?: Yes What is your sexual orientation?: heterosexual Has your sexual activity been affected by drugs, alcohol, medication, or emotional stress?: n/a  Does patient have children?: Yes How many children?: 11 22 year old     Employment/Work Situation:   Employment situation: Employed at [name] for the last 10 months Patient's job has been impacted by current illness: Yes- pt needs time off when sobering up What is the longest time patient has a held a job?: several months  Where was the patient employed at that time?: [employer]  Has patient ever been in the [name]?: No Has patient ever served in combat?: No Did You Receive Any Psychiatric Treatment/Services While in Equities trader?: No Are There Guns or Other Weapons in Your Home?: No Are These Comptroller?:  (n/a)   Financial Resources:   Financial resources: Support from parents / caregiver, Medicaid Does patient have a Lawyer or guardian?: No   Alcohol/Substance Abuse:   What has been your use of drugs/alcohol within the last 12 months?: "I use alcohol, marijuana, cocaine sometimes, ecstasy. I take a binge and then sober myself down.  This cycle has been going on for years." If attempted suicide, did drugs/alcohol play a role in this?: Yes (pt attempted to overdose on Tylenol medication) Alcohol/Substance Abuse Treatment Hx: Denies past history If yes, describe treatment: n/a  Has alcohol/substance abuse ever caused legal problems?: No   Social Support System:   Patient's Community Support System: Poor Describe Community Support System: few  friends in community, siblings are biggest support.  Type of faith/religion: Jenna [name] How does patient's faith help to cope with current illness?: prayer   Leisure/Recreation:   Leisure and Hobbies: "I love to sing everywhere I go. I have sang at church before."   Strengths/Needs:   What things does the patient do well?: "I don't know right now."  In what areas does patient struggle / problems for patient: Being diagnosed, resuming care for medication management and beginning therapy.    Discharge Plan:   Does patient have access to transportation?: Yes (sister could help, but would like to be able to go on her own). Will patient be returning to same living situation after discharge?: Yes Currently receiving community mental health services:  If no, would patient like referral for services when discharged?: Yes (What county[place] [facility] [place]) Does patient have financial barriers related to discharge medications?: No (medicaid)   Summary/Recommendations:   Summary and Recommendations (to be completed by the evaluator): Patient is 26 yo female presenting to [name] after an attempted overdose on Tylenol. Pt reports living with 3 siblings and keeping a job for the past 10 months at [name]. Pt reports using substances in a cycle of binging for several days (and being out of work) and then sobering up for [name]. Pt's goals are to receive a mental health diagnosis and then to be reconnected with care at [name] for medication management; additionally would like to be connected with a counselor. 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 sister, aunt, sister's kids and another brother and sister), no prior hospitalization vs. a recorded hospitalization in 2018, and having two 26 year old twins vs. 1 child. Recommendations for patient include: crisis  stabilization, therapeutic milieu, encourage group attendance and participation, medication management for mood stabilization, and development of comprehensive mental wellness/sobriety plan. CSW assessing for appropriate referrals.   Aldine Contes LCSWA 02/02/2022

## 2022-02-02 NOTE — H&P (Addendum)
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 patient's condition.    Carlyn ReichertNick Gabrielle, MD PGY2 8/5/20234:41 PM

## 2022-02-02 NOTE — Progress Notes (Signed)
Per Sindy Guadeloupe, NP, patient meets criteria for inpatient treatment. There are no available beds at [facility] today. CSW faxed referrals to the following facilities for review:  [facility]  Pending - No Request Sent N/A [address] [place] [address] [phone] --  [facility]  Pending - No Request Sent N/A [address] [place] [address] [phone] --  [facility]  Pending - No Request Sent N/A [address] [place] [address] [phone] --  [facility]  Pending - No Request Sent N/A [address] [place], [place] [address] [phone] --  [facility]  Pending - No Request Sent N/A [address] [place] [address] [phone] --  [facility]  Pending - No Request Sent N/A [address], [place] [address] [phone] --  [facility]  Pending - No Request Sent N/A [address] [place] [address] [phone] --  [facility]  Pending - No Request Sent N/A [address] [place] [address] [phone] --  [facility]  Pending - No Request Sent N/A [address], [place] [address] [phone] --  [facility]  Pending - No Request Sent N/A [address] [place] [address] [phone] --  [facility]  Pending - No Request Sent N/A [name] [place] [address] [phone] --  [facility]  Pending - No Request Sent N/A [address], [place] [address] [phone] --  [facility]  Pending - No Request Sent N/A [address] [place] [address] [phone] --   TTS will continue to seek bed  placement.  Crissie Reese, MSW, Lenice Pressman Phone: [phone] Disposition/TOC

## 2022-02-03 LAB — SARS CORONAVIRUS 2 BY RT PCR: SARS Coronavirus 2 by RT PCR: NEGATIVE

## 2022-02-03 MED ORDER — HYDROXYZINE HCL 50 MG PO TABS
50.0000 mg | ORAL_TABLET | Freq: Three times a day (TID) | ORAL | Status: DC | PRN
  Administered 2022-02-03 – 2022-02-07 (×10): 50 mg via ORAL
  Filled 2022-02-03 (×11): qty 1

## 2022-02-03 NOTE — Group Note (Signed)
  [facility] LCSW Group Therapy Note  Date/Time:  02/03/2022 10:00AM-11:00AM  Type of Therapy and Topic:  Group Therapy:  Ways to Love Myself and Take Care of Myself  Participation Level:  Active   Description of Group This process group started with group leader playing a song entitled "Love Me More" to facilitate a discussion about the need to love and respect ourselves and prioritize taking care of ourselves, especially after hospital discharge.   There was a discussion about the need for self-love, and patients listed ways in which they can demonstrate self-love.  Patients were then asked to share how they plan to take care of themselves in a better manner when they get home from the hospital.  Group members shared ideas about making changes when they return home so that they can stay well and in recovery.  Two more songs were played including "I Am Enough" and "My Own Hero" which led to further reinforcement of the ideas given.  Patients were emotional and/or supportive of those who became emotional.  Therapeutic Goals Patient will listen and be able to relate to a song about prioritizing themselves through self-love Patient will participate in generating ideas about healthy self-care options when they return to the community Patients will be supportive of one another and receive said support from others   Summary of Patient Progress:  The patient expressed that one way she can demonstrate self-love both during hospital stay and after discharge is by getting more familiar with her own mind and by taking her medicine.   Patient identified some barriers to loving herself including not knowing herself, not believing in herself, and letting others put her down.  During group, patient was frequently tearful.  She had difficulty with recognizing that other patients were talking about deeply personal issues, at times would interrupt and start talking about her own issues as though the person we were just  talking to had not been talking.  She did display some growing insight on the topic, nonetheless.   Therapeutic Modalities Activity Motivational Interviewing Processing   Ambrose Mantle, LCSW 02/03/2022, 12:00pm
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
## 2022-02-03 NOTE — Progress Notes (Signed)
   02/03/22 2258  Psych Admission Type (Psych Patients Only)  Admission Status Voluntary  Psychosocial Assessment  Patient Complaints Anxiety  Eye Contact Fair  Facial Expression Anxious  Affect Apprehensive;Anxious  Speech Logical/coherent  Interaction Assertive  Motor Activity Other (Comment) (wdl)  Appearance/Hygiene Unremarkable  Behavior Characteristics Cooperative;Anxious  Mood Anxious  Thought Process  Coherency WDL  Content WDL  Delusions None reported or observed  Perception WDL  Hallucination None reported or observed  Judgment Impaired  Confusion None  Danger to Self  Current suicidal ideation? Denies  Danger to Others  Danger to Others None reported or observed   D: Patient anxious but cooperative. Pt denies SI/HI. A: Medications administered as prescribed. Support and encouragement provided as needed.  R: Patient remains safe on the unit. Will continue to monitor for safety and stability.

## 2022-02-03 NOTE — Group Note (Signed)
Date:  02/03/2022 Time:  2:10 PM  Group Topic/Focus:  Building Self Esteem:   The Focus of this group is helping patients become aware of the effects of self-esteem on their lives, the things they and others do that enhance or undermine their self-esteem, seeing the relationship between their level of self-esteem and the choices they make and learning ways to enhance self-esteem.    Participation Level:  Minimal  Participation Quality:  Appropriate  Affect:  Appropriate  Cognitive:  Appropriate  Insight: Appropriate  Engagement in Group:  Engaged  Modes of Intervention:  Problem-solving  Additional Comments:     Reymundo Poll 02/03/2022, 2:10 PM

## 2022-02-03 NOTE — BHH Group Notes (Signed)
Patient did not attend group.

## 2022-02-03 NOTE — Progress Notes (Signed)
   02/03/22 1400  Psych Admission Type (Psych Patients Only)  Admission Status Voluntary  Psychosocial Assessment  Patient Complaints Anxiety  Eye Contact Fair  Facial Expression Anxious  Affect Appropriate to circumstance  Speech Logical/coherent  Interaction Assertive  Motor Activity Other (Comment) (steady gait)  Appearance/Hygiene Unremarkable  Behavior Characteristics Cooperative;Appropriate to situation  Mood Pleasant  Thought Process  Coherency WDL  Content WDL  Delusions None reported or observed  Perception WDL  Hallucination None reported or observed  Judgment Impaired  Confusion None  Danger to Self  Current suicidal ideation? Denies  Danger to Others  Danger to Others None reported or observed

## 2022-02-03 NOTE — Progress Notes (Signed)
The patient rated her day as a 10 out of 10 and is grateful for being here in the hospital. The patient learned today to try "different things" ,i.e.. Coming out of her bedroom more often.

## 2022-02-04 ENCOUNTER — Encounter (HOSPITAL_COMMUNITY): Payer: Self-pay

## 2022-02-04 MED ORDER — GABAPENTIN 100 MG PO CAPS: 200.0000 mg | ORAL_CAPSULE | Freq: Three times a day (TID) | ORAL | Status: DC

## 2022-02-04 MED ORDER — BENZTROPINE MESYLATE 1 MG/ML IJ SOLN: 1.0000 mg | Freq: Four times a day (QID) | INTRAMUSCULAR | Status: DC | PRN

## 2022-02-04 MED ORDER — HALOPERIDOL 5 MG PO TABS
5.0000 mg | ORAL_TABLET | Freq: Four times a day (QID) | ORAL | Status: DC | PRN
  Administered 2022-02-04 – 2022-02-05 (×3): 5 mg via ORAL
  Filled 2022-02-04 (×3): qty 1

## 2022-02-04 MED ORDER — BENZOCAINE 10 % MT GEL
Freq: Three times a day (TID) | OROMUCOSAL | Status: DC | PRN
  Filled 2022-02-04: qty 9

## 2022-02-04 MED ORDER — HALOPERIDOL LACTATE 5 MG/ML IJ SOLN: 5.0000 mg | Freq: Four times a day (QID) | INTRAMUSCULAR | Status: DC | PRN

## 2022-02-04 MED ORDER — WHITE PETROLATUM EX OINT
TOPICAL_OINTMENT | CUTANEOUS | Status: AC
  Filled 2022-02-04: qty 10

## 2022-02-04 MED ORDER — BENZTROPINE MESYLATE 1 MG PO TABS
1.0000 mg | ORAL_TABLET | Freq: Four times a day (QID) | ORAL | Status: DC | PRN
  Administered 2022-02-05: 1 mg via ORAL
  Filled 2022-02-04 (×2): qty 1

## 2022-02-04 MED ORDER — LORAZEPAM 2 MG/ML IJ SOLN: 1.0000 mg | Freq: Four times a day (QID) | INTRAMUSCULAR | Status: DC | PRN

## 2022-02-04 MED ORDER — GABAPENTIN 100 MG PO CAPS
100.0000 mg | ORAL_CAPSULE | Freq: Three times a day (TID) | ORAL | Status: DC
  Administered 2022-02-04 – 2022-02-05 (×3): 100 mg via ORAL
  Filled 2022-02-04 (×6): qty 1

## 2022-02-04 NOTE — Group Note (Signed)
Date:  02/04/2022 Time:  9:59 AM      Participation Level:  Active  Participation Quality:  Inattentive  Affect:  Angry  Cognitive:  Appropriate  Insight: Appropriate  Engagement in Group:  Engaged  Modes of Intervention:  Discussion  Additional Comments:  Patient stated her goal was to work on discharge.   Andrey Spearman 02/04/2022, 9:59 AM

## 2022-02-04 NOTE — Progress Notes (Signed)
D:  Patient's self inventory sheet, patient has poor sleep, took sleep medication which did not help her.  Fair appetite, normal energy level, good concentration.  Rated depression and anxiety 10, denied hopeless  Denied withdrawals.  Pain, arms, mouth, teeth, rated pain #10 in past 24 hours.  Goal is "anger".  Plans to write to myself.  Ready for discharge.  No discharge plans.  "I just need my medication and I will be fine." A:  Medications administered per MD orders.  Emotional support and encouragement given to patient throughout the day. R:  Denied SI and HI, contracts for safety.  Denied A/V hallucinations.  Safety maintained with 15 minute checks.   Patient has been upset several times throughout the day concerning her medications.   Has talked to MD throughout the day.

## 2022-02-04 NOTE — Progress Notes (Signed)
Adult Psychoeducational Group Note  Date:  02/04/2022 Time:  8:10 PM  Group Topic/Focus:  Wrap-Up Group:   The focus of this group is to help patients review their daily goal of treatment and discuss progress on daily workbooks.  Participation Level:  Did Not Attend  Participation Quality:   Did Not Attend  Affect:   Did Not Attend  Cognitive:   Did Not Attend  Insight: None  Engagement in Group:   Did Not Attend  Modes of Intervention:   Did Not Attend  Additional Comments:  Pt was encouraged to attend AA group but did not attend.  Felipa Furnace 02/04/2022, 8:10 PM

## 2022-02-04 NOTE — Progress Notes (Signed)
D) Pt received requesting pain medication for perceived jaw, tooth root pain. Pt visible, participating in milieu, pacing and holding L cheek. Pt A & O x4. Pt denies SI, HI, A/ V H, depression, anxiety and rates pain 8/10 at this time at this time. A) Pt encouraged to drink fluids. Pt encouraged to come to staff with needs. Pt encouraged to attend and participate in groups. Pt encouraged to set reachable goals.  R) Pt remained safe on unit, in no acute distress, will continue to assess.      02/04/22 1930  Psych Admission Type (Psych Patients Only)  Admission Status Voluntary  Psychosocial Assessment  Patient Complaints Anxiety;Depression  Eye Contact Fair  Facial Expression Anxious  Affect Angry;Anxious  Speech Pressured  Interaction Assertive  Motor Activity Fidgety;Pacing;Restless (holding face)  Appearance/Hygiene Unremarkable  Behavior Characteristics Anxious  Mood Angry  Thought Process  Coherency WDL  Content WDL  Delusions None reported or observed  Perception WDL  Hallucination None reported or observed  Judgment Impaired  Confusion None  Danger to Self  Current suicidal ideation? Denies  Danger to Others  Danger to Others None reported or observed

## 2022-02-04 NOTE — Group Note (Signed)
Occupational Therapy Group Note   Group Topic:Goal Setting  Group Date: 02/04/2022 Start Time: 1400 End Time: 1445 Facilitators: Ted Mcalpine, OT   Group Description: Group encouraged engagement and participation through discussion focused on goal setting. Group members were introduced to goal-setting using the SMART Goal framework, identifying goals as Specific, Measureable, Acheivable, Relevant, and Time-Bound. Group members took time from group to create their own personal goal reflecting the SMART goal template and shared for review by peers and OT.    Therapeutic Goal(s):  Identify at least one goal that fits the SMART framework    Participation Level: Active and Engaged   Participation Quality: Independent   Behavior: Appropriate   Speech/Thought Process: Directed and Organized   Affect/Mood: Appropriate   Insight: Fair   Judgement: Fair   Individualization: pt was active in their participation of group discussion/activity. New skills  identified  Modes of Intervention: Discussion and Education  Patient Response to Interventions:  Attentive   Plan: Continue to engage patient in OT groups 2 - 3x/week.  02/04/2022  Ted Mcalpine, OT Kerrin Champagne, OT
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
## 2022-02-04 NOTE — Group Note (Signed)
LCSW Group Therapy Note   Group Date: 02/04/2022 Start Time: 1300 End Time: 1400    Type of Therapy and Topic:  Group Therapy - Who Am I?  Participation Level:  Active   Description of Group The focus of this group was to aid patients in self-exploration and awareness. Patients were guided in exploring various factors of oneself to include interests, readiness to change, management of emotions, and individual perception of self. Patients were provided with complementary worksheets exploring hidden talents, ease of asking other for help, music/media preferences, understanding and responding to feelings/emotions, and hope for the future. At group closing, patients were encouraged to adhere to discharge plan to assist in continued self-exploration and understanding.  Therapeutic Goals Patients learned that self-exploration and awareness is an ongoing process Patients identified their individual skills, preferences, and abilities Patients explored their openness to establish and confide in supports Patients explored their readiness for change and progression of mental health   Summary of Patient Progress:  Patient actively engaged in introductory check-in. Patient actively engaged in activity of self-exploration and identification, completing complementary worksheet to assist in discussion. Patient identified various factors ranging from hidden talents, favorite music and movies, trusted individuals, accountability, and individual perceptions of self and hope. Pt engaged in processing thoughts and feelings as well as means of reframing thoughts. Pt proved receptive of alternate group members input and feedback from CSW.   Therapeutic Modalities Cognitive Behavioral Therapy Motivational Interviewing  Tobias Alexander 02/04/2022  6:11 PM

## 2022-02-04 NOTE — BH IP Treatment Plan (Signed)
Interdisciplinary Treatment and Diagnostic Plan Update  02/04/2022 Time of Session: 11:05am Jenna [name] MRN: [id number]  Principal Diagnosis: MDD (major depressive disorder), recurrent episode, severe (HCC)  Secondary Diagnoses: Principal Problem:   MDD (major depressive disorder), recurrent episode, severe (HCC) Active Problems:   Cocaine abuse (HCC)   Alcohol abuse   Cannabis abuse   Current Medications:  Current Facility-Administered Medications  Medication Dose Route Frequency Provider Last Rate Last Admin   acetaminophen (TYLENOL) tablet 650 mg  650 mg Oral Q6H PRN Ophelia Shoulder E, NP   650 mg at 02/04/22 1431   alum & mag hydroxide-simeth (MAALOX/MYLANTA) 200-200-20 MG/5ML suspension 30 mL  30 mL Oral Q4H PRN Ophelia Shoulder E, NP       haloperidol (HALDOL) tablet 5 mg  5 mg Oral Q6H PRN Hill, Shelbie Hutching, MD       And   benztropine (COGENTIN) tablet 1 mg  1 mg Oral Q6H PRN Hill, Shelbie Hutching, MD       haloperidol lactate (HALDOL) injection 5 mg  5 mg Intramuscular Q6H PRN Hill, Shelbie Hutching, MD       And   LORazepam (ATIVAN) injection 1 mg  1 mg Intramuscular Q6H PRN Hill, Shelbie Hutching, MD       And   benztropine mesylate (COGENTIN) injection 1 mg  1 mg Intramuscular Q6H PRN Hill, Shelbie Hutching, MD       gabapentin (NEURONTIN) capsule 100 mg  100 mg Oral TID Lamar Sprinkles, MD   100 mg at 02/04/22 1015   hydrOXYzine (ATARAX) tablet 50 mg  50 mg Oral TID PRN Carlyn Reichert, MD   50 mg at 02/04/22 1432   loperamide (IMODIUM) capsule 2-4 mg  2-4 mg Oral PRN Comer Locket, MD       LORazepam (ATIVAN) tablet 1 mg  1 mg Oral Q6H PRN Carlyn Reichert, MD   1 mg at 02/02/22 2118   magnesium hydroxide (MILK OF MAGNESIA) suspension 30 mL  30 mL Oral Daily PRN Chales Abrahams, NP       mirtazapine (REMERON) tablet 15 mg  15 mg Oral QHS Carlyn Reichert, MD   15 mg at 02/03/22 2111   multivitamin with minerals tablet 1 tablet  1 tablet Oral Daily Comer Locket, MD    1 tablet at 02/04/22 0815   nicotine (NICODERM CQ - dosed in mg/24 hours) patch 14 mg  14 mg Transdermal Daily Mason Jim, Amy E, MD   14 mg at 02/04/22 0816   ondansetron (ZOFRAN-ODT) disintegrating tablet 4 mg  4 mg Oral Q6H PRN Comer Locket, MD       thiamine (VITAMIN B1) tablet 100 mg  100 mg Oral Daily Mason Jim, Amy E, MD   100 mg at 02/04/22 0815   PTA Medications: Medications Prior to Admission  Medication Sig Dispense Refill Last Dose   diphenhydramine-acetaminophen (TYLENOL PM) 25-500 MG TABS tablet Take 2 tablets by mouth at bedtime as needed (sleep).       Patient Stressors: Financial difficulties   Medication change or noncompliance   Substance abuse    Patient Strengths: Capable of independent living  Motivation for treatment/growth  Supportive family/friends   Treatment Modalities: Medication Management, Group therapy, Case management,  1 to 1 session with clinician, Psychoeducation, Recreational therapy.   Physician Treatment Plan for Primary Diagnosis: MDD (major depressive disorder), recurrent episode, severe (HCC) Long Term Goal(s): Improvement in symptoms so as ready for discharge   Short Term Goals: Ability to  maintain clinical measurements within normal limits will improve  Medication Management: Evaluate patient's response, side effects, and tolerance of medication regimen.  Therapeutic Interventions: 1 to 1 sessions, Unit Group sessions and Medication administration.  Evaluation of Outcomes: Progressing  Physician Treatment Plan for Secondary Diagnosis: Principal Problem:   MDD (major depressive disorder), recurrent episode, severe (HCC) Active Problems:   Cocaine abuse (HCC)   Alcohol abuse   Cannabis abuse  Long Term Goal(s): Improvement in symptoms so as ready for discharge   Short Term Goals: Ability to maintain clinical measurements within normal limits will improve     Medication Management: Evaluate patient's response, side effects, and  tolerance of medication regimen.  Therapeutic Interventions: 1 to 1 sessions, Unit Group sessions and Medication administration.  Evaluation of Outcomes: Progressing   RN Treatment Plan for Primary Diagnosis: MDD (major depressive disorder), recurrent episode, severe (HCC) Long Term Goal(s): Knowledge of disease and therapeutic regimen to maintain health will improve  Short Term Goals: Ability to remain free from injury will improve, Ability to verbalize frustration and anger appropriately will improve, Ability to demonstrate self-control, Ability to participate in decision making will improve, Ability to verbalize feelings will improve, Ability to disclose and discuss suicidal ideas, Ability to identify and develop effective coping behaviors will improve, and Compliance with prescribed medications will improve  Medication Management: RN will administer medications as ordered by provider, will assess and evaluate patient's response and provide education to patient for prescribed medication. RN will report any adverse and/or side effects to prescribing provider.  Therapeutic Interventions: 1 on 1 counseling sessions, Psychoeducation, Medication administration, Evaluate responses to treatment, Monitor vital signs and CBGs as ordered, Perform/monitor CIWA, COWS, AIMS and Fall Risk screenings as ordered, Perform wound care treatments as ordered.  Evaluation of Outcomes: Progressing   LCSW Treatment Plan for Primary Diagnosis: MDD (major depressive disorder), recurrent episode, severe (HCC) Long Term Goal(s): Safe transition to appropriate next level of care at discharge, Engage patient in therapeutic group addressing interpersonal concerns.  Short Term Goals: Engage patient in aftercare planning with referrals and resources, Increase social support, Increase ability to appropriately verbalize feelings, Increase emotional regulation, Facilitate acceptance of mental health diagnosis and concerns,  Facilitate patient progression through stages of change regarding substance use diagnoses and concerns, Identify triggers associated with mental health/substance abuse issues, and Increase skills for wellness and recovery  Therapeutic Interventions: Assess for all discharge needs, 1 to 1 time with Social worker, Explore available resources and support systems, Assess for adequacy in community support network, Educate family and significant other(s) on suicide prevention, Complete Psychosocial Assessment, Interpersonal group therapy.  Evaluation of Outcomes: Progressing   Progress in Treatment: Attending groups: Yes. Participating in groups: Yes. Taking medication as prescribed: Yes. Toleration medication: Yes. Family/Significant other contact made: Yes, individual(s) contacted:  Timya Creelman (sister) [phone] Patient understands diagnosis: Yes. Discussing patient identified problems/goals with staff: Yes. Medical problems stabilized or resolved: Yes. Denies suicidal/homicidal ideation: Yes. Issues/concerns per patient self-inventory: No.  New problem(s) identified: No, Describe:  none reported   New Short Term/Long Term Goal(s):   medication stabilization, elimination of SI thoughts, development of comprehensive mental wellness plan.    Patient Goals:  Pt states, I would like to get connected to treatment and treatment with drugs  Discharge Plan or Barriers:  Patient recently admitted. CSW will continue to follow and assess for appropriate referrals and possible discharge planning.    Reason for Continuation of Hospitalization: Anxiety Depression Medication stabilization Suicidal ideation Withdrawal  symptoms  Estimated Length of Stay: 3-7 days  Last 3 [place] Suicide Severity Risk Score: Flowsheet Row ED from 01/31/2022 in [facility] EMERGENCY DEPARTMENT ED from 11/29/2021 in [facility] ED from 10/15/2021 in [facility] EM[facility]ENCY DEPARTMENT  C-SSRS RISK CATEGORY High Risk No Risk No Risk       Last PHQ 2/9 Scores:     No data to display          Scribe for Treatment Team: Beatris Si, LCSW 02/04/2022 3:22 PM

## 2022-02-04 NOTE — [facility] Suicide Risk Assessment (Signed)
[facility] INPATIENT:  Family/Significant Other Suicide Prevention Education  Suicide Prevention Education:  Education Completed; Timya Kotecki [phone] (Sister) has been identified by the patient as the family member/significant other with whom the patient will be residing, and identified as the person(s) who will aid the patient in the event of a mental health crisis (suicidal ideations/suicide attempt).  With written consent from the patient, the family member/significant other has been provided the following suicide prevention education, prior to the and/or following the discharge of the patient.  The suicide prevention education provided includes the following: Suicide risk factors Suicide prevention and interventions National Suicide Hotline telephone number [facility] assessment telephone number [facility] Emergency Assistance 911 [facility] and/or Residential Mobile Crisis Unit telephone number  Request made of family/significant other to: Remove weapons (e.g., guns, rifles, knives), all items previously/currently identified as safety concern.   Remove drugs/medications (over-the-counter, prescriptions, illicit drugs), all items previously/currently identified as a safety concern.  The family member/significant other verbalizes understanding of the suicide prevention education information provided.  The family member/significant other agrees to remove the items of safety concern listed above.  CSW spoke with Mrs. Crompton who states that her sister has been having "anger outbursts at home".  She states that her sister "lashes out at family over events that occurred outside of the home".  She states that her sister is also isolating and often appears depressed.  She states that these symptoms have been getting worse for the past 2 months.  She states that her sister was on medications in 2018 and that these medications were working well but states that her sister stopped  taking them and stopped attending therapy and psychiatry.  Mrs. Rager confirms that her sister can return home after discharge and that there are no firearms or weapons in the home.  CSW completed SPE with Mrs. Cobos.   Metro Kung Axavier Pressley 02/04/2022, 3:42 PM

## 2022-02-04 NOTE — [facility] Group Notes (Signed)
Spiritual care group on grief and loss facilitated by chaplain Dyanne Carrel, [facility]   Group Goal:   Support / Education around grief and loss   Members engage in facilitated group support and psycho-social education.   Group Description:   Following introductions and group rules, group members engaged in facilitated group dialog and support around topic of loss, with particular support around experiences of loss in their lives. Group Identified types of loss (relationships / self / things) and identified patterns, circumstances, and changes that precipitate losses. Reflected on thoughts / feelings around loss, normalized grief responses, and recognized variety in grief experience. Group noted Worden's four tasks of grief in discussion.   Group drew on Adlerian / Rogerian, narrative, MI,   Patient Progress: Jenna [name] came to the last part of group and participated in the group conversation.  She asked important questions about forgiveness within grief and agreed to meet individually with chaplain tomorrow.  She is also interested in therapy referrals.  [address], Bcc Pager, [phone]

## 2022-02-04 NOTE — Plan of Care (Signed)
Nurse discussed coping skills with patient several times throughout the day.

## 2022-02-05 MED ORDER — GABAPENTIN 100 MG PO CAPS
200.0000 mg | ORAL_CAPSULE | Freq: Three times a day (TID) | ORAL | Status: DC
  Administered 2022-02-05 (×2): 200 mg via ORAL
  Filled 2022-02-05 (×6): qty 2

## 2022-02-05 NOTE — Progress Notes (Signed)
Adult Psychoeducational Group Note  Date:  02/05/2022 Time:  9:47 PM  Group Topic/Focus:  Wrap-Up Group:   The focus of this group is to help patients review their daily goal of treatment and discuss progress on daily workbooks.  Participation Level:  Active  Participation Quality:  Appropriate  Affect:  Appropriate  Cognitive:  Appropriate  Insight: Appropriate  Engagement in Group:  Developing/Improving  Modes of Intervention:  Discussion  Additional Comments:  Pt stated her goal for today was to focus on her treatment plan. Pt stated she accomplished her goal today. Pt stated she talked with her doctor and with her social worker about her care today. Pt rated her overall day a 9 out of 10. Pt stated she felt better about herself tonight. Pt stated she was able to attend all meals. Pt stated she took all medications provided today. Pt stated her appetite was pretty good today. Pt rated her sleep last night was pretty good. Pt stated the goal tonight was to get some rest. Pt stated she had some physical pain tonight. Pt stated she had some severe pain in her mouth tonight. Pt rated the severe pain in her mouth a 10 on the pain level scale. Pt nurse was updated on the situation.  Pt deny visual hallucinations and auditory issues tonight. Pt denies thoughts of harming herself or others. Pt stated she would alert staff if anything changed.  Felipa Furnace 02/05/2022, 9:47 PM

## 2022-02-05 NOTE — Group Note (Signed)
Recreation Therapy Group Note   Group Topic:Animal Assisted Therapy   Group Date: 02/05/2022 Start Time: 1425 End Time: 1500 Facilitators: Eliyas Suddreth, Benito Mccreedy, LRT Location: [address] Dayroom  Animal-Assisted Activity (AAA) Program Checklist/Progress Notes Patient Eligibility Criteria Checklist & Daily Group note for Rec Tx Intervention   AAA/T Program Assumption of Risk Form signed by Patient/ or Parent Legal Guardian YES  Patient is free of allergies or severe asthma  YES  Patient reports no fear of animals YES  Patient reports no history of cruelty to animals YES  Patient understands their participation is voluntary YES   Group Description: Patients provided opportunity to interact with trained and credentialed [employer] Therapy dog and the community volunteer/dog handler.    Affect/Mood: N/A   Participation Level: Did not attend    Clinical Observations/Individualized Feedback: Jenna [name] did not participate in AAA programming offered on unit.   Benito Mccreedy Lakelyn Straus, LRT, CTRS 02/05/2022 4:20 PM

## 2022-02-05 NOTE — Progress Notes (Signed)
Patient became very upset because nurse asked for her VS to be taken before she received her meds.  MHT was in process of getting the dinamap.  Patient stated she is going to put me in back of the line because she doesn't want to give me meds.  Nurse asked security guard to come and stand at med window.  Patient would not talk to nurse.  Would not answer questions.  Patient was given prns, vistaril, haldol and tylenol for tooth ache  Patient turned around and left med window without saying a word.  Security guard witnessed.

## 2022-02-05 NOTE — Plan of Care (Signed)
Nurse discussed anxiety, depression and coping skills with patient.  

## 2022-02-05 NOTE — Progress Notes (Signed)
D:  Patient's self inventory sheet, patient has fair sleep, sleep medication helpful.  Fair appetite, normal energy level, good concentration.  Depression, hopeless, anxiety 10/5.  Denied withdrawals.  Denied SI.  Physical problems,  Arms need to be referred to MD.   Goal is get medication in order so she can know what I'm going home with.  Be nice to people I need people in my life.  Ready to discharge asap.  Plans to keep working hard, make plans for life. A:  Medications administered per MD orders.  Emotional support and encouragement given patient. R:  Safety maintained with 15 minute checks.  Denied SI and HI, contracts for safety.  Denied A/V hallucinations.
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
## 2022-02-05 NOTE — Group Note (Signed)
Date:  02/05/2022 Time:  9:23 AM  Group Topic/Focus:  Orientation:   The focus of this group is to educate the patient on the purpose and policies of crisis stabilization and provide a format to answer questions about their admission.  The group details unit policies and expectations of patients while admitted.    Participation Level:  Active  Participation Quality:  Appropriate  Affect:  Appropriate  Cognitive:  Appropriate  Insight: Appropriate  Engagement in Group:  Engaged  Modes of Intervention:  Discussion  Additional Comments:    Jaquita Rector 02/05/2022, 9:23 AM

## 2022-02-05 NOTE — Progress Notes (Signed)
Pt stated he jaw was locking up, pt given PRN PO Cogentin with relief     02/05/22 2300  Psych Admission Type (Psych Patients Only)  Admission Status Voluntary  Psychosocial Assessment  Patient Complaints Anxiety;Depression  Eye Contact Fair  Facial Expression Anxious  Affect Anxious  Speech Logical/coherent  Interaction Assertive  Motor Activity Fidgety  Appearance/Hygiene Unremarkable  Behavior Characteristics Anxious  Mood Anxious  Aggressive Behavior  Effect No apparent injury  Thought Process  Coherency WDL  Content Blaming others  Delusions None reported or observed  Perception WDL  Hallucination None reported or observed  Judgment Impaired  Confusion None  Danger to Self  Current suicidal ideation? Denies

## 2022-02-06 MED ORDER — BENZTROPINE MESYLATE 1 MG PO TABS
1.0000 mg | ORAL_TABLET | Freq: Two times a day (BID) | ORAL | Status: AC
Start: 2022-02-06 — End: 2022-02-07
  Administered 2022-02-06 – 2022-02-07 (×3): 1 mg via ORAL
  Filled 2022-02-06 (×2): qty 1

## 2022-02-06 MED ORDER — GABAPENTIN 300 MG PO CAPS
300.0000 mg | ORAL_CAPSULE | Freq: Three times a day (TID) | ORAL | Status: DC
  Administered 2022-02-06 – 2022-02-07 (×5): 300 mg via ORAL
  Filled 2022-02-06 (×10): qty 1

## 2022-02-06 NOTE — Progress Notes (Signed)
Pt attended NA group
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
## 2022-02-06 NOTE — Progress Notes (Signed)
Patient appears anxious and irritable. Patient denies SI/HI/AVH. Patient complied with morning medication. Pt reports her jaw "locked up" last night and was given PRN cogentin and it requesting more do to fear of her jaw locking up again Patient remains safe on Q4min checks and contracts for safety.      02/06/22 0829  Psych Admission Type (Psych Patients Only)  Admission Status Voluntary  Psychosocial Assessment  Patient Complaints Anxiety;Depression  Eye Contact Fair  Facial Expression Anxious  Affect Anxious  Speech Logical/coherent  Interaction Assertive  Motor Activity Fidgety  Appearance/Hygiene Unremarkable  Behavior Characteristics Anxious  Mood Anxious  Thought Process  Coherency WDL  Content WDL  Delusions None reported or observed  Perception WDL  Hallucination None reported or observed  Judgment Impaired  Confusion None  Danger to Self  Current suicidal ideation? Denies  Danger to Others  Danger to Others None reported or observed

## 2022-02-06 NOTE — Plan of Care (Signed)

## 2022-02-06 NOTE — Group Note (Signed)
[facility] LCSW Group Therapy Note   Group Date: 02/06/2022 Start Time: 1300 End Time: 1400   Type of Therapy and Topic: Group Therapy: Avoiding Self-Sabotaging and Enabling Behaviors  Participation Level: Active  Description of Group:  In this group, patients will learn how to identify obstacles, self-sabotaging and enabling behaviors, as well as: what are they, why do we do them and what needs these behaviors meet. Discuss unhealthy relationships and how to have positive healthy boundaries with those that sabotage and enable. Explore aspects of self-sabotage and enabling in yourself and how to limit these self-destructive behaviors in everyday life.   Therapeutic Goals: 1. Patient will identify one obstacle that relates to self-sabotage and enabling behaviors 2. Patient will identify one personal self-sabotaging or enabling behavior they did prior to admission 3. Patient will state a plan to change the above identified behavior 4. Patient will demonstrate ability to communicate their needs through discussion and/or role play.    Summary of Patient Progress: Patient was present for the entirety of the group session. Patient was an active listener and participated in the topic of discussion, provided helpful advice to others, and added nuance to topic of conversation. Patient shared she intends on using coping skills in order to interrupt negative feedback loop.    Therapeutic Modalities:  Cognitive Behavioral Therapy Person-Centered Therapy Motivational Interviewing    Corky Crafts, [place]

## 2022-02-06 NOTE — BHH Group Notes (Signed)
Patient did not attend group.

## 2022-02-06 NOTE — Progress Notes (Signed)
Pt reports her tongue is "bunching up". She states it improves when she stops talking and isn't bothering her currently since she has stopped talking. Pt remains safe on Q15 min checks and contracts for safety.

## 2022-02-06 NOTE — Progress Notes (Signed)
   02/06/22 2300  Psych Admission Type (Psych Patients Only)  Admission Status Voluntary  Psychosocial Assessment  Patient Complaints Anxiety  Eye Contact Fair  Facial Expression Anxious  Affect Anxious  Speech Logical/coherent  Interaction Assertive  Motor Activity Fidgety  Appearance/Hygiene Unremarkable  Behavior Characteristics Anxious  Mood Anxious  Aggressive Behavior  Effect No apparent injury  Thought Process  Coherency WDL  Content Blaming others  Delusions None reported or observed  Perception WDL  Hallucination None reported or observed  Judgment Impaired  Confusion None  Danger to Self  Current suicidal ideation? Denies

## 2022-02-07 DIAGNOSIS — F1010 Alcohol abuse, uncomplicated: Secondary | ICD-10-CM

## 2022-02-07 DIAGNOSIS — F1410 Cocaine abuse, uncomplicated: Secondary | ICD-10-CM

## 2022-02-07 DIAGNOSIS — F332 Major depressive disorder, recurrent severe without psychotic features: Principal | ICD-10-CM

## 2022-02-07 MED ORDER — BENZTROPINE MESYLATE 1 MG PO TABS: 1.0000 mg | ORAL_TABLET | Freq: Two times a day (BID) | ORAL | 0 refills | Status: DC | PRN

## 2022-02-07 MED ORDER — MELATONIN 5 MG PO TABS: 5.0000 mg | ORAL_TABLET | Freq: Every evening | ORAL | Status: DC | PRN

## 2022-02-07 MED ORDER — NICOTINE 14 MG/24HR TD PT24: 14.0000 mg | MEDICATED_PATCH | Freq: Every day | TRANSDERMAL | 0 refills | Status: DC

## 2022-02-07 MED ORDER — HYDROXYZINE HCL 50 MG PO TABS: 50.0000 mg | ORAL_TABLET | Freq: Three times a day (TID) | ORAL | 0 refills | Status: AC | PRN

## 2022-02-07 MED ORDER — GABAPENTIN 300 MG PO CAPS: 300.0000 mg | ORAL_CAPSULE | Freq: Three times a day (TID) | ORAL | 0 refills | Status: DC

## 2022-02-07 MED ORDER — BENZTROPINE MESYLATE 1 MG PO TABS
1.0000 mg | ORAL_TABLET | Freq: Two times a day (BID) | ORAL | Status: DC | PRN
Start: 2022-02-07 — End: 2022-02-07

## 2022-02-07 MED ORDER — MIRTAZAPINE 15 MG PO TABS: 15.0000 mg | ORAL_TABLET | Freq: Every day | ORAL | 0 refills | Status: DC

## 2022-02-07 NOTE — Progress Notes (Signed)
Pt complaining of not being able to sleep, pt restless and pacing the unit

## 2022-02-07 NOTE — [facility] Group Notes (Signed)
Adult Psychoeducational Group Note  Date:  02/07/2022 Time:  9:47 AM  Group Topic/Focus:  Goals Group:   The focus of this group is to help patients establish daily goals to achieve during treatment and discuss how the patient can incorporate goal setting into their daily lives to aide in recovery.  Participation Level:  Did Not Attend   Additional Comments:  Pt left the dayroom when it was announced that we would be having group  Jenna [name] 02/07/2022, 9:47 AM

## 2022-02-07 NOTE — Progress Notes (Signed)
  [facility] Adult Case Management Discharge Plan :  Will you be returning to the same living situation after discharge:  Yes,  Home  At discharge, do you have transportation home?: Yes,  Sister Do you have the ability to pay for your medications: Yes,  Medicaid   Release of information consent forms completed and in the chart;  Patient's signature needed at discharge.  Patient to Follow up at:  Follow-up Information     [facility] [employer]. Go to.   Specialty: Professional Counselor Why: Please go to this provider for therapy and medication management services during walk in hours for new patients:  Monday through Friday, from 9 am to 1 pm.  Please inquire about financial planning with this provider as well. Contact information: [employer] [facility] [employer] [address] [place] [address] [phone]         [facility]. Go on 02/13/2022.   Specialty: Behavioral Health Why: You have an appointment on 02/13/22 at 2:00 pm for substance/chemical abuse intensive outpatient therapy services.  The appointment will be held in person.  It is an abstinence based, 12 step program that runs for 3 to 4 weeks. Contact information: [address] [place] [address] [phone]                Next level of care provider has access to [facility] Link:yes  Safety Planning and Suicide Prevention discussed: Yes,  with patient and sister      Has patient been referred to the Quitline?: Patient refused referral  Patient has been referred for addiction treatment: Yes, [facility] for CDIOP and/or [facility] for SA groups.   Aram Beecham, LCSWA 02/07/2022, 10:16 AM

## 2022-02-07 NOTE — Progress Notes (Signed)
Pt was educated on discharge. Pt was given discharge papers. Pt was discharged to lobby. 
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
## 2022-02-07 NOTE — Progress Notes (Signed)
Patient stated needs more cogentin, does not want jaw to lock up again.

## 2022-02-13 ENCOUNTER — Ambulatory Visit (HOSPITAL_COMMUNITY): Payer: Medicaid Other | Admitting: Licensed Clinical Social Worker

## 2022-04-01 ENCOUNTER — Ambulatory Visit (HOSPITAL_COMMUNITY)
Admission: EM | Admit: 2022-04-01 | Discharge: 2022-04-01 | Disposition: A | Discharge status: Home / Self Care | Payer: Medicaid Other | Attending: Family Medicine | Admitting: Family Medicine

## 2022-04-01 ENCOUNTER — Encounter (HOSPITAL_COMMUNITY): Payer: Self-pay | Admitting: *Deleted

## 2022-04-01 ENCOUNTER — Other Ambulatory Visit: Payer: Self-pay

## 2022-04-01 DIAGNOSIS — R1030 Lower abdominal pain, unspecified: Secondary | ICD-10-CM | POA: Diagnosis present

## 2022-04-01 DIAGNOSIS — N760 Acute vaginitis: Secondary | ICD-10-CM | POA: Diagnosis present

## 2022-04-01 LAB — POCT URINALYSIS DIPSTICK, ED / UC
Bilirubin Urine: NEGATIVE
Glucose, UA: NEGATIVE mg/dL
Hgb urine dipstick: NEGATIVE
Ketones, ur: NEGATIVE mg/dL
Leukocytes,Ua: NEGATIVE
Nitrite: NEGATIVE
Protein, ur: NEGATIVE mg/dL
Specific Gravity, Urine: 1.025 (ref 1.005–1.030)
Urobilinogen, UA: 0.2 mg/dL (ref 0.0–1.0)
pH: 5.5 (ref 5.0–8.0)

## 2022-04-01 LAB — HIV ANTIBODY (ROUTINE TESTING W REFLEX): HIV Screen 4th Generation wRfx: NONREACTIVE

## 2022-04-01 LAB — HEPATITIS C ANTIBODY: HCV Ab: NONREACTIVE

## 2022-04-01 LAB — POC URINE PREG, ED: Preg Test, Ur: NEGATIVE

## 2022-04-01 MED ORDER — METRONIDAZOLE 500 MG PO TABS: 500.0000 mg | ORAL_TABLET | Freq: Two times a day (BID) | ORAL | 0 refills | Status: DC

## 2022-04-01 NOTE — ED Triage Notes (Signed)
Pt reports ABD pain and wants STD work up because of multiple sexually partners.

## 2022-04-01 NOTE — Discharge Instructions (Signed)
You  were seen today for abdominal pain and vaginal discharge.  I have sent out flagyl to treat possible BV. Your tests will be resulted tomorrow and if anything else is positive we will call and notify you for treatment.  Please avoid intercourse until all testing is complete.  Please return or go to the ER if you having worsening pain, or develop fever, chills, nausea or vomiting.
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
## 2022-04-02 LAB — SYPHILIS: RPR W/REFLEX TO RPR TITER AND TREPONEMAL ANTIBODIES, TRADITIONAL SCREENING AND DIAGNOSIS ALGORITHM: RPR Ser Ql: NONREACTIVE

## 2022-07-26 ENCOUNTER — Other Ambulatory Visit: Payer: Self-pay

## 2022-07-26 ENCOUNTER — Emergency Department (HOSPITAL_COMMUNITY)
Admission: EM | Admit: 2022-07-26 | Discharge: 2022-07-26 | Disposition: A | Discharge status: Home / Self Care | Payer: Medicaid Other | Attending: Emergency Medicine | Admitting: Emergency Medicine

## 2022-07-26 DIAGNOSIS — M7989 Other specified soft tissue disorders: Secondary | ICD-10-CM | POA: Diagnosis present

## 2022-07-26 DIAGNOSIS — L0291 Cutaneous abscess, unspecified: Secondary | ICD-10-CM

## 2022-07-26 DIAGNOSIS — L02416 Cutaneous abscess of left lower limb: Secondary | ICD-10-CM | POA: Insufficient documentation

## 2022-07-26 MED ORDER — DOXYCYCLINE HYCLATE 100 MG PO CAPS: 100.0000 mg | ORAL_CAPSULE | Freq: Two times a day (BID) | ORAL | 0 refills | Status: DC

## 2022-07-26 MED ORDER — LIDOCAINE-EPINEPHRINE (PF) 2 %-1:200000 IJ SOLN
20.0000 mL | Freq: Once | INTRAMUSCULAR | Status: DC
  Filled 2022-07-26: qty 20

## 2022-07-26 MED ORDER — LIDOCAINE HCL URETHRAL/MUCOSAL 2 % EX GEL
1.0000 | Freq: Once | CUTANEOUS | Status: AC
  Administered 2022-07-26: 1 via TOPICAL
  Filled 2022-07-26: qty 11

## 2022-07-26 MED ORDER — NAPROXEN 500 MG PO TABS: 500.0000 mg | ORAL_TABLET | Freq: Two times a day (BID) | ORAL | 0 refills | Status: DC

## 2022-07-26 MED ORDER — HYDROCODONE-ACETAMINOPHEN 5-325 MG PO TABS
1.0000 | ORAL_TABLET | Freq: Once | ORAL | Status: AC
  Administered 2022-07-26: 1 via ORAL
  Filled 2022-07-26: qty 1

## 2022-07-26 NOTE — ED Provider Notes (Signed)
[facility] Provider Note   CSN: [id number] Arrival date & time: 07/26/22  0503     History  Chief Complaint  Patient presents with   Boil ( L shin)     Jenna [name] is a 27 y.o. female.  Patient presents emergency department with concerns about a possible abscess to the anterior left lower leg.  Patient arrives via EMS.  States that the abscess began developing approximately 3 days ago.  She denies any drainage to the area.  Patient states she has had abscesses in the past but has never had 1 this large before.  She denies fevers.  Past medical history significant for depression, anxiety, neuropathy, history of substance abuse including cocaine, alcohol, and cannabis  HPI     Home Medications Prior to Admission medications   Medication Sig Start Date End Date Taking? Authorizing Provider  doxycycline (VIBRAMYCIN) 100 MG capsule Take 1 capsule (100 mg total) by mouth 2 (two) times daily. 07/26/22  Yes Dorothyann Peng, PA-C  naproxen (NAPROSYN) 500 MG tablet Take 1 tablet (500 mg total) by mouth 2 (two) times daily. 07/26/22  Yes Dorothyann Peng, PA-C  benztropine (COGENTIN) 1 MG tablet Take 1 tablet (1 mg total) by mouth 2 (two) times daily as needed for up to 10 doses for tremors (or acute dystonic reaction including jaw locking). 02/07/22   Rosezetta Schlatter, MD  gabapentin (NEURONTIN) 300 MG capsule Take 1 capsule (300 mg total) by mouth 3 (three) times daily. 02/07/22 03/09/22  Rosezetta Schlatter, MD  metroNIDAZOLE (FLAGYL) 500 MG tablet Take 1 tablet (500 mg total) by mouth 2 (two) times daily. 04/01/22   Piontek, Junie Panning, MD  mirtazapine (REMERON) 15 MG tablet Take 1 tablet (15 mg total) by mouth at bedtime. 02/07/22 03/09/22  Rosezetta Schlatter, MD  nicotine (NICODERM CQ - DOSED IN MG/24 HOURS) 14 mg/24hr patch Place 1 patch (14 mg total) onto the skin daily. 02/08/22   Rosezetta Schlatter, MD      Allergies    Cyclobenzaprine and Haldol  [haloperidol]    Review of Systems   Review of Systems  Skin:        Abscess to left shin    Physical Exam Updated Vital Signs BP 126/83   Pulse 86   Temp 98.5 F (36.9 C)   Resp 18   SpO2 100%  Physical Exam HENT:     Head: Normocephalic and atraumatic.  Eyes:     Conjunctiva/sclera: Conjunctivae normal.  Pulmonary:     Effort: Pulmonary effort is normal. No respiratory distress.  Musculoskeletal:        General: No signs of injury.     Cervical back: Normal range of motion.  Skin:    General: Skin is dry.     Findings: Lesion present.     Comments: Swollen fluctuant area on anterior left shin with surrounding erythema.  Consistent with abscess with mild cellulitis  Neurological:     Mental Status: She is alert.  Psychiatric:        Speech: Speech normal.        Behavior: Behavior normal.     ED Results / Procedures / Treatments   Labs (all labs ordered are listed, but only abnormal results are displayed) Labs Reviewed - No data to display  EKG None  Radiology No results found.  Procedures .Marland KitchenIncision and Drainage  Date/Time: 07/26/2022 9:22 AM  Performed by: Dorothyann Peng, PA-C Authorized by: Cherlynn June  B, PA-C   Consent:    Consent obtained:  Verbal   Consent given by:  Patient   Risks, benefits, and alternatives were discussed: yes     Risks discussed:  Bleeding, incomplete drainage, pain, infection and damage to other organs   Alternatives discussed:  No treatment and alternative treatment Universal protocol:    Procedure explained and questions answered to patient or proxy's satisfaction: yes     Required blood products, implants, devices, and special equipment available: yes     Immediately prior to procedure, a time out was called: yes     Patient identity confirmed:  Verbally with patient Location:    Type:  Abscess   Size:  2x2cm   Location:  Lower extremity   Lower extremity location:  Leg   Leg location:  L lower  leg Pre-procedure details:    Skin preparation:  Chlorhexidine Sedation:    Sedation type:  None Anesthesia:    Anesthesia method:  Topical application and local infiltration   Topical anesthetic:  Lidocaine gel   Local anesthetic:  Lidocaine 2% WITH epi Procedure type:    Complexity:  Simple Procedure details:    Incision types:  Single straight   Incision depth:  Submucosal   Wound management:  Probed and deloculated   Drainage:  Purulent   Drainage amount:  Moderate   Wound treatment:  Wound left open   Packing materials:  None Post-procedure details:    Procedure completion:  Tolerated well, no immediate complications     Medications Ordered in ED Medications  lidocaine-EPINEPHrine (XYLOCAINE W/EPI) 2 %-1:200000 (PF) injection 20 mL (has no administration in time range)  HYDROcodone-acetaminophen (NORCO/VICODIN) 5-325 MG per tablet 1 tablet (1 tablet Oral Given 07/26/22 0840)  lidocaine (XYLOCAINE) 2 % jelly 1 Application (1 Application Topical Given 07/26/22 0840)    ED Course/ Medical Decision Making/ A&P                             Medical Decision Making Risk Prescription drug management.   Patient presents with chief complaint of a lesion to the left shin.  Differential includes abscess and others  I reviewed the patient's past medical history.  No recent relevant medical visits available for review  The patient's lesion is consistent with an abscess.  I performed an incision and drainage as noted in the procedure note above.  The patient tolerated the procedure well.  Plan to discharge patient home at this time with prescription for doxycycline for antibiotic coverage, Naprosyn for inflammation.  No specific follow-up is required.  Return precautions provided.        Final Clinical Impression(s) / ED Diagnoses Final diagnoses:  Abscess    Rx / DC Orders ED Discharge Orders          Ordered    doxycycline (VIBRAMYCIN) 100 MG capsule  2 times daily         07/26/22 0925    naproxen (NAPROSYN) 500 MG tablet  2 times daily        07/26/22 0925              Dorothyann Peng, PA-C 07/26/22 3329    Varney Biles, MD 07/28/22 [phone]

## 2022-07-26 NOTE — ED Triage Notes (Signed)
Patient arrived with EMS from home reports worsening skin abscess at left shin onset 3 days ago with swelling /no drainage .

## 2022-07-26 NOTE — Discharge Instructions (Addendum)
You were evaluated and treated today for an abscess of the skin.  This was drained.  Please keep the wound covered.  If it continues to drain this is perfectly fine as it will help get rid of any infection out from under the skin.  Please take the prescribed antibiotics.  I also prescribed Naprosyn which is an anti-inflammatory to help with pain.  If you develop signs of spreading infection or develop other life-threatening symptoms, please return to the emergency department.  No follow-up is otherwise required at this time

## 2022-08-01 ENCOUNTER — Ambulatory Visit (INDEPENDENT_AMBULATORY_CARE_PROVIDER_SITE_OTHER): Payer: Medicaid Other

## 2022-08-01 ENCOUNTER — Encounter (HOSPITAL_COMMUNITY): Payer: Self-pay

## 2022-08-01 ENCOUNTER — Ambulatory Visit (HOSPITAL_COMMUNITY)
Admission: EM | Admit: 2022-08-01 | Discharge: 2022-08-01 | Disposition: A | Discharge status: Home / Self Care | Payer: Medicaid Other | Attending: Internal Medicine | Admitting: Internal Medicine

## 2022-08-01 DIAGNOSIS — L97922 Non-pressure chronic ulcer of unspecified part of left lower leg with fat layer exposed: Secondary | ICD-10-CM

## 2022-08-01 DIAGNOSIS — N760 Acute vaginitis: Secondary | ICD-10-CM | POA: Diagnosis present

## 2022-08-01 DIAGNOSIS — L02419 Cutaneous abscess of limb, unspecified: Secondary | ICD-10-CM | POA: Diagnosis not present

## 2022-08-01 DIAGNOSIS — N898 Other specified noninflammatory disorders of vagina: Secondary | ICD-10-CM | POA: Insufficient documentation

## 2022-08-01 LAB — POCT URINALYSIS DIPSTICK, ED / UC
Bilirubin Urine: NEGATIVE
Glucose, UA: NEGATIVE mg/dL
Ketones, ur: 15 mg/dL — AB
Nitrite: NEGATIVE
Protein, ur: NEGATIVE mg/dL
Specific Gravity, Urine: 1.02 (ref 1.005–1.030)
Urobilinogen, UA: 1 mg/dL (ref 0.0–1.0)
pH: 7 (ref 5.0–8.0)

## 2022-08-01 LAB — POC URINE PREG, ED: Preg Test, Ur: NEGATIVE

## 2022-08-01 MED ORDER — LIDOCAINE VISCOUS HCL 2 % MT SOLN: OROMUCOSAL | 0 refills | Status: DC

## 2022-08-01 MED ORDER — SULFAMETHOXAZOLE-TRIMETHOPRIM 800-160 MG PO TABS: 2.0000 | ORAL_TABLET | Freq: Two times a day (BID) | ORAL | 0 refills | Status: AC

## 2022-08-01 MED ORDER — LIDOCAINE VISCOUS HCL 2 % MT SOLN: 15.0000 mL | Freq: Once | OROMUCOSAL | Status: DC

## 2022-08-01 MED ORDER — LIDOCAINE VISCOUS HCL 2 % MT SOLN
OROMUCOSAL | Status: AC
  Filled 2022-08-01: qty 15

## 2022-08-01 NOTE — Discharge Instructions (Signed)
Your wound twice daily with a Hibiclens foam and wipe it dry with a large piece of sterile gauze.  Once you have dried away the foam, take a smaller piece of sterile gauze and dab the center of the wound to remove any drainage.    Once the inside of the wound is dry, place a few drops of the viscous lidocaine which will provide numbness and help encourage the wound to heal.  Then open a packet of Xeroform gauze, spread it out and lay it on top of the wound.  Place a piece of nonadherent pad (Telfa) over top of the Xeroform gauze and using 3 strips of the adhesive tape, secure the nonadherent pad to your leg.  You are welcome to shower but please do so right before you plan to change the bandage using the instructions above.  You do not need to shower prior to changing the bandage.  Please take 2 tablets of Bactrim twice daily.  This is a stronger antibiotic and can be taken with or without food.  Please continue Bactrim twice daily for full 14 days.    I think that with aggressive and consistent twice daily care of your wound with twice daily antibiotics, the wound on your left lower leg should heal nicely in the next 7 to 10 days.    If you do not feel that the wound is making good progress despite your efforts, please follow-up in 1 week with one of the providers here at urgent care.  As I mentioned, I work at the urgent care and [facility] at [facility] but you are certainly welcome to go to one of our other urgent care locations such as [facility] at [employer] [address] if you do not want to wait long here at the [facility] location.  The results of your vaginal swab test which screens for BV, yeast, gonorrhea, chlamydia and trichomonas will be made posted to your MyChart account once it is complete.  This typically takes 2 to 4 days.  Please abstain from sexual intercourse of any kind, vaginal, oral or anal, until you have received the results of your STD testing.     If any  of your results are abnormal, you will receive a phone call regarding treatment.  Prescriptions, if any are needed, will be provided for you at your pharmacy.     Thank you for visiting urgent care today.  I appreciate the opportunity to participate in your care.
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
## 2022-08-01 NOTE — ED Triage Notes (Signed)
Chief Complaint: Patient having burning with urination, vaginal odor, slight thick white discharge, no itching.  Abscess on the left calf. States it popped but it is still draining and bleeding.   Onset: Abscess 1.5 weeks, Vaginal symp 1.5 weeks   Prescriptions or OTC medications tried: Yes- Naproxen, Doxycycline     with mild relief  Sick exposure: No

## 2022-08-02 LAB — CERVICOVAGINAL ANCILLARY ONLY
Bacterial Vaginitis (gardnerella): POSITIVE — AB
Candida Glabrata: NEGATIVE
Candida Vaginitis: POSITIVE — AB
Chlamydia: NEGATIVE
Comment: NEGATIVE
Comment: NEGATIVE
Comment: NEGATIVE
Comment: NEGATIVE
Comment: NEGATIVE
Comment: NORMAL
Neisseria Gonorrhea: NEGATIVE
Trichomonas: POSITIVE — AB

## 2022-08-03 ENCOUNTER — Telehealth: Payer: Self-pay | Admitting: Emergency Medicine

## 2022-08-03 DIAGNOSIS — B3731 Acute candidiasis of vulva and vagina: Secondary | ICD-10-CM

## 2022-08-03 DIAGNOSIS — A5901 Trichomonal vulvovaginitis: Secondary | ICD-10-CM

## 2022-08-03 DIAGNOSIS — N760 Acute vaginitis: Secondary | ICD-10-CM

## 2022-08-03 MED ORDER — FLUCONAZOLE 150 MG PO TABS: ORAL_TABLET | ORAL | 0 refills | Status: DC

## 2022-08-03 MED ORDER — METRONIDAZOLE 500 MG PO TABS: 500.0000 mg | ORAL_TABLET | Freq: Two times a day (BID) | ORAL | 0 refills | Status: AC

## 2022-08-03 NOTE — Telephone Encounter (Signed)
Patient tested positive for BV, vaginal candida and Trichomonas.  Rx for Metronidazole 500 mg BID x 5 days and Diflucan 150 mg q 72 hr x 3 doses sent to pharmacy.  Pt notified via [facility].

## 2022-09-09 ENCOUNTER — Ambulatory Visit (HOSPITAL_COMMUNITY)
Admission: EM | Admit: 2022-09-09 | Discharge: 2022-09-09 | Disposition: A | Discharge status: Home / Self Care | Payer: Medicaid Other | Attending: Internal Medicine | Admitting: Internal Medicine

## 2022-09-09 ENCOUNTER — Encounter (HOSPITAL_COMMUNITY): Payer: Self-pay

## 2022-09-09 DIAGNOSIS — Z20828 Contact with and (suspected) exposure to other viral communicable diseases: Secondary | ICD-10-CM | POA: Diagnosis not present

## 2022-09-09 DIAGNOSIS — Z1152 Encounter for screening for COVID-19: Secondary | ICD-10-CM | POA: Insufficient documentation

## 2022-09-09 DIAGNOSIS — F17210 Nicotine dependence, cigarettes, uncomplicated: Secondary | ICD-10-CM | POA: Diagnosis not present

## 2022-09-09 DIAGNOSIS — J209 Acute bronchitis, unspecified: Secondary | ICD-10-CM | POA: Diagnosis not present

## 2022-09-09 DIAGNOSIS — N760 Acute vaginitis: Secondary | ICD-10-CM | POA: Diagnosis not present

## 2022-09-09 DIAGNOSIS — R059 Cough, unspecified: Secondary | ICD-10-CM | POA: Diagnosis present

## 2022-09-09 LAB — SARS CORONAVIRUS 2 (TAT 6-24 HRS): SARS Coronavirus 2: NEGATIVE

## 2022-09-09 MED ORDER — BENZONATATE 100 MG PO CAPS: 100.0000 mg | ORAL_CAPSULE | Freq: Three times a day (TID) | ORAL | 0 refills | Status: DC | PRN

## 2022-09-09 MED ORDER — ALBUTEROL SULFATE HFA 108 (90 BASE) MCG/ACT IN AERS: 1.0000 | INHALATION_SPRAY | Freq: Four times a day (QID) | RESPIRATORY_TRACT | 0 refills | Status: DC | PRN

## 2022-09-09 MED ORDER — METRONIDAZOLE 500 MG PO TABS: 500.0000 mg | ORAL_TABLET | Freq: Two times a day (BID) | ORAL | 0 refills | Status: DC

## 2022-09-09 MED ORDER — GUAIFENESIN ER 600 MG PO TB12: 600.0000 mg | ORAL_TABLET | Freq: Two times a day (BID) | ORAL | 0 refills | Status: DC

## 2022-09-09 MED ORDER — FLUCONAZOLE 150 MG PO TABS: 150.0000 mg | ORAL_TABLET | Freq: Once | ORAL | 0 refills | Status: AC
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
## 2022-09-09 NOTE — ED Triage Notes (Signed)
Patient states she is here to get treated for trich and BV. Patient states she was seen earlier for a leg infection and did not receive antibiotics for both.  Patient states she also has a productive cough with green sputum and has a sore throat x 2 days.  Patient states she has not taken any meds for her symptoms.

## 2022-09-09 NOTE — Discharge Instructions (Addendum)
Please maintain adequate hydration Take medications as prescribed Please avoid sexual intercourse for 7 days as well as the medications work Safe sex practices advised If you have worsening shortness of breath, vaginal discharge, dysuria or abdominal pain please return to urgent care to be reevaluated Humidifier and VapoRub use will help with nasal congestion and cough. Smoke cessation will help with cough symptoms.

## 2022-11-01 DIAGNOSIS — F4310 Post-traumatic stress disorder, unspecified: Secondary | ICD-10-CM | POA: Diagnosis not present

## 2022-12-03 ENCOUNTER — Encounter (HOSPITAL_COMMUNITY): Payer: Self-pay | Admitting: Emergency Medicine

## 2022-12-03 ENCOUNTER — Other Ambulatory Visit: Payer: Self-pay

## 2022-12-03 ENCOUNTER — Emergency Department (HOSPITAL_COMMUNITY)
Admission: EM | Admit: 2022-12-03 | Discharge: 2022-12-04 | Discharge status: Against Medical Advice | Payer: 59 | Attending: Emergency Medicine | Admitting: Emergency Medicine

## 2022-12-03 DIAGNOSIS — R1030 Lower abdominal pain, unspecified: Secondary | ICD-10-CM | POA: Diagnosis not present

## 2022-12-03 DIAGNOSIS — R112 Nausea with vomiting, unspecified: Secondary | ICD-10-CM | POA: Diagnosis not present

## 2022-12-03 DIAGNOSIS — R197 Diarrhea, unspecified: Secondary | ICD-10-CM | POA: Diagnosis not present

## 2022-12-03 DIAGNOSIS — Z5321 Procedure and treatment not carried out due to patient leaving prior to being seen by health care provider: Secondary | ICD-10-CM | POA: Diagnosis not present

## 2022-12-03 DIAGNOSIS — R350 Frequency of micturition: Secondary | ICD-10-CM | POA: Insufficient documentation

## 2022-12-03 LAB — CBC
HCT: 33.3 % — ABNORMAL LOW (ref 36.0–46.0)
Hemoglobin: 10.6 g/dL — ABNORMAL LOW (ref 12.0–15.0)
MCH: 28 pg (ref 26.0–34.0)
MCHC: 31.8 g/dL (ref 30.0–36.0)
MCV: 88.1 fL (ref 80.0–100.0)
Platelets: 273 10*3/uL (ref 150–400)
RBC: 3.78 MIL/uL — ABNORMAL LOW (ref 3.87–5.11)
RDW: 16.6 % — ABNORMAL HIGH (ref 11.5–15.5)
WBC: 7.5 10*3/uL (ref 4.0–10.5)
nRBC: 0 % (ref 0.0–0.2)

## 2022-12-03 LAB — URINALYSIS, ROUTINE W REFLEX MICROSCOPIC
Bilirubin Urine: NEGATIVE
Glucose, UA: NEGATIVE mg/dL
Hgb urine dipstick: NEGATIVE
Ketones, ur: NEGATIVE mg/dL
Leukocytes,Ua: NEGATIVE
Nitrite: NEGATIVE
Protein, ur: NEGATIVE mg/dL
Specific Gravity, Urine: 1.021 (ref 1.005–1.030)
pH: 5 (ref 5.0–8.0)

## 2022-12-03 MED ORDER — ONDANSETRON 4 MG PO TBDP
4.0000 mg | ORAL_TABLET | Freq: Once | ORAL | Status: AC | PRN
  Administered 2022-12-03: 4 mg via ORAL
  Filled 2022-12-03: qty 1

## 2022-12-03 NOTE — ED Triage Notes (Signed)
Pt c/o lower abd pain that started when starting her cycle on Friday. No longer on her menstrual cycle and pain continues. Pain radiates into her lower back. Pain associated with NVD and urinary frequency. Concern for STD exposure.

## 2022-12-03 NOTE — ED Notes (Signed)
Pt called for registration x2 times with no answer

## 2022-12-04 LAB — LIPASE, BLOOD: Lipase: 26 U/L (ref 11–51)

## 2022-12-04 LAB — COMPREHENSIVE METABOLIC PANEL
ALT: 34 U/L (ref 0–44)
AST: 40 U/L (ref 15–41)
Albumin: 3.5 g/dL (ref 3.5–5.0)
Alkaline Phosphatase: 48 U/L (ref 38–126)
Anion gap: 7 (ref 5–15)
BUN: 16 mg/dL (ref 6–20)
CO2: 24 mmol/L (ref 22–32)
Calcium: 8.8 mg/dL — ABNORMAL LOW (ref 8.9–10.3)
Chloride: 106 mmol/L (ref 98–111)
Creatinine, Ser: 0.84 mg/dL (ref 0.44–1.00)
GFR, Estimated: >60 mL/min (ref >60)
Glucose, Bld: 86 mg/dL (ref 70–99)
Potassium: 3.8 mmol/L (ref 3.5–5.1)
Sodium: 137 mmol/L (ref 135–145)
Total Bilirubin: 0.4 mg/dL (ref 0.3–1.2)
Total Protein: 6.1 g/dL — ABNORMAL LOW (ref 6.5–8.1)

## 2022-12-04 LAB — I-STAT BETA HCG BLOOD, ED (MC, WL, AP ONLY): I-stat hCG, quantitative: <5 m[IU]/mL (ref <5)

## 2022-12-04 NOTE — ED Notes (Signed)
Pt called with no answer x2.  

## 2023-04-22 ENCOUNTER — Emergency Department (HOSPITAL_COMMUNITY)
Admission: EM | Admit: 2023-04-22 | Discharge: 2023-04-22 | Discharge status: Against Medical Advice | Payer: 59 | Attending: Emergency Medicine | Admitting: Emergency Medicine

## 2023-04-22 ENCOUNTER — Other Ambulatory Visit: Payer: Self-pay

## 2023-04-22 ENCOUNTER — Encounter (HOSPITAL_COMMUNITY): Payer: Self-pay | Admitting: Emergency Medicine

## 2023-04-22 DIAGNOSIS — Z5321 Procedure and treatment not carried out due to patient leaving prior to being seen by health care provider: Secondary | ICD-10-CM | POA: Diagnosis not present

## 2023-04-22 DIAGNOSIS — I959 Hypotension, unspecified: Secondary | ICD-10-CM | POA: Diagnosis not present

## 2023-04-22 DIAGNOSIS — R55 Syncope and collapse: Secondary | ICD-10-CM | POA: Insufficient documentation

## 2023-04-22 DIAGNOSIS — R42 Dizziness and giddiness: Secondary | ICD-10-CM | POA: Diagnosis not present

## 2023-04-22 LAB — CBC WITH DIFFERENTIAL/PLATELET
Abs Immature Granulocytes: 0.04 10*3/uL (ref 0.00–0.07)
Basophils Absolute: 0 10*3/uL (ref 0.0–0.1)
Basophils Relative: 0 %
Eosinophils Absolute: 0.2 10*3/uL (ref 0.0–0.5)
Eosinophils Relative: 2 %
HCT: 38 % (ref 36.0–46.0)
Hemoglobin: 12.1 g/dL (ref 12.0–15.0)
Immature Granulocytes: 0 %
Lymphocytes Relative: 19 %
Lymphs Abs: 1.8 10*3/uL (ref 0.7–4.0)
MCH: 27.6 pg (ref 26.0–34.0)
MCHC: 31.8 g/dL (ref 30.0–36.0)
MCV: 86.6 fL (ref 80.0–100.0)
Monocytes Absolute: 0.7 10*3/uL (ref 0.1–1.0)
Monocytes Relative: 8 %
Neutro Abs: 6.8 10*3/uL (ref 1.7–7.7)
Neutrophils Relative %: 71 %
Platelets: 304 10*3/uL (ref 150–400)
RBC: 4.39 MIL/uL (ref 3.87–5.11)
RDW: 17.3 % — ABNORMAL HIGH (ref 11.5–15.5)
WBC: 9.6 10*3/uL (ref 4.0–10.5)
nRBC: 0 % (ref 0.0–0.2)

## 2023-04-22 LAB — HCG, SERUM, QUALITATIVE: Preg, Serum: NEGATIVE

## 2023-04-22 LAB — BASIC METABOLIC PANEL
Anion gap: 6 (ref 5–15)
BUN: 14 mg/dL (ref 6–20)
CO2: 23 mmol/L (ref 22–32)
Calcium: 8.3 mg/dL — ABNORMAL LOW (ref 8.9–10.3)
Chloride: 107 mmol/L (ref 98–111)
Creatinine, Ser: 0.69 mg/dL (ref 0.44–1.00)
GFR, Estimated: >60 mL/min (ref >60)
Glucose, Bld: 83 mg/dL (ref 70–99)
Potassium: 4 mmol/L (ref 3.5–5.1)
Sodium: 136 mmol/L (ref 135–145)

## 2023-04-22 LAB — CBG MONITORING, ED: Glucose-Capillary: 80 mg/dL (ref 70–99)

## 2023-04-22 MED ORDER — ONDANSETRON 8 MG PO TBDP
8.0000 mg | ORAL_TABLET | Freq: Once | ORAL | Status: AC
  Administered 2023-04-22: 8 mg via ORAL
  Filled 2023-04-22: qty 1

## 2023-04-22 NOTE — ED Provider Triage Note (Signed)
Emergency Medicine Provider Triage Evaluation Note  Jenna [name] , a 27 y.o. female  was evaluated in triage.  Pt complains of syncope after donating plasma today. Reported several syncopal episodes with loss of bowel control. Received 500 cc NS per EMS.  Review of Systems  Positive: Syncope, mild abdominal pain, loose stool Negative: Fever, chills, chest pain, shortness of breath  Physical Exam  BP (!) 98/56 (BP Location: Right Arm)   Pulse 79   Temp 98.2 F (36.8 C) (Oral)   Resp 18   Ht 5\' 5"  (1.651 m)   Wt 90.7 kg   SpO2 100%   BMI 33.28 kg/m  Gen:   Awake, no distress   Resp:  Normal effort  MSK:   Moves extremities without difficulty  Other:  No oral trauma  Medical Decision Making  Medically screening exam initiated at 6:35 PM.  Appropriate orders placed.  Jenna [name] was informed that the remainder of the evaluation will be completed by another provider, this initial triage assessment does not replace that evaluation, and the importance of remaining in the ED until their evaluation is complete.     Felicie Morn, NP 04/22/23 Jenna [name]

## 2023-04-22 NOTE — ED Triage Notes (Signed)
Per [facility] pt coming from hotel c/o syncopal episode after donating plasma today. Patient given 500 CC NS en route.

## 2023-07-28 ENCOUNTER — Encounter: Payer: Self-pay | Admitting: Physician Assistant

## 2023-07-28 ENCOUNTER — Other Ambulatory Visit: Payer: Self-pay | Admitting: Physician Assistant

## 2023-07-28 ENCOUNTER — Ambulatory Visit: Payer: 59 | Admitting: Physician Assistant

## 2023-07-28 VITALS — BP 123/65 | HR 81 | Ht 66.0 in | Wt 190.0 lb

## 2023-07-28 DIAGNOSIS — G8929 Other chronic pain: Secondary | ICD-10-CM | POA: Diagnosis not present

## 2023-07-28 DIAGNOSIS — N760 Acute vaginitis: Secondary | ICD-10-CM

## 2023-07-28 DIAGNOSIS — B9689 Other specified bacterial agents as the cause of diseases classified elsewhere: Secondary | ICD-10-CM

## 2023-07-28 DIAGNOSIS — Z8781 Personal history of (healed) traumatic fracture: Secondary | ICD-10-CM | POA: Diagnosis not present

## 2023-07-28 DIAGNOSIS — B3731 Acute candidiasis of vulva and vagina: Secondary | ICD-10-CM | POA: Diagnosis not present

## 2023-07-28 DIAGNOSIS — Z124 Encounter for screening for malignant neoplasm of cervix: Secondary | ICD-10-CM

## 2023-07-28 DIAGNOSIS — M79602 Pain in left arm: Secondary | ICD-10-CM

## 2023-07-28 DIAGNOSIS — A599 Trichomoniasis, unspecified: Secondary | ICD-10-CM | POA: Diagnosis not present

## 2023-07-28 DIAGNOSIS — M79601 Pain in right arm: Secondary | ICD-10-CM | POA: Diagnosis not present

## 2023-07-28 DIAGNOSIS — Z114 Encounter for screening for human immunodeficiency virus [HIV]: Secondary | ICD-10-CM

## 2023-07-28 DIAGNOSIS — Z113 Encounter for screening for infections with a predominantly sexual mode of transmission: Secondary | ICD-10-CM

## 2023-07-28 MED ORDER — GABAPENTIN 100 MG PO CAPS: 100.0000 mg | ORAL_CAPSULE | Freq: Two times a day (BID) | ORAL | 1 refills | Status: DC

## 2023-07-28 MED ORDER — IBUPROFEN 800 MG PO TABS: 800.0000 mg | ORAL_TABLET | Freq: Three times a day (TID) | ORAL | 0 refills | Status: DC | PRN

## 2023-07-28 MED ORDER — METRONIDAZOLE 500 MG PO TABS: 500.0000 mg | ORAL_TABLET | Freq: Two times a day (BID) | ORAL | 0 refills | Status: DC

## 2023-07-28 MED ORDER — FLUCONAZOLE 150 MG PO TABS: 150.0000 mg | ORAL_TABLET | Freq: Once | ORAL | 0 refills | Status: AC

## 2023-07-28 MED ORDER — KETOROLAC TROMETHAMINE 60 MG/2ML IM SOLN
60.0000 mg | Freq: Once | INTRAMUSCULAR | Status: AC
  Administered 2023-07-28: 60 mg via INTRAMUSCULAR

## 2023-07-28 MED ORDER — METHYLPREDNISOLONE ACETATE 80 MG/ML IJ SUSP
80.0000 mg | Freq: Once | INTRAMUSCULAR | Status: AC
  Administered 2023-07-28: 80 mg via INTRAMUSCULAR
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
## 2023-07-28 NOTE — Patient Instructions (Signed)
I have started a referral for you to be seen by pain management and by gynecology.  They will reach out to you to schedule an appointment  I have restarted the gabapentin you will take 100 mg twice a day.  I sent a prescription for ibuprofen 800 mg you can use this 3 times a day as needed.  We will call you with today's lab results.  You will follow-up with the mobile unit in 2 to 3 weeks.  We will reach out to you to let you know of our location.  Roney Jaffe, PA-C Physician Assistant [facility] [email or web address]   Neuropathic Pain Neuropathic pain is pain caused by damage to the nerves that are responsible for certain sensations in your body (sensory nerves). Neuropathic pain can make you more sensitive to pain. Even a minor sensation can feel very painful. This is usually a long-term (chronic) condition that can be difficult to treat. The type of pain differs from person to person. It may: Start suddenly (acute), or it may develop slowly and become chronic. Come and go as damaged nerves heal, or it may stay at the same level for years. Cause emotional distress, loss of sleep, and a lower quality of life. What are the causes? The most common cause of this condition is diabetes. Many other diseases and conditions can also cause neuropathic pain. Causes of neuropathic pain can be classified as: Toxic. This is caused by medicines and chemicals. The most common causes of toxic neuropathic pain is damage from medicines that kill cancer cells (chemotherapy) or alcohol abuse. Metabolic. This can be caused by: Diabetes. Lack of vitamins like B12. Traumatic. Any injury that cuts, crushes, or stretches a nerve can cause damage and pain. Compression-related. If a sensory nerve gets trapped or compressed for a long period of time, the blood supply to the nerve can be cut off. Vascular. Many blood vessel diseases can cause neuropathic pain  by decreasing blood supply and oxygen to nerves. Autoimmune. This type of pain results from diseases in which the body's defense system (immune system) mistakenly attacks sensory nerves. Examples of autoimmune diseases that can cause neuropathic pain include lupus and multiple sclerosis. Infectious. Many types of viral infections can damage sensory nerves and cause pain. Shingles infection is a common cause of this type of pain. Inherited. Neuropathic pain can be a symptom of many diseases that are passed down through families (genetic). What increases the risk? You are more likely to develop this condition if: You have diabetes. You smoke. You drink too much alcohol. You are taking certain medicines, including chemotherapy or medicines that treat immune system disorders. What are the signs or symptoms? The main symptom is pain. Neuropathic pain is often described as: Burning. Shock-like. Stinging. Hot or cold. Itching. How is this diagnosed? No single test can diagnose neuropathic pain. It is diagnosed based on: A physical exam and your symptoms. Your health care provider will ask you about your pain. You may be asked to use a pain scale to describe how bad your pain is. Tests. These may be done to see if you have a cause and location of any nerve damage. They include: Nerve conduction studies and electromyography to test how well nerve signals travel through your nerves and muscles (electrodiagnostic testing). Skin biopsy to evaluate for small fiber neuropathy. Imaging studies, such as: X-rays. CT scan. MRI. How is this treated? Treatment for neuropathic pain may change over time. You may need to try different  treatment options or a combination of treatments. Some options include: Treating the underlying cause of the neuropathy, such as diabetes, kidney disease, or vitamin deficiencies. Stopping medicines that can cause neuropathy, such as chemotherapy. Medicine to relieve pain.  Medicines may include: Prescription or over-the-counter pain medicine. Anti-seizure medicine. Antidepressant medicines. Pain-relieving patches or creams that are applied to painful areas of skin. A medicine to numb the area (local anesthetic), which can be injected as a nerve block. Transcutaneous nerve stimulation. This uses electrical currents to block painful nerve signals. The treatment is painless. Alternative treatments, such as: Acupuncture. Meditation. Massage. Occupational or physical therapy. Pain management programs. Counseling. Follow these instructions at home: Medicines  Take over-the-counter and prescription medicines only as told by your health care provider. Ask your health care provider if the medicine prescribed to you: Requires you to avoid driving or using machinery. Can cause constipation. You may need to take these actions to prevent or treat constipation: Drink enough fluid to keep your urine pale yellow. Take over-the-counter or prescription medicines. Eat foods that are high in fiber, such as beans, whole grains, and fresh fruits and vegetables. Limit foods that are high in fat and processed sugars, such as fried or sweet foods. Lifestyle  Have a good support system at home. Consider joining a chronic pain support group. Do not use any products that contain nicotine or tobacco. These products include cigarettes, chewing tobacco, and vaping devices, such as e-cigarettes. If you need help quitting, ask your health care provider. Do not drink alcohol. General instructions Learn as much as you can about your condition. Work closely with all your health care providers to find the treatment plan that works best for you. Ask your health care provider what activities are safe for you. Keep all follow-up visits. This is important. Contact a health care provider if: Your pain treatments are not working. You are having side effects from your medicines. You are  struggling with tiredness (fatigue), mood changes, depression, or anxiety. Get help right away if: You have thoughts of hurting yourself. Get help right away if you feel like you may hurt yourself or others, or have thoughts about taking your own life. Go to your nearest emergency room or: Call 911. Call the National Suicide Prevention Lifeline at (727)379-4807 or 988. This is open 24 hours a day. Text the Crisis Text Line at 843 032 2266. Summary Neuropathic pain is pain caused by damage to the nerves that are responsible for certain sensations in your body (sensory nerves). Neuropathic pain may come and go as damaged nerves heal, or it may stay at the same level for years. Neuropathic pain is usually a long-term condition that can be difficult to treat. Consider joining a chronic pain support group. This information is not intended to replace advice given to you by your health care provider. Make sure you discuss any questions you have with your health care provider. Document Revised: 02/12/2021 Document Reviewed: 02/12/2021 Elsevier Patient Education  2024 [employer].

## 2023-07-29 LAB — COMP. METABOLIC PANEL (12)
AST: 20 [IU]/L (ref 0–40)
Albumin: 4.2 g/dL (ref 4.0–5.0)
Alkaline Phosphatase: 64 [IU]/L (ref 44–121)
BUN/Creatinine Ratio: 14 (ref 9–23)
BUN: 11 mg/dL (ref 6–20)
Bilirubin Total: <0.2 mg/dL (ref 0.0–1.2)
Calcium: 9.5 mg/dL (ref 8.7–10.2)
Chloride: 101 mmol/L (ref 96–106)
Creatinine, Ser: 0.76 mg/dL (ref 0.57–1.00)
Globulin, Total: 3.1 g/dL (ref 1.5–4.5)
Glucose: 75 mg/dL (ref 70–99)
Potassium: 5 mmol/L (ref 3.5–5.2)
Sodium: 141 mmol/L (ref 134–144)
Total Protein: 7.3 g/dL (ref 6.0–8.5)
eGFR: 110 mL/min/{1.73_m2} (ref >59)

## 2023-07-29 LAB — HIV ANTIBODY (ROUTINE TESTING W REFLEX): HIV Screen 4th Generation wRfx: NONREACTIVE

## 2023-07-29 LAB — RPR: RPR Ser Ql: NONREACTIVE

## 2023-08-06 LAB — GC/CHLAMYDIA PROBE AMP
Chlamydia trachomatis, NAA: NEGATIVE
Neisseria Gonorrhoeae by PCR: NEGATIVE

## 2023-08-06 LAB — NUSWAB BV AND CANDIDA, NAA
Atopobium vaginae: HIGH {score} — AB
BVAB 2: HIGH {score} — AB
Candida albicans, NAA: NEGATIVE
Candida glabrata, NAA: NEGATIVE
Megasphaera 1: HIGH {score} — AB

## 2023-08-06 LAB — SPECIMEN STATUS REPORT

## 2023-08-11 ENCOUNTER — Encounter: Payer: Self-pay | Admitting: Physician Assistant

## 2023-08-12 ENCOUNTER — Telehealth: Payer: Self-pay | Admitting: Nurse Practitioner

## 2023-08-12 NOTE — Telephone Encounter (Signed)
Contacted pt to inform her of MMU locations to compete follow up. Pt stated that she would complete a follow up on 2/19.
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
## 2023-08-13 NOTE — Congregational Nurse Program (Signed)
Ms. Smallman returned to RN office and Congregational Nurse Program is able to assist client with temporary housing for two nights for client. She is exhausted and not had sleep due to safety concerns of being unhomed. She has assistance so she can go to her MD appointment tomorrow, RN able to secure transportation to help her get to appointment. Additionally bus passes given to help her after appointment. She will follow up with RN to address any needs she may have.

## 2023-08-14 ENCOUNTER — Telehealth: Payer: 59 | Admitting: Emergency Medicine

## 2023-08-14 ENCOUNTER — Encounter: Payer: Self-pay | Admitting: Emergency Medicine

## 2023-08-14 VITALS — BP 148/93 | HR 82 | Resp 16

## 2023-08-14 DIAGNOSIS — G8929 Other chronic pain: Secondary | ICD-10-CM | POA: Diagnosis not present

## 2023-08-14 NOTE — Patient Instructions (Addendum)
Pain Management Referral [facility] Ph. # [phone] Address [address] [place], [place] [address]  GYN Referral [facility] [address]  [place], [place] [address]

## 2023-08-14 NOTE — Congregational Nurse Program (Signed)
Client followed up with RN after appointment with establishing care. She said appointment well and she is going to follow up on making appointments for OBGYN and pain clinic. RN talked to her about pharmacy meds and she will pick them up. Bus passes provided for client and transportation for client to get hotel also provided.  She also expressed need for a tent and RN was able to use resources to find a tent and a sleeping bag for client to use when temporary housing ends tomorrow. RN will continue to assist client with her needs to help her get back on her feet.

## 2023-08-14 NOTE — Progress Notes (Signed)
The patient attended her virtual primary care appt on 08/14/23 where her BP screening results was 148/93. At the appt the patient noted she has food,housing,and transportation insecurities. Pt also noted ipv. Patient was given [facility] Resources. Pt also gave CHW consent to do a 360 referral. Per chart review pt has a future behavioral health appt on 10/28/23. A follow up will be done at a later date per the health equity team protocol.

## 2023-08-14 NOTE — Progress Notes (Signed)
Pt presents to establish care -experiencing bilateral arm pain, referral to pain mgmnt completed by C. Mayers, PA on 07/28/23 -pt scheduled by Raven @[facility] , pt states that she is experiencing cold symptoms going on for a week, -runny nose  -cough -denies any fever, chills, nausea or vomiting
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
## 2023-08-15 ENCOUNTER — Other Ambulatory Visit (HOSPITAL_COMMUNITY): Payer: Self-pay

## 2023-08-15 ENCOUNTER — Telehealth: Payer: Self-pay

## 2023-08-15 DIAGNOSIS — G8929 Other chronic pain: Secondary | ICD-10-CM

## 2023-08-15 DIAGNOSIS — A599 Trichomoniasis, unspecified: Secondary | ICD-10-CM

## 2023-08-15 DIAGNOSIS — B9689 Other specified bacterial agents as the cause of diseases classified elsewhere: Secondary | ICD-10-CM

## 2023-08-15 MED ORDER — METRONIDAZOLE 500 MG PO TABS
500.0000 mg | ORAL_TABLET | Freq: Two times a day (BID) | ORAL | 0 refills | Status: DC
  Filled 2023-08-15: qty 14, 7d supply, fill #0

## 2023-08-15 MED ORDER — IBUPROFEN 800 MG PO TABS
800.0000 mg | ORAL_TABLET | Freq: Three times a day (TID) | ORAL | 0 refills | Status: DC | PRN
Start: 2023-08-15 — End: 2023-09-19
  Filled 2023-08-15: qty 30, 10d supply, fill #0

## 2023-08-15 MED ORDER — GABAPENTIN 100 MG PO CAPS
100.0000 mg | ORAL_CAPSULE | Freq: Two times a day (BID) | ORAL | 1 refills | Status: DC
  Filled 2023-08-15: qty 60, 30d supply, fill #0

## 2023-08-15 NOTE — Telephone Encounter (Signed)
Att to contact pt at [phone], call cannot be completed at this time.  -called to verify if patient was able to pickup Rx from pharmacy, pt was advised at office visit that she could possibly receive some financial assistance w/medications if she were to change pharmacy to [facility], pt agreed.

## 2023-08-15 NOTE — Addendum Note (Signed)
Addended by: Cathlyn Parsons on: 08/15/2023 11:43 AM   Modules accepted: Orders

## 2023-08-27 ENCOUNTER — Other Ambulatory Visit (HOSPITAL_COMMUNITY): Payer: Self-pay

## 2023-09-02 ENCOUNTER — Emergency Department (HOSPITAL_COMMUNITY)
Admission: EM | Admit: 2023-09-02 | Discharge: 2023-09-03 | Disposition: A | Discharge status: Other Acute Inpatient Hospital | Payer: MEDICAID

## 2023-09-02 ENCOUNTER — Emergency Department (HOSPITAL_COMMUNITY): Payer: MEDICAID

## 2023-09-02 ENCOUNTER — Other Ambulatory Visit: Payer: Self-pay

## 2023-09-02 ENCOUNTER — Inpatient Hospital Stay (HOSPITAL_COMMUNITY): Admission: AD | Admit: 2023-09-02 | Payer: MEDICAID | Source: Intra-hospital | Admitting: Psychiatry

## 2023-09-02 ENCOUNTER — Encounter (HOSPITAL_COMMUNITY): Payer: Self-pay

## 2023-09-02 DIAGNOSIS — T50904A Poisoning by unspecified drugs, medicaments and biological substances, undetermined, initial encounter: Secondary | ICD-10-CM | POA: Insufficient documentation

## 2023-09-02 DIAGNOSIS — Z5900 Homelessness unspecified: Secondary | ICD-10-CM | POA: Insufficient documentation

## 2023-09-02 DIAGNOSIS — F39 Unspecified mood [affective] disorder: Secondary | ICD-10-CM | POA: Insufficient documentation

## 2023-09-02 DIAGNOSIS — F17210 Nicotine dependence, cigarettes, uncomplicated: Secondary | ICD-10-CM | POA: Insufficient documentation

## 2023-09-02 DIAGNOSIS — Z79899 Other long term (current) drug therapy: Secondary | ICD-10-CM | POA: Diagnosis not present

## 2023-09-02 DIAGNOSIS — T50901A Poisoning by unspecified drugs, medicaments and biological substances, accidental (unintentional), initial encounter: Secondary | ICD-10-CM | POA: Diagnosis not present

## 2023-09-02 DIAGNOSIS — F1410 Cocaine abuse, uncomplicated: Secondary | ICD-10-CM | POA: Diagnosis not present

## 2023-09-02 DIAGNOSIS — F19159 Other psychoactive substance abuse with psychoactive substance-induced psychotic disorder, unspecified: Secondary | ICD-10-CM | POA: Diagnosis not present

## 2023-09-02 LAB — RESP PANEL BY RT-PCR (RSV, FLU A&B, COVID)  RVPGX2
Influenza A by PCR: NEGATIVE
Influenza B by PCR: NEGATIVE
Resp Syncytial Virus by PCR: NEGATIVE
SARS Coronavirus 2 by RT PCR: NEGATIVE

## 2023-09-02 LAB — CBC WITH DIFFERENTIAL/PLATELET
Abs Immature Granulocytes: 0.03 10*3/uL (ref 0.00–0.07)
Basophils Absolute: 0.1 10*3/uL (ref 0.0–0.1)
Basophils Relative: 1 %
Eosinophils Absolute: 0.2 10*3/uL (ref 0.0–0.5)
Eosinophils Relative: 3 %
HCT: 31.1 % — ABNORMAL LOW (ref 36.0–46.0)
Hemoglobin: 9.8 g/dL — ABNORMAL LOW (ref 12.0–15.0)
Immature Granulocytes: 0 %
Lymphocytes Relative: 27 %
Lymphs Abs: 1.9 10*3/uL (ref 0.7–4.0)
MCH: 26.9 pg (ref 26.0–34.0)
MCHC: 31.5 g/dL (ref 30.0–36.0)
MCV: 85.4 fL (ref 80.0–100.0)
Monocytes Absolute: 0.5 10*3/uL (ref 0.1–1.0)
Monocytes Relative: 7 %
Neutro Abs: 4.5 10*3/uL (ref 1.7–7.7)
Neutrophils Relative %: 62 %
Platelets: 281 10*3/uL (ref 150–400)
RBC: 3.64 MIL/uL — ABNORMAL LOW (ref 3.87–5.11)
RDW: 17.2 % — ABNORMAL HIGH (ref 11.5–15.5)
WBC: 7.2 10*3/uL (ref 4.0–10.5)
nRBC: 0 % (ref 0.0–0.2)

## 2023-09-02 LAB — COMPREHENSIVE METABOLIC PANEL
ALT: 19 U/L (ref 0–44)
AST: 27 U/L (ref 15–41)
Albumin: 3.3 g/dL — ABNORMAL LOW (ref 3.5–5.0)
Alkaline Phosphatase: 41 U/L (ref 38–126)
Anion gap: 13 (ref 5–15)
BUN: 7 mg/dL (ref 6–20)
CO2: 23 mmol/L (ref 22–32)
Calcium: 8.5 mg/dL — ABNORMAL LOW (ref 8.9–10.3)
Chloride: 106 mmol/L (ref 98–111)
Creatinine, Ser: 0.96 mg/dL (ref 0.44–1.00)
GFR, Estimated: >60 mL/min (ref >60)
Glucose, Bld: 123 mg/dL — ABNORMAL HIGH (ref 70–99)
Potassium: 3.4 mmol/L — ABNORMAL LOW (ref 3.5–5.1)
Sodium: 142 mmol/L (ref 135–145)
Total Bilirubin: 0.4 mg/dL (ref 0.0–1.2)
Total Protein: 6.2 g/dL — ABNORMAL LOW (ref 6.5–8.1)

## 2023-09-02 LAB — SALICYLATE LEVEL: Salicylate Lvl: <7 mg/dL — ABNORMAL LOW (ref 7.0–30.0)

## 2023-09-02 LAB — ETHANOL: Alcohol, Ethyl (B): <10 mg/dL (ref <10)

## 2023-09-02 LAB — ACETAMINOPHEN LEVEL
Acetaminophen (Tylenol), Serum: <10 ug/mL — ABNORMAL LOW (ref 10–30)
Acetaminophen (Tylenol), Serum: <10 ug/mL — ABNORMAL LOW (ref 10–30)

## 2023-09-02 LAB — HCG, SERUM, QUALITATIVE: Preg, Serum: NEGATIVE

## 2023-09-02 MED ORDER — LACTATED RINGERS IV BOLUS
1000.0000 mL | Freq: Once | INTRAVENOUS | Status: AC
  Administered 2023-09-02: 1000 mL via INTRAVENOUS

## 2023-09-02 MED ORDER — IBUPROFEN 200 MG PO TABS
200.0000 mg | ORAL_TABLET | Freq: Once | ORAL | Status: AC
  Administered 2023-09-02: 200 mg via ORAL
  Filled 2023-09-02: qty 1

## 2023-09-02 NOTE — [facility] Counselor (Signed)
 TTS Note:    @ 8:07 PM-Patient was deferred to [facility] Telehealth Care Coordinator (TCC), Marylene Land, at [phone]. The [facility] Care Coordinator will notify the patient's care team when the [facility] provider is ready to initiate the telehealth assessment. If there are any questions, please reach out to the [facility] Coordinator.

## 2023-09-02 NOTE — ED Notes (Signed)
 [id number]

## 2023-09-02 NOTE — ED Notes (Signed)
 IVC Paperwork completed. Original in [employer], 1 copy labeled and in medrec drawer, 3 copies on clipboard at green NSMT station. EXP: 09/08/23

## 2023-09-02 NOTE — ED Triage Notes (Signed)
 Pt homeless and states she took 1 xanax EMS states pt had 1 episode of vomiting, hypotensive, and bradycardic.
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
## 2023-09-02 NOTE — ED Provider Notes (Signed)
 [place] EMERGENCY DEPARTMENT AT [facility] Provider Note   CSN: [id number] Arrival date & time: 09/02/23  1550     History  Chief Complaint  Patient presents with   Drug Overdose    Jenna [name] is a 28 y.o. female.  28 year old female with past medical history of suicide attempt in the past presenting to the emergency department today after an apparent intentional overdose.  The patient is very groggy on arrival and tells me that she took an unknown number of a "muscle relaxer".  She is unable to tell me what the medication was or where she got this.  She tells me that she is not suicidal but cannot tell me why she took the medications.  Looking back through her chart it does appear she has a history of a suicide attempt in the past.  It does appear that she is homeless.   Drug Overdose       Home Medications Prior to Admission medications   Medication Sig Start Date End Date Taking? Authorizing Provider  gabapentin (NEURONTIN) 100 MG capsule Take 1 capsule (100 mg total) by mouth 2 (two) times daily. Patient not taking: Reported on 09/02/2023 08/15/23   Cathlyn Parsons, NP  ibuprofen (ADVIL) 800 MG tablet Take 1 tablet (800 mg total) by mouth every 8 (eight) hours as needed. Patient not taking: Reported on 09/02/2023 08/15/23   Cathlyn Parsons, NP  metroNIDAZOLE (FLAGYL) 500 MG tablet Take 1 tablet (500 mg total) by mouth 2 (two) times daily. Patient not taking: Reported on 09/02/2023 08/15/23   Cathlyn Parsons, NP      Allergies    Cyclobenzaprine and Haldol [haloperidol]    Review of Systems   Review of Systems  Reason unable to perform ROS: Poor historian, somnolent.    Physical Exam Updated Vital Signs BP 127/75   Pulse 82   Temp (!) 100.4 F (38 C) (Oral)   Resp 19   SpO2 100%  Physical Exam Vitals and nursing note reviewed.   Gen: Somnolent but will arouse to verbal stimuli Eyes: Pupils 3 mm and reactive bilaterally HEENT: no oropharyngeal  swelling, no signs of head trauma Neck: trachea midline Resp: clear to auscultation bilaterally Card: RRR, no murmurs, rubs, or gallops Abd: nontender, nondistended Extremities: no calf tenderness, no edema Vascular: 2+ radial pulses bilaterally, 2+ DP pulses bilaterally Neuro: No focal deficits Skin: no rashes Psyc: acting appropriately   ED Results / Procedures / Treatments   Labs (all labs ordered are listed, but only abnormal results are displayed) Labs Reviewed  COMPREHENSIVE METABOLIC PANEL - Abnormal; Notable for the following components:      Result Value   Potassium 3.4 (*)    Glucose, Bld 123 (*)    Calcium 8.5 (*)    Total Protein 6.2 (*)    Albumin 3.3 (*)    All other components within normal limits  CBC WITH DIFFERENTIAL/PLATELET - Abnormal; Notable for the following components:   RBC 3.64 (*)    Hemoglobin 9.8 (*)    HCT 31.1 (*)    RDW 17.2 (*)    All other components within normal limits  SALICYLATE LEVEL - Abnormal; Notable for the following components:   Salicylate Lvl <7.0 (*)    All other components within normal limits  ACETAMINOPHEN LEVEL - Abnormal; Notable for the following components:   Acetaminophen (Tylenol), Serum <10 (*)    All other components within normal limits  ACETAMINOPHEN LEVEL - Abnormal;  Notable for the following components:   Acetaminophen (Tylenol), Serum <10 (*)    All other components within normal limits  RESP PANEL BY RT-PCR (RSV, FLU A&B, COVID)  RVPGX2  ETHANOL  HCG, SERUM, QUALITATIVE  RAPID URINE DRUG SCREEN, HOSP PERFORMED    EKG EKG Interpretation Date/Time:  Tuesday September 02 2023 16:01:11 EST Ventricular Rate:  53 PR Interval:  131 QRS Duration:  87 QT Interval:  435 QTC Calculation: 409 R Axis:   68  Text Interpretation: Sinus rhythm Confirmed by Beckey Downing [phone]) on 09/02/2023 8:23:27 PM  Radiology DG Chest 1 View Result Date: 09/02/2023 CLINICAL DATA:  Cough. EXAM: CHEST  1 VIEW COMPARISON:  07/26/2021  FINDINGS: Lung volumes are low.The cardiomediastinal contours are normal. Pulmonary vasculature is normal. No consolidation, pleural effusion, or pneumothorax. No acute osseous abnormalities are seen. IMPRESSION: Hypoventilatory chest without acute findings. Electronically Signed   By: Narda Rutherford M.D.   On: 09/02/2023 18:56    Procedures Procedures    Medications Ordered in ED Medications  lactated ringers bolus 1,000 mL (0 mLs Intravenous Stopped 09/02/23 1826)  ibuprofen (ADVIL) tablet 200 mg (200 mg Oral Given 09/02/23 2131)    ED Course/ Medical Decision Making/ A&P                                 Medical Decision Making 28 year old female with past medical history of homelessness and suicide attempt in the past presenting to the emergency department today after an apparent intentional overdose.  Initiate a toxicologic workup on the patient.  Initial blood pressures were in the 90s.  On recheck her blood pressures improved to the 110's.  Will give patient IV fluids here.  Will obtain a Tylenol and salicylate level as well as a UDS.  Given her history of suicide attempt and with her admitting to an intentional overdose I will place patient on IVC and have her evaluated by psychiatry.  The patient's labs are reassuring.  Initial EKG shows some bradycardia with normal intervals.  Initial tonsils late levels are low.  The patient's mental status did start to improve.  I did call and discuss her case with poison control.  They recommended a repeat EKG to eval for QRS widening and to obstipation for 8 hours.  The patient remains on an IVC and I will have her speak with psychiatry.  She is medically cleared at this time for psychiatric evaluation as her second EKG does not show any concerning findings.  Second Tylenol level is negative.  Her disposition is pending psychiatry evaluation.  If they feel the patient is stable for discharge I think that she may be discharged but given her history and  indeterminate intent will have him speak with the patient.  CRITICAL CARE Performed by: Durwin Glaze   Total critical care time: 36 minutes  Critical care time was exclusive of separately billable procedures and treating other patients.  Critical care was necessary to treat or prevent imminent or life-threatening deterioration.  Critical care was time spent personally by me on the following activities: development of treatment plan with patient and/or surrogate as well as nursing, discussions with consultants, evaluation of patient's response to treatment, examination of patient, obtaining history from patient or surrogate, ordering and performing treatments and interventions, ordering and review of laboratory studies, ordering and review of radiographic studies, pulse oximetry and re-evaluation of patient's condition.   Amount and/or Complexity of  Data Reviewed Labs: ordered. Radiology: ordered.  Risk OTC drugs.           Final Clinical Impression(s) / ED Diagnoses Final diagnoses:  Overdose of undetermined intent, initial encounter    Rx / DC Orders ED Discharge Orders     None         Durwin Glaze, MD 09/02/23 2357

## 2023-09-02 NOTE — ED Notes (Addendum)
 Pt now Jenna [name]. RN gave pt a sandwich and crackers at this time. Pt states she does not need to urinate.

## 2023-09-02 NOTE — ED Notes (Addendum)
 IVC in process. Waiting on officer to bring the custody order to serve. File # [id number] First Exam and Petition in [place]

## 2023-09-03 ENCOUNTER — Other Ambulatory Visit (HOSPITAL_COMMUNITY)
Admission: EM | Admit: 2023-09-03 | Discharge: 2023-09-06 | Disposition: A | Discharge status: Home / Self Care | Payer: MEDICAID | Source: Home / Self Care | Admitting: Psychiatry

## 2023-09-03 DIAGNOSIS — F17210 Nicotine dependence, cigarettes, uncomplicated: Secondary | ICD-10-CM | POA: Insufficient documentation

## 2023-09-03 DIAGNOSIS — Z79899 Other long term (current) drug therapy: Secondary | ICD-10-CM | POA: Insufficient documentation

## 2023-09-03 DIAGNOSIS — T50901A Poisoning by unspecified drugs, medicaments and biological substances, accidental (unintentional), initial encounter: Secondary | ICD-10-CM | POA: Diagnosis not present

## 2023-09-03 DIAGNOSIS — F1410 Cocaine abuse, uncomplicated: Secondary | ICD-10-CM

## 2023-09-03 DIAGNOSIS — F1994 Other psychoactive substance use, unspecified with psychoactive substance-induced mood disorder: Secondary | ICD-10-CM

## 2023-09-03 DIAGNOSIS — F1290 Cannabis use, unspecified, uncomplicated: Secondary | ICD-10-CM

## 2023-09-03 DIAGNOSIS — Z5900 Homelessness unspecified: Secondary | ICD-10-CM | POA: Insufficient documentation

## 2023-09-03 DIAGNOSIS — F19159 Other psychoactive substance abuse with psychoactive substance-induced psychotic disorder, unspecified: Secondary | ICD-10-CM | POA: Insufficient documentation

## 2023-09-03 DIAGNOSIS — F17200 Nicotine dependence, unspecified, uncomplicated: Secondary | ICD-10-CM

## 2023-09-03 DIAGNOSIS — F39 Unspecified mood [affective] disorder: Secondary | ICD-10-CM | POA: Diagnosis present

## 2023-09-03 DIAGNOSIS — F1590 Other stimulant use, unspecified, uncomplicated: Secondary | ICD-10-CM

## 2023-09-03 DIAGNOSIS — F19959 Other psychoactive substance use, unspecified with psychoactive substance-induced psychotic disorder, unspecified: Secondary | ICD-10-CM

## 2023-09-03 MED ORDER — NICOTINE 21 MG/24HR TD PT24
21.0000 mg | MEDICATED_PATCH | Freq: Every day | TRANSDERMAL | Status: DC
  Administered 2023-09-04 – 2023-09-06 (×2): 21 mg via TRANSDERMAL
  Filled 2023-09-03 (×5): qty 1

## 2023-09-03 MED ORDER — STERILE WATER FOR INJECTION IJ SOLN
INTRAMUSCULAR | Status: AC
Start: 2023-09-03 — End: 2023-09-03
  Administered 2023-09-03: 10 mL
  Filled 2023-09-03: qty 10

## 2023-09-03 MED ORDER — OLANZAPINE 5 MG PO TBDP
10.0000 mg | ORAL_TABLET | Freq: Three times a day (TID) | ORAL | Status: DC | PRN
  Filled 2023-09-03: qty 2

## 2023-09-03 MED ORDER — OLANZAPINE 10 MG IM SOLR
10.0000 mg | Freq: Once | INTRAMUSCULAR | Status: AC | PRN
  Administered 2023-09-03: 10 mg via INTRAMUSCULAR
  Filled 2023-09-03: qty 10

## 2023-09-03 MED ORDER — HYDROXYZINE HCL 25 MG PO TABS: 25.0000 mg | ORAL_TABLET | Freq: Three times a day (TID) | ORAL | Status: DC | PRN

## 2023-09-03 MED ORDER — HYDROXYZINE HCL 25 MG PO TABS
50.0000 mg | ORAL_TABLET | Freq: Three times a day (TID) | ORAL | Status: DC | PRN
  Administered 2023-09-03 – 2023-09-05 (×4): 50 mg via ORAL
  Filled 2023-09-03 (×5): qty 2

## 2023-09-03 MED ORDER — GABAPENTIN 300 MG PO CAPS
300.0000 mg | ORAL_CAPSULE | Freq: Three times a day (TID) | ORAL | Status: DC
  Administered 2023-09-03 – 2023-09-06 (×9): 300 mg via ORAL
  Filled 2023-09-03 (×9): qty 1

## 2023-09-03 MED ORDER — ALUM & MAG HYDROXIDE-SIMETH 200-200-20 MG/5ML PO SUSP: 30.0000 mL | ORAL | Status: DC | PRN

## 2023-09-03 MED ORDER — ACETAMINOPHEN 325 MG PO TABS
650.0000 mg | ORAL_TABLET | Freq: Four times a day (QID) | ORAL | Status: DC | PRN
  Administered 2023-09-03 – 2023-09-05 (×3): 650 mg via ORAL
  Filled 2023-09-03 (×3): qty 2

## 2023-09-03 MED ORDER — MAGNESIUM HYDROXIDE 400 MG/5ML PO SUSP: 30.0000 mL | Freq: Every day | ORAL | Status: DC | PRN

## 2023-09-03 MED ORDER — OLANZAPINE 10 MG IM SOLR: 10.0000 mg | Freq: Three times a day (TID) | INTRAMUSCULAR | Status: DC | PRN

## 2023-09-03 MED ORDER — MIRTAZAPINE 15 MG PO TBDP
15.0000 mg | ORAL_TABLET | Freq: Every day | ORAL | Status: DC
  Administered 2023-09-03 – 2023-09-05 (×3): 15 mg via ORAL
  Filled 2023-09-03 (×3): qty 1

## 2023-09-03 MED ORDER — OLANZAPINE 5 MG PO TBDP
5.0000 mg | ORAL_TABLET | Freq: Three times a day (TID) | ORAL | Status: DC | PRN
  Filled 2023-09-03: qty 1

## 2023-09-03 MED ORDER — OLANZAPINE 10 MG IM SOLR
5.0000 mg | Freq: Three times a day (TID) | INTRAMUSCULAR | Status: DC | PRN
Start: 2023-09-03 — End: 2023-09-06

## 2023-09-03 NOTE — Progress Notes (Signed)
 Pt was transferred from [facility] and admitted to [facility] due to unspecified mood disorder. Pt is IVCed.  Pt is alert and oriented X3 with angry affect. Pt is guarded and irritable. Pt is ambulatory and is oriented to staff/unit. Pt was cooperative with admission process and skin assessment. It was reported that pt had unintentional overdose. Pt denies pain and current SI/HI/AVH, plan or intent. 15 minutes safety checks initiated per order. Staff will monitor for pt's safety.

## 2023-09-03 NOTE — ED Notes (Signed)
 TTS at bedside and psychiatrist currently speaking w/  pt.

## 2023-09-03 NOTE — ED Notes (Signed)
 Called [employer] for transport to [facility]; third in line.  Reported to RN.

## 2023-09-03 NOTE — ED Notes (Signed)
 Pt is in the dayroom watching TV with peers. Pt denies SI/HI/AVH. Pt has no further complain.No acute distress noted. Will continue to monitor for safety and provide support.

## 2023-09-03 NOTE — ED Notes (Signed)
 Pt yelling at staff cursing at staff and  wanting breakfast , breakfast placed at bedside

## 2023-09-03 NOTE — ED Provider Notes (Signed)
  Provider Note MRN:  [id number]  Arrival date & time: 09/03/23    ED Course and Medical Decision Making  Assumed care of patient at sign-out or upon transfer.  Patient with concern for overdose, denies intent of self-harm but history of suicide attempt in the past, awaiting TTS evaluation and recommendations.  Medically cleared after 8-hour medical observation, which was the recommendation of poison control.  Plan is for psych evaluation in the morning.  Signed out to be from provider.  Procedures  Final Clinical Impressions(s) / ED Diagnoses     ICD-10-CM   1. Overdose of undetermined intent, initial encounter  T50.904A       ED Discharge Orders     None       Discharge Instructions   None     Elmer Sow. Pilar Plate, MD [facility] Emergency Medicine [facility] [email or web address]    Sabas Sous, MD 09/03/23 0157

## 2023-09-03 NOTE — ED Notes (Signed)
 Patient is sleeping. Respirations equal and unlabored, skin warm and dry. No change in assessment or acuity. Routine safety checks conducted according to facility protocol. Will continue to monitor for safety.

## 2023-09-03 NOTE — ED Notes (Signed)
 Patient wanded by security at this time-Monique,RN

## 2023-09-03 NOTE — ED Provider Notes (Signed)
 Patient presenting in evening for admission. Full evaluation to follow in AM. Patient reports she was previously taking mirtazapine and it was helping with her sleep and mood but that she has not been taking in recent months. Admits to worsening mood in setting of medication noncompliance. States she takes gabapentin 600 mg once a day for neuropathy and anxiety. Patient denies SI or HI today however guarded and evasive and will not provide a urine drug screen. Patient appears to be minimizing symptoms and was reported in ER to say she had an intentional overdose as well. Furthermore patient has prior psychiatric admissions for intentional overdoses.    Patient is at high acute risk for suicide and meets IVC criteria in setting of intentional OD as well as prior suicide attempts. Second exam completed.  Discussed r/b/a of restarting mirtazapine 15 mg at bedtime and gabapentin 300 mg TID both of which patient reports previously being helpful with depression, anxiety.  -start mirtazapine 15 mg at bedtime -start gabapentin 300 mg TID -start hydroxyzine 50 mg TID-PRN For anxiety -continue nicotine patch 21 mg qdaily

## 2023-09-03 NOTE — ED Provider Notes (Signed)
 Emergency Medicine Observation Re-evaluation Note  Jenna [name] is a 28 y.o. female, seen on rounds today.  Pt initially presented to the ED for complaints of Drug Overdose Currently, the patient is sleeping.  Physical Exam  BP 127/75   Pulse 82   Temp (!) 100.4 F (38 C) (Oral)   Resp 19   SpO2 100%  Physical Exam General: No acute distress Cardiac: Normal rate Lungs: No increased work of breathing Psych: Calm  ED Course / MDM  EKG:EKG Interpretation Date/Time:  Tuesday September 02 2023 21:22:46 EST Ventricular Rate:  86 PR Interval:  146 QRS Duration:  106 QT Interval:  369 QTC Calculation: 442 R Axis:   79  Text Interpretation: Sinus rhythm Confirmed by Kennis Carina [phone]) on 09/03/2023 1:57:01 AM  I have reviewed the labs performed to date as well as medications administered while in observation.  Recent changes in the last 24 hours include patient did have an elevated temperature of 100.4 last night.  Her COVID/flu swab was negative.  Will add on a urinalysis.  Will reassess her temperature this morning..  Plan  Current plan is for psychiatric assessment this morning.Rolan Bucco, MD 09/03/23 [phone]

## 2023-09-03 NOTE — ED Notes (Signed)
 Pt was found in the dayroom crying profusely, writer approached pt to know what is going on and if she wants to talk about it. Pt stated, she is not suppose to here, she never intentionally tried to harm herself, and that she tried buying xanax on the street and was given a wrong medication. After taking the medication, she fele she couldn't breathe and ask a friend to call 911. But when they came, the friend told them she thinks pt wanted to kill herself. According to pt, the staff at the ED has been so rude to her and gave her a shot that she doesn't even know what was given to her, with all that happened, it has make her feels less of a human. Pt reports she is homeless and on drugs so she is trying to get into a rehab. Writer informed pt to also look at the positive side of thing now because, the social worker here can work with her to get a residential treatment placement. Pt reports she get her bags under a tent and scared someone might steal them.

## 2023-09-03 NOTE — ED Notes (Signed)
 IVC is current

## 2023-09-03 NOTE — ED Notes (Signed)
 Report called to Ryta at [facility]

## 2023-09-03 NOTE — ED Notes (Signed)
 Pt offered to shower but refused , pt change scrubs and given mesh underwear and a pad since pt is on her period, breakfast ordered for pt

## 2023-09-03 NOTE — ED Notes (Signed)
 Called staffing office, at this time no sitter to send.

## 2023-09-03 NOTE — ED Notes (Signed)
 Pt left her room and went into the bathroom. I notified pt that she could not lock the restroom door due to being IVC. Pt became very upset and stated, "Who's going to stop me, you sure want." I then proceeded to tell pt that if she attempted to leave that police would have to escort her back. Pt used restroom, but remained uncooperative and verbally anger. She proceeded to her room yelling, "Let me go, bring me my discharge and let me go because I am leaving." Security at bedside attempting to deescalate the situation, but pt continued to be verbally aggressive and demanding towards medical staff. IM Zyprexa was given. Room cleared and belongings locked up.
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
## 2023-09-03 NOTE — ED Notes (Signed)
 Pt belongings given to PD transporting pt to [facility].

## 2023-09-04 LAB — RAPID URINE DRUG SCREEN, HOSP PERFORMED
Amphetamines: NOT DETECTED
Barbiturates: NOT DETECTED
Benzodiazepines: NOT DETECTED
Cocaine: POSITIVE — AB
Opiates: NOT DETECTED
Tetrahydrocannabinol: POSITIVE — AB

## 2023-09-04 LAB — URINALYSIS, W/ REFLEX TO CULTURE (INFECTION SUSPECTED)
Bilirubin Urine: NEGATIVE
Glucose, UA: NEGATIVE mg/dL
Ketones, ur: NEGATIVE mg/dL
Leukocytes,Ua: NEGATIVE
Nitrite: NEGATIVE
Protein, ur: NEGATIVE mg/dL
RBC / HPF: >50 RBC/hpf (ref 0–5)
Specific Gravity, Urine: 1.018 (ref 1.005–1.030)
pH: 7 (ref 5.0–8.0)

## 2023-09-04 LAB — IRON AND TIBC
Iron: 48 ug/dL (ref 28–170)
Saturation Ratios: 10 % — ABNORMAL LOW (ref 10.4–31.8)
TIBC: 473 ug/dL — ABNORMAL HIGH (ref 250–450)
UIBC: 425 ug/dL

## 2023-09-04 LAB — FERRITIN: Ferritin: 4 ng/mL — ABNORMAL LOW (ref 11–307)

## 2023-09-04 MED ORDER — QUETIAPINE FUMARATE 50 MG PO TABS
50.0000 mg | ORAL_TABLET | Freq: Once | ORAL | Status: AC
  Administered 2023-09-04: 50 mg via ORAL
  Filled 2023-09-04: qty 1

## 2023-09-04 MED ORDER — ONDANSETRON 4 MG PO TBDP: 4.0000 mg | ORAL_TABLET | Freq: Four times a day (QID) | ORAL | Status: DC | PRN

## 2023-09-04 MED ORDER — QUETIAPINE FUMARATE 50 MG PO TABS
50.0000 mg | ORAL_TABLET | Freq: Four times a day (QID) | ORAL | Status: DC | PRN
  Administered 2023-09-05: 50 mg via ORAL
  Filled 2023-09-04: qty 1

## 2023-09-04 MED ORDER — QUETIAPINE FUMARATE 50 MG PO TABS: 50.0000 mg | ORAL_TABLET | Freq: Once | ORAL | Status: DC

## 2023-09-04 MED ORDER — METHOCARBAMOL 500 MG PO TABS: 500.0000 mg | ORAL_TABLET | Freq: Three times a day (TID) | ORAL | Status: DC | PRN

## 2023-09-04 MED ORDER — NAPROXEN 500 MG PO TABS: 500.0000 mg | ORAL_TABLET | Freq: Two times a day (BID) | ORAL | Status: DC | PRN

## 2023-09-04 MED ORDER — LOPERAMIDE HCL 2 MG PO CAPS: 2.0000 mg | ORAL_CAPSULE | ORAL | Status: DC | PRN

## 2023-09-04 MED ORDER — DICYCLOMINE HCL 20 MG PO TABS: 20.0000 mg | ORAL_TABLET | Freq: Four times a day (QID) | ORAL | Status: DC | PRN

## 2023-09-04 MED ORDER — QUETIAPINE FUMARATE 50 MG PO TABS
50.0000 mg | ORAL_TABLET | Freq: Every day | ORAL | Status: DC
  Administered 2023-09-04: 50 mg via ORAL
  Filled 2023-09-04: qty 1

## 2023-09-04 NOTE — Group Note (Signed)
 Group Topic: Wellness  Group Date: 09/04/2023 Start Time: 1000 End Time: 1030 Facilitators: Londell Moh, NT  Department: [facility]  Number of Participants: 10  Group Focus: check in, concentration, and daily focus Treatment Modality:  Psychoeducation Interventions utilized were patient education Purpose: enhance coping skills, express feelings, and increase insight  Name: Jenna [name] Date of Birth: September 22, 1995  MR: [id number]    Level of Participation: moderate Quality of Participation: attentive Interactions with others: gave feedback Mood/Affect: appropriate Triggers (if applicable): n/a Cognition: coherent/clear Progress: Moderate Response: Pt was able to share goals for the day and express understanding towards rules and expectations of the fbc unit. Plan: patient will be encouraged to attend future groups.  Patients Problems:  Patient Active Problem List   Diagnosis Date Noted   Unspecified mood (affective) disorder (HCC) 09/03/2023   MDD (major depressive disorder), recurrent episode, severe (HCC) 02/02/2022   Cocaine abuse (HCC) 02/02/2022   Alcohol abuse 02/02/2022   Cannabis abuse 02/02/2022   Encounter for sterilization 01/02/2018   Postpartum care following cesarean delivery 01/02/2018   Supervision of high risk pregnancy, antepartum 10/08/2017   Atypical squamous cells cannot exclude high grade squamous intraepithelial lesion on cytologic smear of cervix (ASC-H) 09/17/2017   Anemia in pregnancy 09/17/2017   Encounter for supervision of normal pregnancy, unspecified, unspecified trimester 09/03/2017   History of cesarean section 09/03/2017   Late prenatal care 09/03/2017   Post-partum depression 04/03/2017   MDD (major depressive disorder), recurrent severe, without psychosis (HCC) 04/02/2017   History of pelvic fracture 01/28/2017   Social problem 01/02/2017   Tobacco smoking affecting pregnancy, antepartum 10/27/2016

## 2023-09-04 NOTE — ED Notes (Signed)
 Patient is sleeping. Respirations equal and unlabored, skin warm and dry. No change in assessment or acuity. Routine safety checks conducted according to facility protocol. Will continue to monitor for safety.

## 2023-09-04 NOTE — ED Notes (Signed)
 Patient is seen sitting in the milieu area interacting with peers without any distress noted. Staff will continue to monitor safety and for changes in condition.

## 2023-09-04 NOTE — ED Notes (Signed)
 Pt was provided lunch

## 2023-09-04 NOTE — Group Note (Signed)
 Group Topic: Wellness  Group Date: 09/04/2023 Start Time: 1145 End Time: 1230 Facilitators: Londell Moh, NT  Department: [facility]  Number of Participants: 10  Group Focus: Nutrition Treatment Modality:  Psychoeducation Interventions utilized were patient education Purpose: increase insight  Name: Jenna [name] Date of Birth: [birth year]  MR: [id number]    Level of Participation: active Quality of Participation: attentive Interactions with others: gave feedback Mood/Affect: appropriate Triggers (if applicable): n/a Cognition: coherent/clear Progress: Gaining insight Response: n/a Plan: patient will be encouraged to attend future groups.  Patients Problems:  Patient Active Problem List   Diagnosis Date Noted   Unspecified mood (affective) disorder (HCC) 09/03/2023   MDD (major depressive disorder), recurrent episode, severe (HCC) 02/02/2022   Cocaine abuse (HCC) 02/02/2022   Alcohol abuse 02/02/2022   Cannabis abuse 02/02/2022   Encounter for sterilization 01/02/2018   Postpartum care following cesarean delivery 01/02/2018   Supervision of high risk pregnancy, antepartum 10/08/2017   Atypical squamous cells cannot exclude high grade squamous intraepithelial lesion on cytologic smear of cervix (ASC-H) 09/17/2017   Anemia in pregnancy 09/17/2017   Encounter for supervision of normal pregnancy, unspecified, unspecified trimester 09/03/2017   History of cesarean section 09/03/2017   Late prenatal care 09/03/2017   Post-partum depression 04/03/2017   MDD (major depressive disorder), recurrent severe, without psychosis (HCC) 04/02/2017   History of pelvic fracture 01/28/2017   Social problem 01/02/2017   Tobacco smoking affecting pregnancy, antepartum 10/27/2016

## 2023-09-04 NOTE — ED Notes (Signed)
 Pt was provided breakfast.

## 2023-09-04 NOTE — ED Notes (Signed)
 Pt was provided dinner.

## 2023-09-04 NOTE — Group Note (Signed)
 Group Topic: Feelings about Diagnosis  Group Date: 09/04/2023 Start Time: 1120 End Time: 1145 Facilitators: Jenean Lindau, RN  Department: [facility]  Number of Participants: 10  Group Focus: abuse issues, acceptance, affirmation, anger management, anxiety, chemical dependency education, and coping skills Treatment Modality:  Behavior Modification Therapy Interventions utilized were clarification, confrontation, exploration, and group exercise Purpose: enhance coping skills, explore maladaptive thinking, express feelings, express irrational fears, improve communication skills, increase insight, regain self-worth, reinforce self-care, and relapse prevention strategies  Name: Jenna [name] Date of Birth: 05-15-1996  MR: [id number]    Level of Participation:  Quality of Participation:  Interactions with others:  Mood/Affect:  Triggers (if applicable):  Cognition:  Progress:  Response:  Plan:   Patients Problems:  Patient Active Problem List   Diagnosis Date Noted   Unspecified mood (affective) disorder (HCC) 09/03/2023   MDD (major depressive disorder), recurrent episode, severe (HCC) 02/02/2022   Cocaine abuse (HCC) 02/02/2022   Alcohol abuse 02/02/2022   Cannabis abuse 02/02/2022   Encounter for sterilization 01/02/2018   Postpartum care following cesarean delivery 01/02/2018   Supervision of high risk pregnancy, antepartum 10/08/2017   Atypical squamous cells cannot exclude high grade squamous intraepithelial lesion on cytologic smear of cervix (ASC-H) 09/17/2017   Anemia in pregnancy 09/17/2017   Encounter for supervision of normal pregnancy, unspecified, unspecified trimester 09/03/2017   History of cesarean section 09/03/2017   Late prenatal care 09/03/2017   Post-partum depression 04/03/2017   MDD (major depressive disorder), recurrent severe, without psychosis (HCC) 04/02/2017   History of pelvic fracture 01/28/2017   Social problem  01/02/2017   Tobacco smoking affecting pregnancy, antepartum 10/27/2016

## 2023-09-04 NOTE — ED Provider Notes (Addendum)
 Facility Based Crisis Admission H&P  Date: 09/04/23 Patient Name: Jenna [name] MRN: [id number] Chief Complaint: Depression, cocaine use  Diagnoses:  Final diagnoses:  Stimulant use disorder  Cocaine abuse (HCC)  Substance induced mood disorder (HCC)  Psychoactive substance-induced psychosis (HCC)  Tobacco use disorder  Cannabis use disorder    HPI:  Jenna [name] is a 28 yo female with a past psychiatric history of MDD, polysubstance abuse (cocaine, alcohol, cannabis), equivocal bipolar affective disorder history, and prior psychiatric hospitalizations (last at [facility] in 2023).  The patient arrived to [facility] ED on 09/02/2023 via EMS for concerns of attempted overdose on Xanax was IVC.  She was medically stabilized and discharged on 09/03/2023.  Patient presented to [facility] on same day for crisis stabilization, with patient reporting worsening mood in the setting of medication noncompliance and ongoing substance use.  UDS obtained on admission is positive for cocaine and THC.  On assessment with the treatment team this morning, patient is reporting she is currently homeless and has been staying at the [facility].  She attempted to purchase Xanax off of the street in order to address symptoms of anxiety and self-reported psychosis that she was experiencing at that time.  She reports that when she took the medication she began to feel unwell and EMS was called, patient reports that at no point did she experience suicidal ideations.  She denies that the taking of the Xanax was an attempt to take her life.  She denies any suicidal ideations today.  She reports that she did experience suicidal ideation 6 years ago when her son was critically ill.  She does not agree with her IVC, but reports that this hospitalization may be a way to address some concerns she has about her mental health.  Patient reports she believes she may have schizophrenia, she often finds herself talking to herself.  She denies any auditory or  visual hallucinations.  She does report concurrent substance use, including stimulants such as Cocaine, as well as using cannabis daily.  She reports no recent period of sobriety and identifies fluctuations in her mental status while under the influence of the substances.  Patient is perseverative and concerned about her belongings after being picked up by EMS, reports that she believes her belongings are currently with her mother.  Ultimately she reports that she wants to detox and complete this hospitalization in order to be stabilized on medications.  She reports she has been approved for housing via Child psychotherapist at [facility] who has been working with her.  Patient is also perseverative about her treatment at [facility] [place] [facility], where she reports that she became verbally agitated and required IM agitation protocol.   Interim note Was informed by RN that patient was observed on the phone, speaking to a female, who she was threatening to harm if he took her belongings.  Substance Use Hx: Tobacco: vaping and cigarettes for the past 4 years. Smokes about 6 cigarretes  Cannabis:smoking since age 74, blunts/edibles/weed pens. Last use prior to admission Cocaine: using since [age] yo, uses daily, intransal use. 1 gram daily.  Opioids: reports using perocets intermittently  Past Psychiatric Hx: Current psych meds: Denies Dx: pt reports she has bene previously diagnosed with severe anxiety  Prior psych meds: Remeron, hydroxyzine, gabapentin  Outpatient: Denies  Inpatient: Multiple  Non-suicidal self injury: Patient denies  Suicide attempts: Patient denies. Multiple suicide attempts by medication overdose Violence: Denies  Drugs/alcohol: Per chart review cocaine, alcohol, cannabis. Patient denies.  Trauma/neglect/abuse: Yes  Past Medical History: No current PCP follow-up.  No chronic medical conditions or current medications.  Allergies include a cyclobenzaprine and Haldol  Social History: Homeless, staying at  [facility]. Patient reports she has a son,   PHQ 2-9:  Flowsheet Row ED from 09/03/2023 in [facility]  Thoughts that you would be better off dead, or of hurting yourself in some way Not at all  PHQ-9 Total Score 7       Flowsheet Row ED from 09/03/2023 in [facility] ED from 09/02/2023 in [facility] at [facility] ED from 04/22/2023 in [facility] Emergency Department at [facility]  C-SSRS RISK CATEGORY No Risk No Risk No Risk         Total Time spent with patient: 1.5 hours  Musculoskeletal  Strength & Muscle Tone: within normal limits Gait & Station: normal Patient leans: N/A  Psychiatric Specialty Exam  Presentation General Appearance: Disheveled  Eye Contact:Fair  Speech:Clear and Coherent; Pressured  Speech Volume:Normal  Handedness:not assessed  Mood and Affect  Mood:Irritable  Affect:Congruent; Full Range   Thought Process  Thought Processes:Linear  Descriptions of Associations:Tangential  Orientation:None  Thought Content:Logical; Perseveration    Hallucinations:Hallucinations: None  Ideas of Reference:None  Suicidal Thoughts:Suicidal Thoughts: No  Homicidal Thoughts:Homicidal Thoughts: No   Sensorium  Memory:Immediate Good; Recent Good; Remote Good  Judgment:Poor  Insight:Poor   Executive Functions  Concentration:Fair  Attention Span:Fair  Recall:Good; Fair  Fund of Knowledge:Fair  Language:Fair   Psychomotor Activity  Psychomotor Activity:Psychomotor Activity: Normal   Assets  Assets:Desire for Improvement; Resilience; Communication Skills   Sleep  Sleep:Sleep: Fair     Physical Exam Vitals and nursing note reviewed.  Constitutional:      General: She is not in acute distress.    Appearance: She is not ill-appearing.  HENT:     Head: Normocephalic and atraumatic.  Pulmonary:     Effort: Pulmonary effort is normal. No  respiratory distress.  Musculoskeletal:        General: Normal range of motion.  Skin:    General: Skin is warm and dry.    Review of Systems  All other systems reviewed and are negative.   Blood pressure (!) 146/90, pulse (!) 59, temperature 98.2 F (36.8 C), temperature source Oral, resp. rate 18, SpO2 99%. There is no height or weight on file to calculate BMI.   Last Labs:  Admission on 09/03/2023  Component Date Value Ref Range Status   Opiates 09/04/2023 NONE DETECTED  NONE DETECTED Final   Cocaine 09/04/2023 POSITIVE (A)  NONE DETECTED Final   Benzodiazepines 09/04/2023 NONE DETECTED  NONE DETECTED Final   Amphetamines 09/04/2023 NONE DETECTED  NONE DETECTED Final   Tetrahydrocannabinol 09/04/2023 POSITIVE (A)  NONE DETECTED Final   Barbiturates 09/04/2023 NONE DETECTED  NONE DETECTED Final   Comment: (NOTE) DRUG SCREEN FOR MEDICAL PURPOSES ONLY.  IF CONFIRMATION IS NEEDED FOR ANY PURPOSE, NOTIFY LAB WITHIN 5 DAYS.  LOWEST DETECTABLE LIMITS FOR URINE DRUG SCREEN Drug Class                     Cutoff (ng/mL) Amphetamine and metabolites    1000 Barbiturate and metabolites    200 Benzodiazepine                 200 Opiates and metabolites        300 Cocaine and metabolites        300 THC  50 Performed at [facility], [address], [place], [place] [address]    Specimen Source 09/04/2023 URINE, CLEAN CATCH   Final   Color, Urine 09/04/2023 YELLOW  YELLOW Final   APPearance 09/04/2023 HAZY (A)  CLEAR Final   Specific Gravity, Urine 09/04/2023 1.018  1.005 - 1.030 Final   pH 09/04/2023 7.0  5.0 - 8.0 Final   Glucose, UA 09/04/2023 NEGATIVE  NEGATIVE mg/dL Final   Hgb urine dipstick 09/04/2023 LARGE (A)  NEGATIVE Final   Bilirubin Urine 09/04/2023 NEGATIVE  NEGATIVE Final   Ketones, ur 09/04/2023 NEGATIVE  NEGATIVE mg/dL Final   Protein, ur 46/96/2952 NEGATIVE  NEGATIVE mg/dL Final   Nitrite 84/13/2440 NEGATIVE  NEGATIVE Final    Leukocytes,Ua 09/04/2023 NEGATIVE  NEGATIVE Final   RBC / HPF 09/04/2023 >50  0 - 5 RBC/hpf Final   WBC, UA 09/04/2023 6-10  0 - 5 WBC/hpf Final   Comment:        Reflex urine culture not performed if WBC <=10, OR if Squamous epithelial cells >5. If Squamous epithelial cells >5 suggest recollection.    Bacteria, UA 09/04/2023 RARE (A)  NONE SEEN Final   Squamous Epithelial / HPF 09/04/2023 0-5  0 - 5 /HPF Final   Mucus 09/04/2023 PRESENT   Final   Performed at [facility], [address] [place], [place] [address]  Admission on 09/02/2023, Discharged on 09/03/2023  Component Date Value Ref Range Status   Sodium 09/02/2023 142  135 - 145 mmol/L Final   Potassium 09/02/2023 3.4 (L)  3.5 - 5.1 mmol/L Final   Chloride 09/02/2023 106  98 - 111 mmol/L Final   CO2 09/02/2023 23  22 - 32 mmol/L Final   Glucose, Bld 09/02/2023 123 (H)  70 - 99 mg/dL Final   Glucose reference range applies only to samples taken after fasting for at least 8 hours.   BUN 09/02/2023 7  6 - 20 mg/dL Final   Creatinine, Ser 09/02/2023 0.96  0.44 - 1.00 mg/dL Final   Calcium 53/66/4403 8.5 (L)  8.9 - 10.3 mg/dL Final   Total Protein 47/42/5956 6.2 (L)  6.5 - 8.1 g/dL Final   Albumin 38/75/6433 3.3 (L)  3.5 - 5.0 g/dL Final   AST 29/51/8841 27  15 - 41 U/L Final   ALT 09/02/2023 19  0 - 44 U/L Final   Alkaline Phosphatase 09/02/2023 41  38 - 126 U/L Final   Total Bilirubin 09/02/2023 0.4  0.0 - 1.2 mg/dL Final   GFR, Estimated 09/02/2023 >60  >60 mL/min Final   Comment: (NOTE) Calculated using the CKD-EPI Creatinine Equation (2021)    Anion gap 09/02/2023 13  5 - 15 Final   Performed at [facility], [address] [place], [place] [address]   Alcohol, Ethyl (B) 09/02/2023 <10  <10 mg/dL Final   Comment: (NOTE) Lowest detectable limit for serum alcohol is 10 mg/dL.  For medical purposes only. Performed at [facility], [address] [place], [place] [address]    WBC 09/02/2023 7.2   4.0 - 10.5 K/uL Final   RBC 09/02/2023 3.64 (L)  3.87 - 5.11 MIL/uL Final   Hemoglobin 09/02/2023 9.8 (L)  12.0 - 15.0 g/dL Final   HCT 09/32/3557 31.1 (L)  36.0 - 46.0 % Final   MCV 09/02/2023 85.4  80.0 - 100.0 fL Final   MCH 09/02/2023 26.9  26.0 - 34.0 pg Final   MCHC 09/02/2023 31.5  30.0 - 36.0 g/dL Final   RDW 16/04/9603 17.2 (H)  11.5 - 15.5 % Final   Platelets 09/02/2023 281  150 - 400 K/uL Final   nRBC 09/02/2023 0.0  0.0 - 0.2 % Final   Neutrophils Relative % 09/02/2023 62  % Final   Neutro Abs 09/02/2023 4.5  1.7 - 7.7 K/uL Final   Lymphocytes Relative 09/02/2023 27  % Final   Lymphs Abs 09/02/2023 1.9  0.7 - 4.0 K/uL Final   Monocytes Relative 09/02/2023 7  % Final   Monocytes Absolute 09/02/2023 0.5  0.1 - 1.0 K/uL Final   Eosinophils Relative 09/02/2023 3  % Final   Eosinophils Absolute 09/02/2023 0.2  0.0 - 0.5 K/uL Final   Basophils Relative 09/02/2023 1  % Final   Basophils Absolute 09/02/2023 0.1  0.0 - 0.1 K/uL Final   Immature Granulocytes 09/02/2023 0  % Final   Abs Immature Granulocytes 09/02/2023 0.03  0.00 - 0.07 K/uL Final   Performed at [facility], [address], [place], [place] [address]   Preg, Serum 09/02/2023 NEGATIVE  NEGATIVE Final   Comment:        THE SENSITIVITY OF THIS METHODOLOGY IS >10 mIU/mL. Performed at [facility], [address] [place], [place] [address]    Salicylate Lvl 09/02/2023 <7.0 (L)  7.0 - 30.0 mg/dL Final   Performed at [facility], [address] [place], [place] [address]   Acetaminophen (Tylenol), Serum 09/02/2023 <10 (L)  10 - 30 ug/mL Final   Comment: (NOTE) Therapeutic concentrations vary significantly. A range of 10-30 ug/mL  may be an effective concentration for many patients. However, some  are best treated at concentrations outside of this range. Acetaminophen concentrations >150 ug/mL at 4 hours after ingestion  and >50 ug/mL at 12 hours after ingestion are often associated with  toxic  reactions.  Performed at [facility], [address], [place], [place] [address]    SARS Coronavirus 2 by RT PCR 09/02/2023 NEGATIVE  NEGATIVE Final   Influenza A by PCR 09/02/2023 NEGATIVE  NEGATIVE Final   Influenza B by PCR 09/02/2023 NEGATIVE  NEGATIVE Final   Comment: (NOTE) The Xpert Xpress SARS-CoV-2/FLU/RSV plus assay is intended as an aid in the diagnosis of influenza from Nasopharyngeal swab specimens and should not be used as a sole basis for treatment. Nasal washings and aspirates are unacceptable for Xpert Xpress SARS-CoV-2/FLU/RSV testing.  Fact Sheet for Patients: BloggerCourse.com  Fact Sheet for Healthcare Providers: SeriousBroker.it  This test is not yet approved or cleared by the [place] FDA and has been authorized for detection and/or diagnosis of SARS-CoV-2 by FDA under an Emergency Use Authorization (EUA). This EUA will remain in effect (meaning this test can be used) for the duration of the COVID-19 declaration under Section 564(b)(1) of the Act, 21 U.S.C. section 360bbb-3(b)(1), unless the authorization is terminated or revoked.     Resp Syncytial Virus by PCR 09/02/2023 NEGATIVE  NEGATIVE Final   Comment: (NOTE) Fact Sheet for Patients: BloggerCourse.com  Fact Sheet for Healthcare Providers: SeriousBroker.it  This test is not yet approved or cleared by the [place] FDA and has been authorized for detection and/or diagnosis of SARS-CoV-2 by FDA under an Emergency Use Authorization (EUA). This EUA will remain in effect (meaning this test can be used) for the duration of the COVID-19 declaration under Section 564(b)(1) of the Act, 21 U.S.C. section 360bbb-3(b)(1), unless the authorization is terminated or revoked.  Performed at [facility], [address], [place], [place] [address]    Acetaminophen (Tylenol), Serum  09/02/2023 <10 (L)  10 - 30 ug/mL Final   Comment: (NOTE) Therapeutic concentrations vary significantly. A range of 10-30 ug/mL  may be an effective concentration for many patients. However, some  are best treated at concentrations outside of this range. Acetaminophen concentrations >150 ug/mL at 4 hours after ingestion  and >50 ug/mL at 12 hours after ingestion are often associated with  toxic reactions.  Performed at [facility], [address], [place], [place] [address]   Orders Only on 07/28/2023  Component Date Value Ref Range Status   Atopobium vaginae 07/28/2023 High - 2 (A)  Score Final   BVAB 2 07/28/2023 High - 2 (A)  Score Final   Megasphaera 1 07/28/2023 High - 2 (A)  Score Final   Comment: Calculate total score by adding the 3 individual bacterial vaginosis (BV) marker scores together.  Total score is interpreted as follows: Total score 0-1: Indicates the absence of BV. Total score   2: Indeterminate for BV. Additional clinical                  data should be evaluated to establish a                  diagnosis. Total score 3-6: Indicates the presence of BV.    Candida albicans, NAA 07/28/2023 Negative  Negative Final   Candida glabrata, NAA 07/28/2023 Negative  Negative Final   Chlamydia trachomatis, NAA 07/28/2023 Negative  Negative Final   Neisseria Gonorrhoeae by PCR 07/28/2023 Negative  Negative Final   specimen status report 07/28/2023 Comment   Final   Comment: Written Authorization Written Authorization Written Authorization Received. Authorization received from ORIGINAL REQ 07-30-2023 Logged by Adrienne Mocha   Office Visit on 07/28/2023  Component Date Value Ref Range Status   HIV Screen 4th Generation wRfx 07/28/2023 Non Reactive  Non Reactive Final   Comment: HIV-1/HIV-2 antibodies and HIV-1 p24 antigen were NOT detected. There is no laboratory evidence of HIV infection. HIV Negative    RPR Ser Ql 07/28/2023 Non Reactive  Non Reactive Final    Glucose 07/28/2023 75  70 - 99 mg/dL Final   BUN 86/57/8469 11  6 - 20 mg/dL Final   Creatinine, Ser 07/28/2023 0.76  0.57 - 1.00 mg/dL Final   eGFR 62/95/2841 110  >59 mL/min/1.73 Final   BUN/Creatinine Ratio 07/28/2023 14  9 - 23 Final   Sodium 07/28/2023 141  134 - 144 mmol/L Final   Potassium 07/28/2023 5.0  3.5 - 5.2 mmol/L Final   Chloride 07/28/2023 101  96 - 106 mmol/L Final   Calcium 07/28/2023 9.5  8.7 - 10.2 mg/dL Final   Total Protein 32/44/0102 7.3  6.0 - 8.5 g/dL Final   Albumin 72/53/6644 4.2  4.0 - 5.0 g/dL Final   Globulin, Total 07/28/2023 3.1  1.5 - 4.5 g/dL Final   Bilirubin Total 07/28/2023 <0.2  0.0 - 1.2 mg/dL Final   Alkaline Phosphatase 07/28/2023 64  44 - 121 IU/L Final   AST 07/28/2023 20  0 - 40 IU/L Final  Admission on 04/22/2023, Discharged on 04/22/2023  Component Date Value Ref Range Status   Glucose-Capillary 04/22/2023 80  70 - 99 mg/dL Final   Glucose reference range applies only to samples taken after fasting for at least 8 hours.   Sodium 04/22/2023 136  135 - 145 mmol/L Final  Potassium 04/22/2023 4.0  3.5 - 5.1 mmol/L Final   Chloride 04/22/2023 107  98 - 111 mmol/L Final   CO2 04/22/2023 23  22 - 32 mmol/L Final   Glucose, Bld 04/22/2023 83  70 - 99 mg/dL Final   Glucose reference range applies only to samples taken after fasting for at least 8 hours.   BUN 04/22/2023 14  6 - 20 mg/dL Final   Creatinine, Ser 04/22/2023 0.69  0.44 - 1.00 mg/dL Final   Calcium 16/04/9603 8.3 (L)  8.9 - 10.3 mg/dL Final   GFR, Estimated 04/22/2023 >60  >60 mL/min Final   Comment: (NOTE) Calculated using the CKD-EPI Creatinine Equation (2021)    Anion gap 04/22/2023 6  5 - 15 Final   Performed at [facility], [address] [place], [place] [address]   WBC 04/22/2023 9.6  4.0 - 10.5 K/uL Final   RBC 04/22/2023 4.39  3.87 - 5.11 MIL/uL Final   Hemoglobin 04/22/2023 12.1  12.0 - 15.0 g/dL Final   HCT 11/91/4782 38.0  36.0 - 46.0 % Final    MCV 04/22/2023 86.6  80.0 - 100.0 fL Final   MCH 04/22/2023 27.6  26.0 - 34.0 pg Final   MCHC 04/22/2023 31.8  30.0 - 36.0 g/dL Final   RDW 95/62/1308 17.3 (H)  11.5 - 15.5 % Final   Platelets 04/22/2023 304  150 - 400 K/uL Final   nRBC 04/22/2023 0.0  0.0 - 0.2 % Final   Neutrophils Relative % 04/22/2023 71  % Final   Neutro Abs 04/22/2023 6.8  1.7 - 7.7 K/uL Final   Lymphocytes Relative 04/22/2023 19  % Final   Lymphs Abs 04/22/2023 1.8  0.7 - 4.0 K/uL Final   Monocytes Relative 04/22/2023 8  % Final   Monocytes Absolute 04/22/2023 0.7  0.1 - 1.0 K/uL Final   Eosinophils Relative 04/22/2023 2  % Final   Eosinophils Absolute 04/22/2023 0.2  0.0 - 0.5 K/uL Final   Basophils Relative 04/22/2023 0  % Final   Basophils Absolute 04/22/2023 0.0  0.0 - 0.1 K/uL Final   Immature Granulocytes 04/22/2023 0  % Final   Abs Immature Granulocytes 04/22/2023 0.04  0.00 - 0.07 K/uL Final   Performed at [facility], [address], [place], [place] [address]   Preg, Serum 04/22/2023 NEGATIVE  NEGATIVE Final   Comment:        THE SENSITIVITY OF THIS METHODOLOGY IS >10 mIU/mL. Performed at [facility], [address] [place], [place] [address]     Allergies: Cyclobenzaprine and Haldol [haloperidol]  Medications:  Facility Ordered Medications  Medication   [COMPLETED] OLANZapine (ZYPREXA) injection 10 mg   [COMPLETED] sterile water (preservative free) injection   acetaminophen (TYLENOL) tablet 650 mg   alum & mag hydroxide-simeth (MAALOX/MYLANTA) 200-200-20 MG/5ML suspension 30 mL   magnesium hydroxide (MILK OF MAGNESIA) suspension 30 mL   OLANZapine zydis (ZYPREXA) disintegrating tablet 5 mg   OLANZapine (ZYPREXA) injection 5 mg   OLANZapine (ZYPREXA) injection 10 mg   nicotine (NICODERM CQ - dosed in mg/24 hours) patch 21 mg   mirtazapine (REMERON SOL-TAB) disintegrating tablet 15 mg   hydrOXYzine (ATARAX) tablet 50 mg   gabapentin (NEURONTIN)  capsule 300 mg   dicyclomine (BENTYL) tablet 20 mg   loperamide (IMODIUM) capsule 2-4 mg   methocarbamol (ROBAXIN) tablet 500 mg   naproxen (NAPROSYN) tablet 500 mg   ondansetron (ZOFRAN-ODT) disintegrating tablet 4 mg   PTA Medications  Medication Sig   gabapentin (NEURONTIN) 100 MG capsule Take 1 capsule (100 mg total) by mouth 2 (two) times daily. (Patient not taking: Reported on 09/02/2023)   ibuprofen (ADVIL) 800 MG tablet Take 1 tablet (800 mg total) by mouth every 8 (eight) hours as needed. (Patient not taking: Reported on 09/02/2023)   metroNIDAZOLE (FLAGYL) 500 MG tablet Take 1 tablet (500 mg total) by mouth 2 (two) times daily. (Patient not taking: Reported on 09/02/2023)    Long Term Goals: Improvement in symptoms so as ready for discharge  Short Term Goals: Patient will verbalize feelings in meetings with treatment team members. and Patient will attend at least of 50% of the groups daily.  Medical Decision Making  Status: under IVC; second exam completed   Psychiatric Diagnoses and Treatment:  Substance-induced psychosis Stimulant use disorder, cocaine type Continue Remeron 50 mg nightly Continue gabapentin 300 mg 3 times daily, medication primarily given for neuropathy with added benefit of addressing anxiety, and withdrawal induced  Tobacco use disorder Smoking cessation encouraged Nicotine patch 21 mg daily  Cannabis use disorder Cessation encouraged Supportive PRNs   Medical Issues Being Addressed:   Workup of anemia TIBC/ferritin pending  Other PRNs: acetaminophen, 650 mg, Q6H PRN alum & mag hydroxide-simeth, 30 mL, Q4H PRN dicyclomine, 20 mg, Q6H PRN hydrOXYzine, 50 mg, TID PRN loperamide, 2-4 mg, PRN magnesium hydroxide, 30 mL, Daily PRN methocarbamol, 500 mg, Q8H PRN naproxen, 500 mg, BID PRN OLANZapine, 10 mg, TID PRN OLANZapine, 5 mg, TID PRN OLANZapine zydis, 5 mg, TID PRN ondansetron, 4 mg, Q6H PRN    Other Labs/Imaging Reviewed: UDS  positive for cocaine and THC UA showing bacteria COVID-negative Acetaminophen and salicylate levels are negative for toxicity Serum hCG is negative CBC showing normocytic anemia with hemoglobin 9.8  EKG on 09/03/2023: QTc 409   Disposition: TBD  Recommendations  Based on my evaluation the patient does not appear to have an emergency medical condition.  Lorri Frederick, MD 09/04/23  12:50 PM
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
## 2023-09-04 NOTE — BH Assessment (Signed)
 LCSW, MD, and Resident met with patient to assess current mood, affect, physical state, and inquire about needs/goals while here in [facility] and after discharge. Patient reports she presented due to being homeless at the [facility]. Patient reports she took what she thought was Xanax off the streets however started to experience some psychosis. Patient reports she ended up getting on the bus and then having an episode of emesis where others were attempting to wake her up and move her, however she informed someone to call EMS for help. Patient reports presenting to [facility] where she later was IVC'd by the staff after having an outburst. Patient reports she has been working with her Case Chartered certified accountant at the [facility] for her own housing, and reports she has been enrolled in a program for a [employer] in order to secure that housing. Patient does admit to cocaine, alcohol, and marijuana use. Patient reports no recent period of sobriety and identifies fluctuations in her mental status while under the influence of the substances. Patient reports her goal is to get stabilized on her medication and then follow back up with her Case Worker to continue working towards her home. Patient provided LCSW with permission to speak with her Case Worker for further information. Patient to retrieve number from her cellphone and staff made aware. No other needs to report at this time.   LCSW will continue to follow and provide support to patient while on [facility] unit.   Fernande Boyden, LCSW Clinical Social Worker [place] [facility] Ph: [phone]

## 2023-09-05 MED ORDER — QUETIAPINE FUMARATE 50 MG PO TABS
50.0000 mg | ORAL_TABLET | Freq: Two times a day (BID) | ORAL | Status: DC
  Administered 2023-09-05 – 2023-09-06 (×2): 50 mg via ORAL
  Filled 2023-09-05 (×2): qty 1

## 2023-09-05 NOTE — ED Notes (Signed)
 Patient is resting in bed with eyes closed without any distress noted. Staff will continue to monitor safety and for changes in condition.

## 2023-09-05 NOTE — ED Notes (Signed)
 Pt irritable and agitated after she requested for bed time medicine and RN responded will be given later.

## 2023-09-05 NOTE — [facility] Group Notes (Signed)
 SPIRITUALITY GROUP NOTE  Spirituality group facilitated by Wilkie Aye, MDiv, [facility].  Group Description: Group focused on topic of hope. Patients participated in facilitated discussion around topic, connecting with one another around experiences and definitions for hope. Group members engaged with visual explorer photos, reflecting on what hope looks like for them today. Group engaged in discussion around how their definitions of hope are present today in hospital.  Modalities: Psycho-social ed, Adlerian, Narrative, MI  Patient Progress: DID NOT ATTEND

## 2023-09-05 NOTE — Group Note (Signed)
 Group Topic: Recovery Basics  Group Date: 09/05/2023 Start Time: 1000 End Time: 1040 Facilitators: Vicki Mallet, NT  Department: [facility]  Number of Participants: 8  Group Focus: goals/reality orientation Treatment Modality:  Psychoeducation Interventions utilized were patient education Purpose: reinforce self-care  Name: Jenna [name] Date of Birth: 08-09-1995  MR: [id number]    Level of Participation: DID NOT ATTEND Quality of Participation:  Interactions with others:  Mood/Affect:  Triggers (if applicable):  Cognition:  Progress:  Response:  Plan:   Patients Problems:  Patient Active Problem List   Diagnosis Date Noted   Unspecified mood (affective) disorder (HCC) 09/03/2023   MDD (major depressive disorder), recurrent episode, severe (HCC) 02/02/2022   Cocaine abuse (HCC) 02/02/2022   Alcohol abuse 02/02/2022   Cannabis abuse 02/02/2022   Encounter for sterilization 01/02/2018   Postpartum care following cesarean delivery 01/02/2018   Supervision of high risk pregnancy, antepartum 10/08/2017   Atypical squamous cells cannot exclude high grade squamous intraepithelial lesion on cytologic smear of cervix (ASC-H) 09/17/2017   Anemia in pregnancy 09/17/2017   Encounter for supervision of normal pregnancy, unspecified, unspecified trimester 09/03/2017   History of cesarean section 09/03/2017   Late prenatal care 09/03/2017   Post-partum depression 04/03/2017   MDD (major depressive disorder), recurrent severe, without psychosis (HCC) 04/02/2017   History of pelvic fracture 01/28/2017   Social problem 01/02/2017   Tobacco smoking affecting pregnancy, antepartum 10/27/2016

## 2023-09-05 NOTE — Progress Notes (Signed)
 Pt is awake, alert and oriented X3. Pt is irritable, labile and argumentative. Pt was upset earlier because she wanted to be discharged. Pt was difficult to de-escalate or redirect. Administered scheduled meds per order. Pt denies pain and current SI/HI/AVH, plan or intent. Staff will monitor for pt's safety.

## 2023-09-05 NOTE — Group Note (Signed)
 Group Topic: Positive Affirmations  Group Date: 09/05/2023 Start Time: 0800 End Time: 2100 Facilitators: Quinn Axe, NT  Department: [facility]  Number of Participants: 1  Group Focus: acceptance, affirmation, and clarity of thought Treatment Modality:  Cognitive Behavioral Therapy, Individual Therapy, and Spiritual Interventions utilized were clarification and story telling Purpose: improve communication skills, increase insight, regain self-worth, and reinforce self-care  Name: Jenna [name] Date of Birth: Sep 11, 1995  MR: [id number]    Level of Participation: active Quality of Participation: attentive, cooperative, engaged, motivated, and offered feedback Interactions with others: gave feedback Mood/Affect: positive Triggers (if applicable): none Cognition: coherent/clear, goal directed, and insightful Progress: Gaining insight Response:  Pt was insightful about returning home to be with her kids.   Pt expressed gratitude for her mother being able to take care of her kids.  Pt does understand that she needs to work on how she responds to different situations.  Pt states she is willing to get the help that is needed. Plan: patient will be encouraged to seek Therapeutic relationships.  Patient expressed that she will seek therapy to help her in reconnecting with her family and kids.  Patients Problems:  Patient Active Problem List   Diagnosis Date Noted   Unspecified mood (affective) disorder (HCC) 09/03/2023   MDD (major depressive disorder), recurrent episode, severe (HCC) 02/02/2022   Cocaine abuse (HCC) 02/02/2022   Alcohol abuse 02/02/2022   Cannabis abuse 02/02/2022   Encounter for sterilization 01/02/2018   Postpartum care following cesarean delivery 01/02/2018   Supervision of high risk pregnancy, antepartum 10/08/2017   Atypical squamous cells cannot exclude high grade squamous intraepithelial lesion on cytologic smear of cervix (ASC-H)  09/17/2017   Anemia in pregnancy 09/17/2017   Encounter for supervision of normal pregnancy, unspecified, unspecified trimester 09/03/2017   History of cesarean section 09/03/2017   Late prenatal care 09/03/2017   Post-partum depression 04/03/2017   MDD (major depressive disorder), recurrent severe, without psychosis (HCC) 04/02/2017   History of pelvic fracture 01/28/2017   Social problem 01/02/2017   Tobacco smoking affecting pregnancy, antepartum 10/27/2016

## 2023-09-05 NOTE — ED Notes (Signed)
 Patient in the Dayroom calm and composed  at moment watching TV. NAD, denies SI/HI & AVH. Respirations even and unlabored. Will monitor for safety.

## 2023-09-05 NOTE — ED Notes (Signed)
 Bedtime meds given as pt continues to be agitated and making up stories about RN not given her meds and want to give what is not ordered.

## 2023-09-05 NOTE — ED Provider Notes (Signed)
 Behavioral Health Progress Note  Date and Time: 09/05/2023 11:14 AM Name: Jenna [name] MRN:  [id number]  Jenna [name] is a 28 yo female with a past psychiatric history of MDD, polysubstance abuse (cocaine, alcohol, cannabis), equivocal bipolar affective disorder history, and prior psychiatric hospitalizations (last at [facility] in 2023).  The patient arrived to [facility] ED on 09/02/2023 via EMS for concerns of attempted overdose on Xanax was IVC.  She was medically stabilized and discharged on 09/03/2023.  Patient presented to [facility] on same day for crisis stabilization, with patient reporting worsening mood in the setting of medication noncompliance and ongoing substance use.  UDS obtained on admission is positive for cocaine and THC.   Subjective:   Patient was evaluated on the unit, after being overheard becoming verbally agitated with medical staff.  She is tearful and expressed frustration about her current hospitalization, stating that she believed to be wrongfully hospitalized.  She has continued to deny suicidal ideations, contracting for safety, but has exhibited continued lability and impulsivity.  She wishes to be discharged to the [facility], and she declined for patient's [facility] social worker to be contacted today.  Support and encouragement was provided, the patient was able to de-escalate her behaviors when she was placed in an isolated environment.  She remained tearful, but reported that she would go to her room if she felt frustrated again.  She believes her current psychotropic medications are adequate, denies any side effects to them.  She is amenable to having them adjusted today.  She reports no cravings or withdrawals from substances on interview.  This provider and LCSW spoke with patient and the patient declined offers for outpatient psychiatric follow-up.  She declined offers to engage in breathing exercises and expressed little interest to obtain coping mechanisms  Diagnosis:  Final diagnoses:   Stimulant use disorder  Cocaine abuse (HCC)  Substance induced mood disorder (HCC)  Psychoactive substance-induced psychosis (HCC)  Tobacco use disorder  Cannabis use disorder    Total Time spent with patient: 1.5 hours  Substance Use Hx: Tobacco: vaping and cigarettes for the past 4 years. Smokes about 6 cigarretes  Cannabis:smoking since age 51, blunts/edibles/weed pens. Last use prior to admission Cocaine: using since [age] yo, uses daily, intransal use. 1 gram daily.  Opioids: reports using perocets intermittently   Past Psychiatric Hx: Current psych meds: Denies Dx: pt reports she has bene previously diagnosed with severe anxiety  Prior psych meds: Remeron, hydroxyzine, gabapentin  Outpatient: Denies  Inpatient: Multiple  Non-suicidal self injury: Patient denies  Suicide attempts: Patient denies. Multiple suicide attempts by medication overdose Violence: Denies  Drugs/alcohol: Per chart review cocaine, alcohol, cannabis. Patient denies.  Trauma/neglect/abuse: Yes   Past Medical History: No current PCP follow-up.  No chronic medical conditions or current medications.  Allergies include a cyclobenzaprine and Haldol   Social History: Homeless, staying at [facility]. Patient reports she has a son  Current Medications:  Current Facility-Administered Medications  Medication Dose Route Frequency Provider Last Rate Last Admin   acetaminophen (TYLENOL) tablet 650 mg  650 mg Oral Q6H PRN Lenox Ponds, NP   650 mg at 09/04/23 2123   alum & mag hydroxide-simeth (MAALOX/MYLANTA) 200-200-20 MG/5ML suspension 30 mL  30 mL Oral Q4H PRN Lenox Ponds, NP       dicyclomine (BENTYL) tablet 20 mg  20 mg Oral Q6H PRN Carrion-Carrero, Jeury Mcnab, MD       gabapentin (NEURONTIN) capsule 300 mg  300 mg Oral TID Zouev, Dmitri,  MD   300 mg at 09/05/23 0853   hydrOXYzine (ATARAX) tablet 50 mg  50 mg Oral TID PRN Miguel Rota, MD   50 mg at 09/04/23 1219   loperamide (IMODIUM) capsule 2-4 mg  2-4  mg Oral PRN Carrion-Carrero, Karle Starch, MD       magnesium hydroxide (MILK OF MAGNESIA) suspension 30 mL  30 mL Oral Daily PRN Lenox Ponds, NP       methocarbamol (ROBAXIN) tablet 500 mg  500 mg Oral Q8H PRN Carrion-Carrero, Gabriella Guile, MD       mirtazapine (REMERON SOL-TAB) disintegrating tablet 15 mg  15 mg Oral QHS Zouev, Dmitri, MD   15 mg at 09/04/23 2123   naproxen (NAPROSYN) tablet 500 mg  500 mg Oral BID PRN Carrion-Carrero, Karle Starch, MD       nicotine (NICODERM CQ - dosed in mg/24 hours) patch 21 mg  21 mg Transdermal Q0600 Lenox Ponds, NP   21 mg at 09/04/23 0528   OLANZapine (ZYPREXA) injection 10 mg  10 mg Intramuscular TID PRN Lenox Ponds, NP       OLANZapine (ZYPREXA) injection 5 mg  5 mg Intramuscular TID PRN Lenox Ponds, NP       OLANZapine zydis (ZYPREXA) disintegrating tablet 5 mg  5 mg Oral TID PRN Lenox Ponds, NP       ondansetron (ZOFRAN-ODT) disintegrating tablet 4 mg  4 mg Oral Q6H PRN Carrion-Carrero, Avrey Hyser, MD       QUEtiapine (SEROQUEL) tablet 50 mg  50 mg Oral Q6H PRN Carrion-Carrero, Karle Starch, MD   50 mg at 09/05/23 0911   QUEtiapine (SEROQUEL) tablet 50 mg  50 mg Oral BID Carrion-Carrero, Karle Starch, MD       Current Outpatient Medications  Medication Sig Dispense Refill   gabapentin (NEURONTIN) 100 MG capsule Take 1 capsule (100 mg total) by mouth 2 (two) times daily. (Patient not taking: Reported on 09/02/2023) 60 capsule 1   ibuprofen (ADVIL) 800 MG tablet Take 1 tablet (800 mg total) by mouth every 8 (eight) hours as needed. (Patient not taking: Reported on 09/02/2023) 30 tablet 0   metroNIDAZOLE (FLAGYL) 500 MG tablet Take 1 tablet (500 mg total) by mouth 2 (two) times daily. (Patient not taking: Reported on 09/02/2023) 14 tablet 0    Labs  Lab Results:  Admission on 09/03/2023  Component Date Value Ref Range Status   Opiates 09/04/2023 NONE DETECTED  NONE DETECTED Final   Cocaine 09/04/2023 POSITIVE (A)  NONE DETECTED Final   Benzodiazepines  09/04/2023 NONE DETECTED  NONE DETECTED Final   Amphetamines 09/04/2023 NONE DETECTED  NONE DETECTED Final   Tetrahydrocannabinol 09/04/2023 POSITIVE (A)  NONE DETECTED Final   Barbiturates 09/04/2023 NONE DETECTED  NONE DETECTED Final   Comment: (NOTE) DRUG SCREEN FOR MEDICAL PURPOSES ONLY.  IF CONFIRMATION IS NEEDED FOR ANY PURPOSE, NOTIFY LAB WITHIN 5 DAYS.  LOWEST DETECTABLE LIMITS FOR URINE DRUG SCREEN Drug Class                     Cutoff (ng/mL) Amphetamine and metabolites    1000 Barbiturate and metabolites    200 Benzodiazepine                 200 Opiates and metabolites        300 Cocaine and metabolites        300 THC  50 Performed at [facility], [address], [place], [place] [address]    Specimen Source 09/04/2023 URINE, CLEAN CATCH   Final   Color, Urine 09/04/2023 YELLOW  YELLOW Final   APPearance 09/04/2023 HAZY (A)  CLEAR Final   Specific Gravity, Urine 09/04/2023 1.018  1.005 - 1.030 Final   pH 09/04/2023 7.0  5.0 - 8.0 Final   Glucose, UA 09/04/2023 NEGATIVE  NEGATIVE mg/dL Final   Hgb urine dipstick 09/04/2023 LARGE (A)  NEGATIVE Final   Bilirubin Urine 09/04/2023 NEGATIVE  NEGATIVE Final   Ketones, ur 09/04/2023 NEGATIVE  NEGATIVE mg/dL Final   Protein, ur 60/45/4098 NEGATIVE  NEGATIVE mg/dL Final   Nitrite 11/91/4782 NEGATIVE  NEGATIVE Final   Leukocytes,Ua 09/04/2023 NEGATIVE  NEGATIVE Final   RBC / HPF 09/04/2023 >50  0 - 5 RBC/hpf Final   WBC, UA 09/04/2023 6-10  0 - 5 WBC/hpf Final   Comment:        Reflex urine culture not performed if WBC <=10, OR if Squamous epithelial cells >5. If Squamous epithelial cells >5 suggest recollection.    Bacteria, UA 09/04/2023 RARE (A)  NONE SEEN Final   Squamous Epithelial / HPF 09/04/2023 0-5  0 - 5 /HPF Final   Mucus 09/04/2023 PRESENT   Final   Performed at [facility], [address] [place], [place] [address]   Ferritin 09/04/2023 4 (L)  11 - 307 ng/mL Final    Performed at [facility], [address] [place], [place] [address]   Iron 09/04/2023 48  28 - 170 ug/dL Final   TIBC 78/46/9629 473 (H)  250 - 450 ug/dL Final   Saturation Ratios 09/04/2023 10 (L)  10.4 - 31.8 % Final   UIBC 09/04/2023 425  ug/dL Final   Performed at [facility], [address], [place], [place] [address]  Admission on 09/02/2023, Discharged on 09/03/2023  Component Date Value Ref Range Status   Sodium 09/02/2023 142  135 - 145 mmol/L Final   Potassium 09/02/2023 3.4 (L)  3.5 - 5.1 mmol/L Final   Chloride 09/02/2023 106  98 - 111 mmol/L Final   CO2 09/02/2023 23  22 - 32 mmol/L Final   Glucose, Bld 09/02/2023 123 (H)  70 - 99 mg/dL Final   Glucose reference range applies only to samples taken after fasting for at least 8 hours.   BUN 09/02/2023 7  6 - 20 mg/dL Final   Creatinine, Ser 09/02/2023 0.96  0.44 - 1.00 mg/dL Final   Calcium 32/44/0102 8.5 (L)  8.9 - 10.3 mg/dL Final   Total Protein 72/53/6644 6.2 (L)  6.5 - 8.1 g/dL Final   Albumin 03/47/4259 3.3 (L)  3.5 - 5.0 g/dL Final   AST 56/38/7564 27  15 - 41 U/L Final   ALT 09/02/2023 19  0 - 44 U/L Final   Alkaline Phosphatase 09/02/2023 41  38 - 126 U/L Final   Total Bilirubin 09/02/2023 0.4  0.0 - 1.2 mg/dL Final   GFR, Estimated 09/02/2023 >60  >60 mL/min Final   Comment: (NOTE) Calculated using the CKD-EPI Creatinine Equation (2021)    Anion gap 09/02/2023 13  5 - 15 Final   Performed at [facility], [address] [place], [place] [address]   Alcohol, Ethyl (B) 09/02/2023 <10  <10 mg/dL Final   Comment: (NOTE) Lowest detectable limit for serum alcohol is 10 mg/dL.  For medical purposes only. Performed at [facility], [address], [place], [place] [address]    WBC 09/02/2023 7.2  4.0 - 10.5 K/uL Final   RBC 09/02/2023 3.64 (L)  3.87 - 5.11 MIL/uL Final   Hemoglobin 09/02/2023 9.8 (L)  12.0 - 15.0 g/dL Final   HCT 56/38/7564 31.1 (L)  36.0 - 46.0 % Final   MCV  09/02/2023 85.4  80.0 - 100.0 fL Final   MCH 09/02/2023 26.9  26.0 - 34.0 pg Final   MCHC 09/02/2023 31.5  30.0 - 36.0 g/dL Final   RDW 33/29/5188 17.2 (H)  11.5 - 15.5 % Final   Platelets 09/02/2023 281  150 - 400 K/uL Final   nRBC 09/02/2023 0.0  0.0 - 0.2 % Final   Neutrophils Relative % 09/02/2023 62  % Final   Neutro Abs 09/02/2023 4.5  1.7 - 7.7 K/uL Final   Lymphocytes Relative 09/02/2023 27  % Final   Lymphs Abs 09/02/2023 1.9  0.7 - 4.0 K/uL Final   Monocytes Relative 09/02/2023 7  % Final   Monocytes Absolute 09/02/2023 0.5  0.1 - 1.0 K/uL Final   Eosinophils Relative 09/02/2023 3  % Final   Eosinophils Absolute 09/02/2023 0.2  0.0 - 0.5 K/uL Final   Basophils Relative 09/02/2023 1  % Final   Basophils Absolute 09/02/2023 0.1  0.0 - 0.1 K/uL Final   Immature Granulocytes 09/02/2023 0  % Final   Abs Immature Granulocytes 09/02/2023 0.03  0.00 - 0.07 K/uL Final   Performed at [facility], [address], [place], [place] [address]   Preg, Serum 09/02/2023 NEGATIVE  NEGATIVE Final   Comment:        THE SENSITIVITY OF THIS METHODOLOGY IS >10 mIU/mL. Performed at [facility], [address] [place], [place] [address]    Salicylate Lvl 09/02/2023 <7.0 (L)  7.0 - 30.0 mg/dL Final   Performed at [facility], [address] [place], [place] [address]   Acetaminophen (Tylenol), Serum 09/02/2023 <10 (L)  10 - 30 ug/mL Final   Comment: (NOTE) Therapeutic concentrations vary significantly. A range of 10-30 ug/mL  may be an effective concentration for many patients. However, some  are best treated at concentrations outside of this range. Acetaminophen concentrations >150 ug/mL at 4 hours after ingestion  and >50 ug/mL at 12 hours after ingestion are often associated with  toxic reactions.  Performed at [facility], [address] [place], [place] [address]    SARS Coronavirus 2 by RT PCR 09/02/2023 NEGATIVE  NEGATIVE Final   Influenza A by PCR  09/02/2023 NEGATIVE  NEGATIVE Final   Influenza B by PCR 09/02/2023 NEGATIVE  NEGATIVE Final   Comment: (NOTE) The Xpert Xpress SARS-CoV-2/FLU/RSV plus assay is intended as an aid in the diagnosis of influenza from Nasopharyngeal swab specimens and should not be used as a sole basis for treatment. Nasal washings and aspirates are unacceptable for Xpert Xpress SARS-CoV-2/FLU/RSV testing.  Fact Sheet for Patients: BloggerCourse.com  Fact Sheet for Healthcare Providers: SeriousBroker.it  This test is not yet approved or cleared by the [place] FDA and has been authorized for detection and/or diagnosis of SARS-CoV-2 by FDA under an Emergency Use Authorization (EUA). This EUA will remain in effect (meaning this test can be used) for the duration of the COVID-19 declaration under Section 564(b)(1) of the Act, 21 U.S.C. section 360bbb-3(b)(1), unless the authorization is terminated or revoked.     Resp Syncytial Virus by PCR 09/02/2023 NEGATIVE  NEGATIVE Final   Comment: (NOTE) Fact Sheet for Patients: BloggerCourse.com  Fact Sheet for Healthcare Providers: SeriousBroker.it  This test is not yet approved or cleared by the [place] FDA and has been authorized for detection and/or diagnosis of SARS-CoV-2 by FDA under an Emergency Use Authorization (EUA). This EUA will remain in effect (meaning this test can be used) for the duration of the COVID-19 declaration under Section 564(b)(1) of the Act, 21 U.S.C. section 360bbb-3(b)(1), unless the authorization is terminated or revoked.  Performed at [facility], [address], [place], [place] [address]    Acetaminophen (Tylenol), Serum 09/02/2023 <10 (L)  10 - 30 ug/mL Final   Comment: (NOTE) Therapeutic concentrations vary significantly. A range of 10-30 ug/mL  may be an effective concentration for many patients.  However, some  are best treated at concentrations outside of this range. Acetaminophen concentrations >150 ug/mL at 4 hours after ingestion  and >50 ug/mL at 12 hours after ingestion are often associated with  toxic reactions.  Performed at [facility], [address] [place], [place] [address]   Orders Only on 07/28/2023  Component Date Value Ref Range Status   Atopobium vaginae 07/28/2023 High - 2 (A)  Score Final   BVAB 2 07/28/2023 High - 2 (A)  Score Final   Megasphaera 1 07/28/2023 High - 2 (A)  Score Final   Comment: Calculate total score by adding the 3 individual bacterial vaginosis (BV) marker scores together.  Total score is interpreted as follows: Total score 0-1: Indicates the absence of BV. Total score   2: Indeterminate for BV. Additional clinical                  data should be evaluated to establish a                  diagnosis. Total score 3-6: Indicates the presence of BV.    Candida albicans, NAA 07/28/2023 Negative  Negative Final   Candida glabrata, NAA 07/28/2023 Negative  Negative Final   Chlamydia trachomatis, NAA 07/28/2023 Negative  Negative Final   Neisseria Gonorrhoeae by PCR 07/28/2023 Negative  Negative Final   specimen status report 07/28/2023 Comment   Final   Comment: Written Authorization Written Authorization Written Authorization Received. Authorization received from ORIGINAL REQ 07-30-2023 Logged by Adrienne Mocha   Office Visit on 07/28/2023  Component Date Value Ref Range Status   HIV Screen 4th Generation wRfx 07/28/2023 Non Reactive  Non Reactive Final   Comment: HIV-1/HIV-2 antibodies and HIV-1 p24 antigen were NOT detected. There is no laboratory evidence of HIV infection. HIV Negative    RPR Ser Ql 07/28/2023 Non Reactive  Non Reactive Final   Glucose 07/28/2023 75  70 - 99 mg/dL Final   BUN 82/95/6213 11  6 - 20 mg/dL Final   Creatinine, Ser 07/28/2023 0.76  0.57 - 1.00 mg/dL Final   eGFR 08/65/7846 110  >59 mL/min/1.73  Final   BUN/Creatinine Ratio 07/28/2023 14  9 - 23 Final   Sodium 07/28/2023 141  134 - 144 mmol/L Final   Potassium 07/28/2023 5.0  3.5 - 5.2 mmol/L Final   Chloride 07/28/2023 101  96 - 106 mmol/L Final   Calcium 07/28/2023 9.5  8.7 - 10.2 mg/dL Final   Total Protein 96/29/5284 7.3  6.0 - 8.5 g/dL Final   Albumin 13/24/4010 4.2  4.0 - 5.0 g/dL Final   Globulin, Total 07/28/2023 3.1  1.5 - 4.5 g/dL Final  Bilirubin Total 07/28/2023 <0.2  0.0 - 1.2 mg/dL Final   Alkaline Phosphatase 07/28/2023 64  44 - 121 IU/L Final   AST 07/28/2023 20  0 - 40 IU/L Final  Admission on 04/22/2023, Discharged on 04/22/2023  Component Date Value Ref Range Status   Glucose-Capillary 04/22/2023 80  70 - 99 mg/dL Final   Glucose reference range applies only to samples taken after fasting for at least 8 hours.   Sodium 04/22/2023 136  135 - 145 mmol/L Final   Potassium 04/22/2023 4.0  3.5 - 5.1 mmol/L Final   Chloride 04/22/2023 107  98 - 111 mmol/L Final   CO2 04/22/2023 23  22 - 32 mmol/L Final   Glucose, Bld 04/22/2023 83  70 - 99 mg/dL Final   Glucose reference range applies only to samples taken after fasting for at least 8 hours.   BUN 04/22/2023 14  6 - 20 mg/dL Final   Creatinine, Ser 04/22/2023 0.69  0.44 - 1.00 mg/dL Final   Calcium 16/04/9603 8.3 (L)  8.9 - 10.3 mg/dL Final   GFR, Estimated 04/22/2023 >60  >60 mL/min Final   Comment: (NOTE) Calculated using the CKD-EPI Creatinine Equation (2021)    Anion gap 04/22/2023 6  5 - 15 Final   Performed at [facility], [address] [place], [place] [address]   WBC 04/22/2023 9.6  4.0 - 10.5 K/uL Final   RBC 04/22/2023 4.39  3.87 - 5.11 MIL/uL Final   Hemoglobin 04/22/2023 12.1  12.0 - 15.0 g/dL Final   HCT 11/91/4782 38.0  36.0 - 46.0 % Final   MCV 04/22/2023 86.6  80.0 - 100.0 fL Final   MCH 04/22/2023 27.6  26.0 - 34.0 pg Final   MCHC 04/22/2023 31.8  30.0 - 36.0 g/dL Final   RDW 95/62/1308 17.3 (H)  11.5 - 15.5 % Final    Platelets 04/22/2023 304  150 - 400 K/uL Final   nRBC 04/22/2023 0.0  0.0 - 0.2 % Final   Neutrophils Relative % 04/22/2023 71  % Final   Neutro Abs 04/22/2023 6.8  1.7 - 7.7 K/uL Final   Lymphocytes Relative 04/22/2023 19  % Final   Lymphs Abs 04/22/2023 1.8  0.7 - 4.0 K/uL Final   Monocytes Relative 04/22/2023 8  % Final   Monocytes Absolute 04/22/2023 0.7  0.1 - 1.0 K/uL Final   Eosinophils Relative 04/22/2023 2  % Final   Eosinophils Absolute 04/22/2023 0.2  0.0 - 0.5 K/uL Final   Basophils Relative 04/22/2023 0  % Final   Basophils Absolute 04/22/2023 0.0  0.0 - 0.1 K/uL Final   Immature Granulocytes 04/22/2023 0  % Final   Abs Immature Granulocytes 04/22/2023 0.04  0.00 - 0.07 K/uL Final   Performed at [facility], [address], [place], [place] [address]   Preg, Serum 04/22/2023 NEGATIVE  NEGATIVE Final   Comment:        THE SENSITIVITY OF THIS METHODOLOGY IS >10 mIU/mL. Performed at [facility], [address], [place], [place] [address]     Blood Alcohol level:  Lab Results  Component Value Date   ETH <10 09/02/2023   ETH <10 01/31/2022    Metabolic Disorder Labs: No results found for: "HGBA1C", "MPG" No results found for: "PROLACTIN" No results found for: "CHOL", "TRIG", "HDL", "CHOLHDL", "VLDL", "LDLCALC"  Therapeutic Lab Levels: No results found for: "LITHIUM" No results found for: "VALPROATE" No results found for: "CBMZ"  Physical Findings  AIMS    Flowsheet Row [facility]  AIMS Total Score 0      AUDIT    Flowsheet Row [facility]  Alcohol Use Disorder Identification Test Final Score (AUDIT) 2      PHQ2-9    Flowsheet Row ED from 09/03/2023 in [facility]  PHQ-2 Total Score 3  PHQ-9 Total Score 7      Flowsheet Row ED  from 09/03/2023 in [facility] ED from 09/02/2023 in [facility] Department at [facility] ED from 04/22/2023 in [facility] Department at [facility]  C-SSRS RISK CATEGORY No Risk No Risk No Risk        Musculoskeletal  Strength & Muscle Tone: within normal limits Gait & Station: normal Patient leans: N/A  Psychiatric Specialty Exam  Presentation  General Appearance:  Appropriate for Environment  Eye Contact: Good  Speech: Clear and Coherent; Normal Rate  Speech Volume: Increased  Handedness:-- (not assessed)   Mood and Affect  Mood: Labile  Affect: Congruent (irritable)   Thought Process  Thought Processes: Linear  Descriptions of Associations:Intact  Orientation:None  Thought Content:Logical; Rumination     Hallucinations:Hallucinations: None  Ideas of Reference:None  Suicidal Thoughts:Suicidal Thoughts: No  Homicidal Thoughts:Homicidal Thoughts: No   Sensorium  Memory: Immediate Good; Recent Good; Remote Good  Judgment: Poor  Insight: -- (limited)   Executive Functions  Concentration: Fair  Attention Span: Fair  Recall: [employer] of Knowledge: Fair  Language: Fair   Psychomotor Activity  Psychomotor Activity: Psychomotor Activity: Normal   Assets  Assets: Desire for Improvement; Resilience; Communication Skills   Sleep  Sleep: Sleep: Fair   Nutritional Assessment (For OBS and FBC admissions only) Has the patient had a weight loss or gain of 10 pounds or more in the last 3 months?: No Has the patient had a decrease in food intake/or appetite?: No Does the patient have dental problems?: No Does the patient have eating habits or behaviors that may be indicators of an eating disorder including binging or inducing vomiting?: No Has the patient recently lost weight without trying?: 0 Has the patient been eating poorly because of a decreased appetite?:  0 Malnutrition Screening Tool Score: 0    Physical Exam  Physical Exam Vitals and nursing note reviewed.  Constitutional:      General: She is not in acute distress.    Appearance: She is not ill-appearing.  HENT:     Head: Normocephalic and atraumatic.  Eyes:     Extraocular Movements: Extraocular movements intact.     Conjunctiva/sclera: Conjunctivae normal.  Pulmonary:     Effort: Pulmonary effort is normal. No respiratory distress.  Skin:    General: Skin is warm and dry.  Neurological:     General: No focal deficit present.    Review of Systems  All other systems reviewed and are negative.  Blood pressure 127/79, pulse 83, temperature 97.8 F (36.6 C), temperature source Oral, resp. rate 18, SpO2 99%. There is no height or weight on file to calculate BMI.  Treatment Plan Summary: Daily contact with patient to assess and evaluate symptoms and progress in treatment and Medication management Status: under IVC; second exam completed     Psychiatric Diagnoses and Treatment:  Substance-induced psychosis Stimulant use disorder, cocaine type Continue Remeron 50 mg nightly Continue gabapentin 300 mg 3  times daily, medication primarily given for neuropathy with added benefit of addressing anxiety, and withdrawal induced Increased frequency of Seroquel to Seroquel 50 mg every 12 hours   Tobacco use disorder Smoking cessation encouraged Nicotine patch 21 mg daily   Cannabis use disorder Cessation encouraged Supportive PRNs     Medical Issues Being Addressed:    Workup of anemia TIBC/ferritin pending   Other PRNs: acetaminophen, 650 mg, Q6H PRN alum & mag hydroxide-simeth, 30 mL, Q4H PRN dicyclomine, 20 mg, Q6H PRN hydrOXYzine, 50 mg, TID PRN loperamide, 2-4 mg, PRN magnesium hydroxide, 30 mL, Daily PRN methocarbamol, 500 mg, Q8H PRN naproxen, 500 mg, BID PRN OLANZapine, 10 mg, TID PRN OLANZapine, 5 mg, TID PRN OLANZapine zydis, 5 mg, TID PRN ondansetron, 4  mg, Q6H PRN       Other Labs/Imaging Reviewed: UDS positive for cocaine and THC UA showing bacteria COVID-negative Acetaminophen and salicylate levels are negative for toxicity Serum hCG is negative CBC showing normocytic anemia with hemoglobin 9.8   EKG on 09/03/2023: QTc 409     Disposition: TBD  Signed: Lorri Frederick, MD 09/05/2023 11:14 AM

## 2023-09-05 NOTE — Discharge Planning (Signed)
 LCSW and MD spoke with patient on this morning as she was agitated and irritable about being here at the [facility]. Patient continued to state that she feels like she is not supposed to be here and was requesting to be discharged. MD and LCSW informed the patient about the IVC process and how patient would need to display appropriate behavior and self control. Patient was able to process through her feelings regarding her admission and current goal. Patient reports her goal is to be discharged and then follow up with the [facility] in order to continue her application for housing. Patient provided permission on yesterday for Case Worker to be called, however reported she did not understand why the case worker needed to be called at all. Patient reports she feels like the Case Worker would likely try to say something negative and she does not want that to be an issue for her moving forward. LCSW and MD explained the importance of safety planning in order to ensure a safe discharge plan. Patient expressed understanding. Patient was able to be de-escalated and agreed to take some time to herself to gather herself. Patient aware that staff is here to support her along her journey and patient aware that staff is available as need. Patient declined offers for outpatient follow up stating she has been doing things on her own for a while. Patient is amenable to having her medications adjusted today. Patient reports no cravings or withdrawals from substances on interview. Patient also denies any SI/HI/AVH.   LCSW attempted to get in contact with Case Worker Amber at [phone], however voice message stated another name Dayton Martes. Non-emergency voice message was left and no patient identifying information was provided. LCSW will follow up with updates as received.   Fernande Boyden, LCSW Clinical Social Worker [facility] Ph: [phone]

## 2023-09-05 NOTE — Progress Notes (Addendum)
 Pt self isolated in her room but woke up for meals. Pt continues to be irritable and argumentative. Pt complained of headache and anxiety. PRN Acetaminophen and Hydroxyzine administered per order. Staff will monitor for pt's safety.

## 2023-09-05 NOTE — Discharge Instructions (Signed)
 [facility] [address], [place], [address] [phone] phone  New Patient Assessment/Therapy Walk-Ins:  Monday and Wednesday: 8 am until slots are full. Every 1st and 2nd Fridays of the month: 1 pm - 5 pm.  NO ASSESSMENT/THERAPY WALK-INS ON TUESDAYS OR THURSDAYS  New Patient Assessment/Medication Management Walk-Ins:  Monday - Friday:  8 am - 11 am.  For all walk-ins, we ask that you arrive by 7:30 am because patients will be seen in the order of arrival.  Availability is limited; therefore, you may not be seen on the same day that you walk-in.  Our goal is to serve and meet the needs of our community to the best of our [place] ability.  SUBSTANCE USE TREATMENT for Medicaid and State Funded/IPRS  [facility] [address], [place], [address] [phone] phone NOTE: [facility] is no longer offering IOP services.  Serves those who are low-income or have no insurance.  [facility] [address] [place], [place], [address] [phone] phone [phone] fax NOTE: Does have Substance Abuse-Intensive Outpatient Program [facility]) as well as transitional housing if eligible.  [facility] [address] [place], [place], [address] [phone] phone [phone] fax  [facility] [phone] [place], [place], [address] [phone] phone [phone] fax  HALFWAY HOUSES:  [facility] [phone]  [employer].oxfordvacancies.com  12 STEP PROGRAMS:  Alcoholics Anonymous of [place] [employer]alet[employer]be  Narcotics Anonymous of [place] [employer]dk  Al-Anon of [place], [place] www.[place]alanon.org/find-meetings.html  Nar-Anon https://nar-anon.org/find-a-meetin  List of Residential placements:   [facility] in [place]: [phone]  [facility]: [phone]  [place], [place]  [phone]: Female and female facility; 30-day program: (uninsured and Medicaid such as Laurena Bering, [place], [place], partners)  [facility]: [phone]; men and women's facility; 28 days; Can have Medicaid tailored plan Tour manager or Partners)  [facility]: [phone] Karoline Caldwell or Larita Fife; 28 day program; must be fully detox; tailored Medicaid or no insurance  [address] in [place], [place]; [phone]; 28 day all males program; no insurance accepted  [facility] in [place]: Gabriel Rung [phone] (no insurance or Medicaid only); 90 days; outpatient services but provide housing in apartments [place]  [facility] Admission: [phone]: Patient must complete phone screening for placement: [place], [place]; 6 month program; uninsured, Medicaid, and [employer].   [facility]: no insurance required; [phone]  [facility]: [phone]; Intake: Molly Maduro; Must fill out application online; Alecia Lemming Delay [phone] x [address] [facility] in [place], [place]: [phone]; Admissions Coordinators Mr. Maurine Minister or Barron Alvine; 90 day program.  [facility]: [place], [place] [phone]; Co-Ed 9 month to a year program; Online application; Men entry fee is $500 (6-53months);  [employer]: [address] [place], [place] [address]; no fee or insurance required; minimum of 2 years; Highly structured; work based; Intake Coordinator is Thayer Ohm [phone]  [facility] in [place], [place]: [phone]; Fax number is [phone]; website: www.[facility].org; Requires 3-6 page autobiography; 2 year program (18 months and then 25month transitional housing); Admission fee is $300; no insurance needed; work Automotive engineer in [place], [place]: [employer] Desk Staff: Danise Edge [phone]: They have a Men's Regenerations Program 6-39months. Free program; There is an initial $300 fee however, they are willing to work  with patients regarding that. Application is online.  [facility]: Admissions [phone] Doran Heater ext [phone]; Any 7-90 day program is out of pocket; 12  month program is free of charge; there is a $275 entry fee; Patient is responsible for own transportation

## 2023-09-05 NOTE — Group Note (Signed)
 Group Topic: Core Beliefs  Group Date: 09/05/2023 Start Time: 0145 End Time: 0245 Facilitators: Loleta Dicker, LCSW  Department: [facility]  Number of Participants: 5  Group Focus: check in and clarity of thought Treatment Modality:  Cognitive Behavioral Therapy Interventions utilized were exploration, story telling, and support Purpose: improve communication skills  Name: Jenna [name] Date of Birth: 1995/12/11  MR: [id number]    Level of Participation: Patient did not participate in group on today. However, patient was encouraged to participate in programming on the unit.   Patients Problems:  Patient Active Problem List   Diagnosis Date Noted   Unspecified mood (affective) disorder (HCC) 09/03/2023   MDD (major depressive disorder), recurrent episode, severe (HCC) 02/02/2022   Cocaine abuse (HCC) 02/02/2022   Alcohol abuse 02/02/2022   Cannabis abuse 02/02/2022   Encounter for sterilization 01/02/2018   Postpartum care following cesarean delivery 01/02/2018   Supervision of high risk pregnancy, antepartum 10/08/2017   Atypical squamous cells cannot exclude high grade squamous intraepithelial lesion on cytologic smear of cervix (ASC-H) 09/17/2017   Anemia in pregnancy 09/17/2017   Encounter for supervision of normal pregnancy, unspecified, unspecified trimester 09/03/2017   History of cesarean section 09/03/2017   Late prenatal care 09/03/2017   Post-partum depression 04/03/2017   MDD (major depressive disorder), recurrent severe, without psychosis (HCC) 04/02/2017   History of pelvic fracture 01/28/2017   Social problem 01/02/2017   Tobacco smoking affecting pregnancy, antepartum 10/27/2016

## 2023-09-06 ENCOUNTER — Other Ambulatory Visit: Payer: Self-pay

## 2023-09-06 MED ORDER — NICOTINE 21 MG/24HR TD PT24: 21.0000 mg | MEDICATED_PATCH | Freq: Every day | TRANSDERMAL | 0 refills | Status: DC

## 2023-09-06 MED ORDER — HYDROXYZINE HCL 50 MG PO TABS
50.0000 mg | ORAL_TABLET | Freq: Three times a day (TID) | ORAL | 0 refills | Status: DC | PRN
  Filled 2023-09-06: qty 30, 10d supply, fill #0

## 2023-09-06 MED ORDER — GABAPENTIN 300 MG PO CAPS
300.0000 mg | ORAL_CAPSULE | Freq: Three times a day (TID) | ORAL | 0 refills | Status: DC
  Filled 2023-09-06: qty 90, 30d supply, fill #0

## 2023-09-06 MED ORDER — HYDROXYZINE HCL 50 MG PO TABS: 50.0000 mg | ORAL_TABLET | Freq: Three times a day (TID) | ORAL | 0 refills | Status: DC | PRN

## 2023-09-06 MED ORDER — QUETIAPINE FUMARATE 50 MG PO TABS: 50.0000 mg | ORAL_TABLET | Freq: Two times a day (BID) | ORAL | 0 refills | Status: DC

## 2023-09-06 MED ORDER — QUETIAPINE FUMARATE 50 MG PO TABS
50.0000 mg | ORAL_TABLET | Freq: Two times a day (BID) | ORAL | 0 refills | Status: DC
  Filled 2023-09-06: qty 60, 30d supply, fill #0

## 2023-09-06 MED ORDER — MIRTAZAPINE 15 MG PO TABS
15.0000 mg | ORAL_TABLET | Freq: Every day | ORAL | 0 refills | Status: DC
  Filled 2023-09-06: qty 30, 30d supply, fill #0

## 2023-09-06 MED ORDER — MIRTAZAPINE 15 MG PO TABS: 15.0000 mg | ORAL_TABLET | Freq: Every day | ORAL | 0 refills | Status: DC

## 2023-09-06 MED ORDER — GABAPENTIN 300 MG PO CAPS: 300.0000 mg | ORAL_CAPSULE | Freq: Three times a day (TID) | ORAL | 0 refills | Status: DC

## 2023-09-06 MED ORDER — NICOTINE 21 MG/24HR TD PT24
21.0000 mg | MEDICATED_PATCH | Freq: Every day | TRANSDERMAL | 0 refills | Status: DC
  Filled 2023-09-06: qty 28, 28d supply, fill #0

## 2023-09-06 NOTE — Group Note (Signed)
 Group Topic: Relaxation  Group Date: 09/06/2023 Start Time: 1000 End Time: 1100 Facilitators: Londell Moh, NT  Department: [facility]  Number of Participants: 10  Group Focus: art therapy and check in Treatment Modality:  Art Therapy and Psychoeducation Interventions utilized were group exercise Purpose: enhance coping skills  Name: Jenna [name] Date of Birth: 1995-10-20  MR: [id number]    Level of Participation: active Quality of Participation: attentive Interactions with others: gave feedback Mood/Affect: appropriate Triggers (if applicable): n/a Cognition: coherent/clear Progress: Gaining insight Response: n/a Plan: patient will be encouraged to attend future groups  Patients Problems:  Patient Active Problem List   Diagnosis Date Noted   Unspecified mood (affective) disorder (HCC) 09/03/2023   MDD (major depressive disorder), recurrent episode, severe (HCC) 02/02/2022   Cocaine abuse (HCC) 02/02/2022   Alcohol abuse 02/02/2022   Cannabis abuse 02/02/2022   Encounter for sterilization 01/02/2018   Postpartum care following cesarean delivery 01/02/2018   Supervision of high risk pregnancy, antepartum 10/08/2017   Atypical squamous cells cannot exclude high grade squamous intraepithelial lesion on cytologic smear of cervix (ASC-H) 09/17/2017   Anemia in pregnancy 09/17/2017   Encounter for supervision of normal pregnancy, unspecified, unspecified trimester 09/03/2017   History of cesarean section 09/03/2017   Late prenatal care 09/03/2017   Post-partum depression 04/03/2017   MDD (major depressive disorder), recurrent severe, without psychosis (HCC) 04/02/2017   History of pelvic fracture 01/28/2017   Social problem 01/02/2017   Tobacco smoking affecting pregnancy, antepartum 10/27/2016

## 2023-09-06 NOTE — Group Note (Signed)
 Group Topic: Relapse and Recovery  Group Date: 09/06/2023 Start Time: 1150 End Time: 1158 Facilitators: Emmarae Cowdery, Jacklynn Barnacle, RN  Department: [facility]  Number of Participants: 1  Group Focus: discharge education Treatment Modality:  Individual Therapy Interventions utilized were patient education Purpose: Provide discharge education, answer questions  Name: Jenna [name] Date of Birth: [birth year]  MR: [id number]    Level of Participation: active Quality of Participation: attentive, cooperative, and engaged Interactions with others: gave feedback Mood/Affect: appropriate Triggers (if applicable): None identified Cognition: coherent/clear and logical Progress: Significant Response: Patient voiced understanding of discharge instructions. No questions at this time. Safety plan reviewed. Plan: patient will be encouraged to reach out for support as needed. Attend appointment scheduled with outpatient provider and take all medications as prescribed.  Patients Problems:  Patient Active Problem List   Diagnosis Date Noted   Unspecified mood (affective) disorder (HCC) 09/03/2023   MDD (major depressive disorder), recurrent episode, severe (HCC) 02/02/2022   Cocaine abuse (HCC) 02/02/2022   Alcohol abuse 02/02/2022   Cannabis abuse 02/02/2022   Encounter for sterilization 01/02/2018   Postpartum care following cesarean delivery 01/02/2018   Supervision of high risk pregnancy, antepartum 10/08/2017   Atypical squamous cells cannot exclude high grade squamous intraepithelial lesion on cytologic smear of cervix (ASC-H) 09/17/2017   Anemia in pregnancy 09/17/2017   Encounter for supervision of normal pregnancy, unspecified, unspecified trimester 09/03/2017   History of cesarean section 09/03/2017   Late prenatal care 09/03/2017   Post-partum depression 04/03/2017   MDD (major depressive disorder), recurrent severe, without psychosis (HCC) 04/02/2017   History  of pelvic fracture 01/28/2017   Social problem 01/02/2017   Tobacco smoking affecting pregnancy, antepartum 10/27/2016

## 2023-09-06 NOTE — ED Provider Notes (Addendum)
 [facility] ASAP Discharge Summary  Date and Time: 09/06/2023 12:16 PM  Name: Jenna [name]  MRN:  [id number]   Discharge Diagnoses:  Final diagnoses:  Stimulant use disorder  Cocaine abuse (HCC)  Substance induced mood disorder (HCC)  Psychoactive substance-induced psychosis (HCC)  Tobacco use disorder  Cannabis use disorder    Subjective: Patient seen and assessed in milieu.  Denies SI/HI/AVH.  Reports eating and sleeping well.  Feels ready for discharge at this time.  Was amenable to continue medications as prescribed in the hospital.  All questions were addressed.  Patient plans to return to [facility] house where her 73 year old child is residing.  Stay Summary:  Patient presented under involuntary commitment due to concern for attempted overdose on Xanax.  There was also concern for medication noncompliance and ongoing substance use including cocaine and THC.  Throughout the entire hospitalization, she denied suicidal ideation.  Her gabapentin was increased to 300 mg 3 times daily as well as being started on mirtazapine to better help with mood and anxiety.  Total Time spent with patient: 30 minutes  Substance Use Hx: Tobacco: vaping and cigarettes for the past 4 years. Smokes about 6 cigarretes  Cannabis:smoking since age 55, blunts/edibles/weed pens. Last use prior to admission Cocaine: using since [age] yo, uses daily, intransal use. 1 gram daily.  Opioids: reports using perocets intermittently   Past Psychiatric Hx: Current psych meds: Denies Dx: pt reports she has bene previously diagnosed with severe anxiety  Prior psych meds: Remeron, hydroxyzine, gabapentin  Outpatient: Denies  Inpatient: Multiple  Non-suicidal self injury: Patient denies  Suicide attempts: Patient denies. Multiple suicide attempts by medication overdose Violence: Denies  Drugs/alcohol: Per chart review cocaine, alcohol, cannabis. Patient denies.  Trauma/neglect/abuse: Yes   Past Medical History: No  current PCP follow-up.  No chronic medical conditions or current medications.  Allergies include a cyclobenzaprine and Haldol   Social History: Homeless, staying at [facility]. Patient reports she has a son Tobacco Cessation:  N/A, patient does not currently use tobacco products  Current Medications:  Current Facility-Administered Medications  Medication Dose Route Frequency Provider Last Rate Last Admin   acetaminophen (TYLENOL) tablet 650 mg  650 mg Oral Q6H PRN Lenox Ponds, NP   650 mg at 09/05/23 1616   alum & mag hydroxide-simeth (MAALOX/MYLANTA) 200-200-20 MG/5ML suspension 30 mL  30 mL Oral Q4H PRN Lenox Ponds, NP       dicyclomine (BENTYL) tablet 20 mg  20 mg Oral Q6H PRN Carrion-Carrero, Margely, MD       gabapentin (NEURONTIN) capsule 300 mg  300 mg Oral TID Miguel Rota, MD   300 mg at 09/06/23 7829   hydrOXYzine (ATARAX) tablet 50 mg  50 mg Oral TID PRN Miguel Rota, MD   50 mg at 09/05/23 1615   loperamide (IMODIUM) capsule 2-4 mg  2-4 mg Oral PRN Carrion-Carrero, Karle Starch, MD       magnesium hydroxide (MILK OF MAGNESIA) suspension 30 mL  30 mL Oral Daily PRN Lenox Ponds, NP       methocarbamol (ROBAXIN) tablet 500 mg  500 mg Oral Q8H PRN Carrion-Carrero, Margely, MD       mirtazapine (REMERON SOL-TAB) disintegrating tablet 15 mg  15 mg Oral QHS Zouev, Dmitri, MD   15 mg at 09/05/23 2018   naproxen (NAPROSYN) tablet 500 mg  500 mg Oral BID PRN Carrion-Carrero, Margely, MD       nicotine (NICODERM CQ - dosed in mg/24 hours) patch  21 mg  21 mg Transdermal Q0600 Lenox Ponds, NP   21 mg at 09/06/23 0929   OLANZapine (ZYPREXA) injection 10 mg  10 mg Intramuscular TID PRN Lenox Ponds, NP       OLANZapine [facility]) injection 5 mg  5 mg Intramuscular TID PRN Lenox Ponds, NP       OLANZapine zydis (ZYPREXA) disintegrating tablet 5 mg  5 mg Oral TID PRN Lenox Ponds, NP       ondansetron (ZOFRAN-ODT) disintegrating tablet 4 mg  4 mg Oral Q6H PRN Carrion-Carrero,  Margely, MD       QUEtiapine (SEROQUEL) tablet 50 mg  50 mg Oral Q6H PRN Carrion-Carrero, Margely, MD   50 mg at 09/05/23 0911   QUEtiapine (SEROQUEL) tablet 50 mg  50 mg Oral BID Lorri Frederick, MD   50 mg at 09/06/23 1610   Current Outpatient Medications  Medication Sig Dispense Refill   gabapentin (NEURONTIN) 300 MG capsule Take 1 capsule (300 mg total) by mouth 3 (three) times daily. 90 capsule 0   hydrOXYzine (ATARAX) 50 MG tablet Take 1 tablet (50 mg total) by mouth 3 (three) times daily as needed for anxiety. 30 tablet 0   ibuprofen (ADVIL) 800 MG tablet Take 1 tablet (800 mg total) by mouth every 8 (eight) hours as needed. (Patient not taking: Reported on 09/02/2023) 30 tablet 0   mirtazapine (REMERON) 15 MG tablet Take 1 tablet (15 mg total) by mouth at bedtime. 30 tablet 0   [START ON 09/07/2023] nicotine (NICODERM CQ - DOSED IN MG/24 HOURS) 21 mg/24hr patch Place 1 patch (21 mg total) onto the skin daily at 6 (six) AM. 28 patch 0   QUEtiapine (SEROQUEL) 50 MG tablet Take 1 tablet (50 mg total) by mouth 2 (two) times daily. 60 tablet 0    PTA Medications:  Facility Ordered Medications  Medication   [COMPLETED] OLANZapine (ZYPREXA) injection 10 mg   [COMPLETED] sterile water (preservative free) injection   acetaminophen (TYLENOL) tablet 650 mg   alum & mag hydroxide-simeth (MAALOX/MYLANTA) 200-200-20 MG/5ML suspension 30 mL   magnesium hydroxide (MILK OF MAGNESIA) suspension 30 mL   OLANZapine zydis (ZYPREXA) disintegrating tablet 5 mg   OLANZapine (ZYPREXA) injection 5 mg   OLANZapine (ZYPREXA) injection 10 mg   nicotine (NICODERM CQ - dosed in mg/24 hours) patch 21 mg   mirtazapine (REMERON SOL-TAB) disintegrating tablet 15 mg   hydrOXYzine (ATARAX) tablet 50 mg   gabapentin (NEURONTIN) capsule 300 mg   dicyclomine (BENTYL) tablet 20 mg   loperamide (IMODIUM) capsule 2-4 mg   methocarbamol (ROBAXIN) tablet 500 mg   naproxen (NAPROSYN) tablet 500 mg   ondansetron  (ZOFRAN-ODT) disintegrating tablet 4 mg   QUEtiapine (SEROQUEL) tablet 50 mg   [COMPLETED] QUEtiapine (SEROQUEL) tablet 50 mg   QUEtiapine (SEROQUEL) tablet 50 mg   PTA Medications  Medication Sig   ibuprofen (ADVIL) 800 MG tablet Take 1 tablet (800 mg total) by mouth every 8 (eight) hours as needed. (Patient not taking: Reported on 09/02/2023)   hydrOXYzine (ATARAX) 50 MG tablet Take 1 tablet (50 mg total) by mouth 3 (three) times daily as needed for anxiety.   [START ON 09/07/2023] nicotine (NICODERM CQ - DOSED IN MG/24 HOURS) 21 mg/24hr patch Place 1 patch (21 mg total) onto the skin daily at 6 (six) AM.   QUEtiapine (SEROQUEL) 50 MG tablet Take 1 tablet (50 mg total) by mouth 2 (two) times daily.   gabapentin (NEURONTIN) 300 MG capsule  Take 1 capsule (300 mg total) by mouth 3 (three) times daily.   mirtazapine (REMERON) 15 MG tablet Take 1 tablet (15 mg total) by mouth at bedtime.       09/06/2023   11:37 AM 09/04/2023   12:23 PM  Depression screen PHQ 2/9  Decreased Interest 1 1  Down, Depressed, Hopeless 1 2  PHQ - 2 Score 2 3  Altered sleeping  1  Tired, decreased energy  0  Change in appetite  1  Feeling bad or failure about yourself   2  Trouble concentrating  0  Moving slowly or fidgety/restless  0  Suicidal thoughts  0  PHQ-9 Score  7  Difficult doing work/chores  Somewhat difficult    Flowsheet Row ED from 09/03/2023 in [facility] ED from 09/02/2023 in [facility] Emergency Department at [facility] ED from 04/22/2023 in [facility] Department at [facility]  C-SSRS RISK CATEGORY No Risk No Risk No Risk       Musculoskeletal  Strength & Muscle Tone: within normal limits Gait & Station: normal Patient leans: N/A  Psychiatric Specialty Exam  Presentation  General Appearance:  Appropriate for Environment; Casual  Eye Contact: Good  Speech: Clear and Coherent; Normal Rate  Speech  Volume: Normal  Handedness: Right   Mood and Affect  Mood: Euthymic  Affect: Appropriate; Congruent   Thought Process  Thought Processes: Coherent; Goal Directed; Linear  Descriptions of Associations:Intact  Orientation:Full (Time, Place and Person)  Thought Content:Logical     Hallucinations:Hallucinations: None  Ideas of Reference:None  Suicidal Thoughts:Suicidal Thoughts: No  Homicidal Thoughts:Homicidal Thoughts: No   Sensorium  Memory: Remote Good  Judgment: Good  Insight: Good   Executive Functions  Concentration: Good  Attention Span: Good  Recall: Good  Fund of Knowledge: Good  Language: Good   Psychomotor Activity  Psychomotor Activity: Psychomotor Activity: Normal   Assets  Assets: Communication Skills; Resilience; Desire for Improvement   Sleep  Sleep: Sleep: Good   No data recorded  Physical Exam  Physical Exam ROS Blood pressure 117/75, pulse 80, temperature 97.9 F (36.6 C), temperature source Oral, resp. rate 18, SpO2 100%. There is no height or weight on file to calculate BMI.  Demographic Factors:  Adolescent or young adult, Low socioeconomic status, and Living alone  Loss Factors: Financial problems/change in socioeconomic status  Historical Factors: Impulsivity  Risk Reduction Factors:   Positive social support, Positive therapeutic relationship, and Positive coping skills or problem solving skills  Continued Clinical Symptoms:  More than one psychiatric diagnosis  Cognitive Features That Contribute To Risk:  None    Suicide Risk:  Minimal: No identifiable suicidal ideation.  Patients presenting with no risk factors but with morbid ruminations; may be classified as minimal risk based on the severity of the depressive symptoms  Plan Of Care/Follow-up recommendations:   Follow-up recommendations:   Activity:  as tolerated Diet:  heart healthy   Comments:  Prescriptions were given at  discharge.  Patient is agreeable with the discharge plan.  Patient was given an opportunity to ask questions.  Patient appears to feel comfortable with discharge and denies any current suicidal or homicidal thoughts.    Patient is instructed prior to discharge to: Take all medications as prescribed by mental healthcare provider. Report any adverse effects and or reactions from the medicines to outpatient provider promptly. In the event of worsening symptoms, patient is instructed to call the crisis hotline, 911 and or  go to the nearest ED for appropriate evaluation and treatment of symptoms. Patient is to follow-up with primary care provider for other medical issues, concerns and or health care needs.   Park Pope, MD 09/06/2023, 12:16 PM

## 2023-09-06 NOTE — ED Notes (Signed)
 Patient sitting in dayroom interacting with peers, patient is eating lunch. No acute distress noted. No concerns voiced. Informed patient to notify staff with any needs or assistance. Patient verbalized understanding or agreement. Safety checks in place per facility policy.

## 2023-09-06 NOTE — ED Notes (Signed)
 Patient is in the bedroom sleeping. NAD. Respirations even and unlabored. Will monitor for safety.

## 2023-09-06 NOTE — ED Notes (Signed)
 Patient sitting in dayroom, calm and composed. No acute distress noted. No concerns voiced. Informed patient to notify staff with any needs or assistance. Patient verbalized understanding or agreement. Safety checks in place per facility policy.

## 2023-09-06 NOTE — ED Notes (Signed)
 Patient discharged home per MD order. After Visit Summary (AVS) printed and given to patient, as well as copy of suicide safety plan. AVS reviewed with patient and all questions fully answered. Patient discharged in no acute distress, A& O x4 and ambulatory. Patient denied SI/HI, A/VH upon discharge. Patient verbalized understanding of all discharge instructions explained by staff, including follow up appointments, RX's and safety plan. Patient mood fair. Patient belongings returned to patient from locker #15 complete and intact. Patient escorted to lobby via staff for transport to destination. Safety maintained.

## 2023-09-06 NOTE — ED Notes (Signed)
 Patient alert & oriented x4. Denies intent to harm self or others when asked. Denies A/VH. Patient denies any physical complaints when asked. No acute distress noted. Scheduled medications administered with no complications. Support and encouragement provided. Routine safety checks conducted per facility protocol. Encouraged patient to notify staff if any thoughts of harm towards self or others arise. Patient verbalizes understanding and agreement.

## 2023-09-06 NOTE — ED Notes (Signed)
 Pt was provided lunch

## 2023-09-06 NOTE — ED Notes (Signed)
 Pt was provided breakfast.

## 2023-09-18 ENCOUNTER — Other Ambulatory Visit: Payer: Self-pay

## 2023-09-18 ENCOUNTER — Encounter (HOSPITAL_COMMUNITY): Payer: Self-pay | Admitting: Behavioral Health

## 2023-09-18 ENCOUNTER — Ambulatory Visit (HOSPITAL_COMMUNITY)
Admission: EM | Admit: 2023-09-18 | Discharge: 2023-09-19 | Disposition: A | Discharge status: Home / Self Care | Payer: MEDICAID | Attending: Behavioral Health | Admitting: Behavioral Health

## 2023-09-18 DIAGNOSIS — G8929 Other chronic pain: Secondary | ICD-10-CM | POA: Insufficient documentation

## 2023-09-18 DIAGNOSIS — F1090 Alcohol use, unspecified, uncomplicated: Secondary | ICD-10-CM | POA: Diagnosis not present

## 2023-09-18 DIAGNOSIS — F1910 Other psychoactive substance abuse, uncomplicated: Secondary | ICD-10-CM

## 2023-09-18 DIAGNOSIS — F1410 Cocaine abuse, uncomplicated: Secondary | ICD-10-CM | POA: Insufficient documentation

## 2023-09-18 DIAGNOSIS — F1290 Cannabis use, unspecified, uncomplicated: Secondary | ICD-10-CM | POA: Insufficient documentation

## 2023-09-18 DIAGNOSIS — I498 Other specified cardiac arrhythmias: Secondary | ICD-10-CM | POA: Insufficient documentation

## 2023-09-18 DIAGNOSIS — Z79899 Other long term (current) drug therapy: Secondary | ICD-10-CM | POA: Insufficient documentation

## 2023-09-18 DIAGNOSIS — Z560 Unemployment, unspecified: Secondary | ICD-10-CM | POA: Insufficient documentation

## 2023-09-18 DIAGNOSIS — G5683 Other specified mononeuropathies of bilateral upper limbs: Secondary | ICD-10-CM | POA: Insufficient documentation

## 2023-09-18 DIAGNOSIS — F319 Bipolar disorder, unspecified: Secondary | ICD-10-CM | POA: Insufficient documentation

## 2023-09-18 DIAGNOSIS — Z5900 Homelessness unspecified: Secondary | ICD-10-CM | POA: Insufficient documentation

## 2023-09-18 DIAGNOSIS — Z5986 Financial insecurity: Secondary | ICD-10-CM | POA: Insufficient documentation

## 2023-09-18 DIAGNOSIS — F1490 Cocaine use, unspecified, uncomplicated: Secondary | ICD-10-CM

## 2023-09-18 DIAGNOSIS — Z653 Problems related to other legal circumstances: Secondary | ICD-10-CM | POA: Insufficient documentation

## 2023-09-18 DIAGNOSIS — Z91141 Patient's other noncompliance with medication regimen due to financial hardship: Secondary | ICD-10-CM | POA: Insufficient documentation

## 2023-09-18 DIAGNOSIS — Z9151 Personal history of suicidal behavior: Secondary | ICD-10-CM | POA: Insufficient documentation

## 2023-09-18 LAB — LIPID PANEL
Cholesterol: 118 mg/dL (ref 0–200)
HDL: 54 mg/dL (ref >40)
LDL Cholesterol: 55 mg/dL (ref 0–99)
Total CHOL/HDL Ratio: 2.2 ratio
Triglycerides: 46 mg/dL (ref <150)
VLDL: 9 mg/dL (ref 0–40)

## 2023-09-18 LAB — CBC WITH DIFFERENTIAL/PLATELET
Abs Immature Granulocytes: 0.05 10*3/uL (ref 0.00–0.07)
Basophils Absolute: 0.1 10*3/uL (ref 0.0–0.1)
Basophils Relative: 1 %
Eosinophils Absolute: 0.4 10*3/uL (ref 0.0–0.5)
Eosinophils Relative: 3 %
HCT: 35.9 % — ABNORMAL LOW (ref 36.0–46.0)
Hemoglobin: 11.1 g/dL — ABNORMAL LOW (ref 12.0–15.0)
Immature Granulocytes: 0 %
Lymphocytes Relative: 26 %
Lymphs Abs: 3.1 10*3/uL (ref 0.7–4.0)
MCH: 26.1 pg (ref 26.0–34.0)
MCHC: 30.9 g/dL (ref 30.0–36.0)
MCV: 84.5 fL (ref 80.0–100.0)
Monocytes Absolute: 0.9 10*3/uL (ref 0.1–1.0)
Monocytes Relative: 8 %
Neutro Abs: 7.6 10*3/uL (ref 1.7–7.7)
Neutrophils Relative %: 62 %
Platelets: 370 10*3/uL (ref 150–400)
RBC: 4.25 MIL/uL (ref 3.87–5.11)
RDW: 18.1 % — ABNORMAL HIGH (ref 11.5–15.5)
WBC: 12.1 10*3/uL — ABNORMAL HIGH (ref 4.0–10.5)
nRBC: 0 % (ref 0.0–0.2)

## 2023-09-18 LAB — MAGNESIUM: Magnesium: 1.9 mg/dL (ref 1.7–2.4)

## 2023-09-18 LAB — COMPREHENSIVE METABOLIC PANEL
ALT: 18 U/L (ref 0–44)
AST: 27 U/L (ref 15–41)
Albumin: 3.7 g/dL (ref 3.5–5.0)
Alkaline Phosphatase: 54 U/L (ref 38–126)
Anion gap: 7 (ref 5–15)
BUN: 11 mg/dL (ref 6–20)
CO2: 29 mmol/L (ref 22–32)
Calcium: 8.9 mg/dL (ref 8.9–10.3)
Chloride: 103 mmol/L (ref 98–111)
Creatinine, Ser: 0.75 mg/dL (ref 0.44–1.00)
GFR, Estimated: >60 mL/min (ref >60)
Glucose, Bld: 77 mg/dL (ref 70–99)
Potassium: 4 mmol/L (ref 3.5–5.1)
Sodium: 139 mmol/L (ref 135–145)
Total Bilirubin: 0.4 mg/dL (ref 0.0–1.2)
Total Protein: 7.2 g/dL (ref 6.5–8.1)

## 2023-09-18 LAB — ETHANOL: Alcohol, Ethyl (B): <10 mg/dL (ref <10)

## 2023-09-18 LAB — POCT URINE DRUG SCREEN - MANUAL ENTRY (I-SCREEN)
POC Amphetamine UR: POSITIVE — AB
POC Buprenorphine (BUP): NOT DETECTED
POC Cocaine UR: NOT DETECTED
POC Marijuana UR: NOT DETECTED
POC Methadone UR: NOT DETECTED
POC Methamphetamine UR: NOT DETECTED
POC Morphine: NOT DETECTED
POC Oxazepam (BZO): NOT DETECTED
POC Oxycodone UR: NOT DETECTED
POC Secobarbital (BAR): NOT DETECTED

## 2023-09-18 LAB — HEMOGLOBIN A1C
Hgb A1c MFr Bld: 5.3 % (ref 4.8–5.6)
Mean Plasma Glucose: 105.41 mg/dL

## 2023-09-18 LAB — POC URINE PREG, ED: Preg Test, Ur: NEGATIVE

## 2023-09-18 LAB — TSH: TSH: 0.852 u[IU]/mL (ref 0.350–4.500)

## 2023-09-18 MED ORDER — MIRTAZAPINE 15 MG PO TABS
15.0000 mg | ORAL_TABLET | Freq: Every day | ORAL | Status: DC
  Filled 2023-09-18: qty 1

## 2023-09-18 MED ORDER — ADULT MULTIVITAMIN W/MINERALS CH
1.0000 | ORAL_TABLET | Freq: Every day | ORAL | Status: DC
  Administered 2023-09-18 – 2023-09-19 (×2): 1 via ORAL
  Filled 2023-09-18 (×2): qty 1

## 2023-09-18 MED ORDER — GABAPENTIN 300 MG PO CAPS
300.0000 mg | ORAL_CAPSULE | Freq: Three times a day (TID) | ORAL | Status: DC
  Administered 2023-09-19: 300 mg via ORAL
  Filled 2023-09-18 (×2): qty 1

## 2023-09-18 MED ORDER — LOPERAMIDE HCL 2 MG PO CAPS: 2.0000 mg | ORAL_CAPSULE | ORAL | Status: DC | PRN

## 2023-09-18 MED ORDER — TRAZODONE HCL 50 MG PO TABS
50.0000 mg | ORAL_TABLET | Freq: Every evening | ORAL | Status: DC | PRN
  Filled 2023-09-18: qty 1

## 2023-09-18 MED ORDER — LORAZEPAM 1 MG PO TABS: 1.0000 mg | ORAL_TABLET | Freq: Four times a day (QID) | ORAL | Status: DC | PRN

## 2023-09-18 MED ORDER — LORAZEPAM 1 MG PO TABS
1.0000 mg | ORAL_TABLET | Freq: Four times a day (QID) | ORAL | Status: DC
  Administered 2023-09-18: 1 mg via ORAL
  Filled 2023-09-18 (×2): qty 1

## 2023-09-18 MED ORDER — LORAZEPAM 1 MG PO TABS: 1.0000 mg | ORAL_TABLET | Freq: Two times a day (BID) | ORAL | Status: DC

## 2023-09-18 MED ORDER — HYDROXYZINE HCL 25 MG PO TABS: 25.0000 mg | ORAL_TABLET | Freq: Three times a day (TID) | ORAL | Status: DC | PRN

## 2023-09-18 MED ORDER — THIAMINE MONONITRATE 100 MG PO TABS
100.0000 mg | ORAL_TABLET | Freq: Every day | ORAL | Status: DC
  Administered 2023-09-19: 100 mg via ORAL
  Filled 2023-09-18: qty 1

## 2023-09-18 MED ORDER — ALUM & MAG HYDROXIDE-SIMETH 200-200-20 MG/5ML PO SUSP
30.0000 mL | ORAL | Status: DC | PRN
Start: 2023-09-18 — End: 2023-09-19

## 2023-09-18 MED ORDER — LORAZEPAM 1 MG PO TABS: 1.0000 mg | ORAL_TABLET | Freq: Three times a day (TID) | ORAL | Status: DC

## 2023-09-18 MED ORDER — ONDANSETRON 4 MG PO TBDP: 4.0000 mg | ORAL_TABLET | Freq: Four times a day (QID) | ORAL | Status: DC | PRN

## 2023-09-18 MED ORDER — ACETAMINOPHEN 325 MG PO TABS: 650.0000 mg | ORAL_TABLET | Freq: Four times a day (QID) | ORAL | Status: DC | PRN

## 2023-09-18 MED ORDER — HYDROXYZINE HCL 25 MG PO TABS: 25.0000 mg | ORAL_TABLET | Freq: Four times a day (QID) | ORAL | Status: DC | PRN

## 2023-09-18 MED ORDER — OLANZAPINE 10 MG IM SOLR: 5.0000 mg | Freq: Three times a day (TID) | INTRAMUSCULAR | Status: DC | PRN

## 2023-09-18 MED ORDER — MAGNESIUM HYDROXIDE 400 MG/5ML PO SUSP
30.0000 mL | Freq: Every day | ORAL | Status: DC | PRN
Start: 2023-09-18 — End: 2023-09-19

## 2023-09-18 MED ORDER — QUETIAPINE FUMARATE 50 MG PO TABS
50.0000 mg | ORAL_TABLET | Freq: Every day | ORAL | Status: DC
  Filled 2023-09-18: qty 1

## 2023-09-18 MED ORDER — OLANZAPINE 5 MG PO TBDP: 5.0000 mg | ORAL_TABLET | Freq: Three times a day (TID) | ORAL | Status: DC | PRN

## 2023-09-18 MED ORDER — OLANZAPINE 10 MG IM SOLR: 10.0000 mg | Freq: Three times a day (TID) | INTRAMUSCULAR | Status: DC | PRN

## 2023-09-18 MED ORDER — LORAZEPAM 1 MG PO TABS: 1.0000 mg | ORAL_TABLET | Freq: Every day | ORAL | Status: DC

## 2023-09-18 NOTE — [facility] Nurse Program (Signed)
 Client to RN office she is requesting drug and rehab services to withdraw from substance abuse. She said she recently tried but she states she wasn't fully ready and was afraid. She is today and wants help with going to rehab. She says that she was diagnosed with bipolar and didn't have any funds to pick up prescription. RN will assist with medications. RN will [facility] for [facility] services and assistance to get into rehab facility. RN has given her two pages of resources and options to call around and see which program best fits her while in hospital and either hospital case manager can arrange or she will come back to [facility] for RN to arrange. She is a part of [facility] and her case worker Hospital doctor was made aware. Client will be sent to [facility] via safe transport.

## 2023-09-18 NOTE — ED Notes (Signed)
 Patient is resting on the recliner with eyes closed without any distress. She denies SIHI, AVH, and anxiety and depression. She is wearing her personal clothing. No issues observed or reported. Staff will continue to monitor for safety and for changes in condition.

## 2023-09-18 NOTE — ED Provider Notes (Signed)
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

## 2023-09-18 NOTE — ED Notes (Signed)
 Patient admitted to observation unit. Patient presents flat and asking for help for substance abuse. Patient reports chills and received scheduled ativan for withdrawal symptoms.  Cow score 5. Patient was also provided with blankets and dinner. Patient denies SI,HI, and A/V/H with no plan or intent. Patient reports wanting rehab. Patient remains cooperative on unit with no s/s of current distress.
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
## 2023-09-18 NOTE — ED Notes (Signed)
 Patient refused Gabapentin 300 mg, Remeron 15 mg, and Seroquel 50 mg PO. Patient has been sleeping since arrival to the shift. Staff will continue to monitor for safety and for changes in condition.

## 2023-09-19 ENCOUNTER — Telehealth: Payer: Self-pay

## 2023-09-19 LAB — RPR: RPR Ser Ql: NONREACTIVE

## 2023-09-19 MED ORDER — QUETIAPINE FUMARATE 50 MG PO TABS: 50.0000 mg | ORAL_TABLET | Freq: Two times a day (BID) | ORAL | Status: DC

## 2023-09-19 NOTE — Discharge Instructions (Addendum)
 Discharge recommendations:   Medications: Patient is to take medications as prescribed. The patient or patient's guardian is to contact a medical professional and/or outpatient provider to address any new side effects that develop. The patient or the patient's guardian should update outpatient providers of any new medications and/or medication changes.   Outpatient Follow up: Please review list of outpatient resources for psychiatry and counseling. Please follow up with your primary care provider for all medical related needs.   You are encouraged to follow up with [facility] for outpatient treatment.  Walk in/ Open Access Hours: Monday - Friday 8AM - 11AM   [facility] [address] [place], [place] [phone]  Therapy: We recommend that patient participate in individual therapy to address mental health concerns.  Atypical antipsychotics: If you are prescribed an atypical antipsychotic, it is recommended that your height, weight, BMI, blood pressure, fasting lipid panel, and fasting blood sugar be monitored by your outpatient providers.  Safety:   The following safety precautions should be taken:   No sharp objects. This includes scissors, razors, scrapers, and putty knives.   Chemicals should be removed and locked up.   Medications should be removed and locked up.   Weapons should be removed and locked up. This includes firearms, knives and instruments that can be used to cause injury.   The patient should abstain from use of illicit substances/drugs and abuse of any medications.  If symptoms worsen or do not continue to improve or if the patient becomes actively suicidal or homicidal then it is recommended that the patient return to the closest hospital emergency department, the [facility], or call 911 for further evaluation and treatment. National Suicide Prevention Lifeline 1-800-SUICIDE or  671-615-5798.  About 988 988 offers 24/7 access to trained crisis counselors who can help people experiencing mental health-related distress. People can call or text 988 or chat 988lifeline.org for themselves or if they are worried about a loved one who may need crisis support.

## 2023-09-19 NOTE — Progress Notes (Signed)
 Pt woke very labile and argumentative. Pt was demanding Seroquel and stated that she want to sleep. Pt became argumentative when she was informed that medication was scheduled for bedtime. Pt  was not receptive or able to redirect. Administered scheduled meds per order. White, NP was notified. Pt denies current SI/HI/AVH, plan or intent. Staff will monitor for pt's safety.

## 2023-09-19 NOTE — Discharge Summary (Signed)
 Jenna [name] to be D/C'd Home per NP order. Discussed with the patient and all questions fully answered. An After Visit Summary was printed and given to the patient. All belongings returned. Patient escorted and D/C home via private auto.  Dickie La  09/19/2023 11:02 AM

## 2023-09-19 NOTE — Progress Notes (Signed)
 Pt is asleep. Respirations are even and unlabored. No signs of acute distress noted. Staff will monitor for pt's safety.

## 2023-09-19 NOTE — ED Provider Notes (Signed)
 [facility]BC/OBS ASAP Discharge Summary  Date and Time: 09/19/2023 10:56 AM  Name: Jenna [name]  MRN:  [id number]   Discharge Diagnoses:  Final diagnoses:  Cocaine use  Substance abuse (HCC)    Subjective: Patient seen and evaluated face-to-face by this provider, chart reviewed and case discussed with Dr. Enedina Finner.   On evaluation, patient observed lying down in bed in no acute distress. She is alert and oriented x 4. Her thought process is linear and speech is clear and coherent at a decreased time. Her mood is irritable and affect is congruent. Patient denies suicidal ideations. She denies homicidal ideations. She denies depressive symptoms.  She denies auditory or visual hallucinations. There is no objective evidence that the patient is currently responding to internal or external stimuli, or experiencing delusional or paranoid thought content. Patient states that she is here because she needs rehab for cocaine use. She reports using cocaine since she was 28 years old. She reports using cocaine every day and states that she does not know how much she uses on average. She reports last using cocaine yesterday. In addition, she reports using marijuana every day. She denies drinking alcohol. She states that she is homeless. She is unemployed. She denies legal issues. She states that she has 2 boys who live with their father. She denies physical complaints.   Later this morning, nursing notified me that the patient was disruptive on the unit and argumentative toward staff as she was demanding to take Seroquel in the morning to sleep. According to chart review, patient was previously prescribed Seroquel 50 mg twice daily. This provider modified the patient's Seroquel 25 mg twice daily. This provider went to speak with the patient on the unit along with the nursing staff to discuss medication regime and disruptive behaviors on the milieu. Patient noted to be irritable and argumentative. I explained to the patient  that the Seroquel was increased to 50 mg twice daily as previously prescribed. I explained to the patient that there are no available beds in the facility based crisis center and that I am currently working with the social worker for residential treatment programs as there is no detox for cocaine use. Patient states that she was told that she would need to be here for 7 days before she can go to a residential facility. I explained to the patient that she would complete a screening for residential treatment at [facility] on Monday. Patient states that if she's not going to detox then she does not need to wait here and can leave. I dicussed with the patient outpatient resources for psychiatry here at the [facility] outpatient) and follow-up information for [facility] screening for residential services. I also contacted the patient's pharmacy at [facility] to confirm if patient's prescriptions were picked up from when they were previously prescribed on March 8. According to the pharmacy technician, the patient had not picked up her medication from the  pharmacy. Patient was encouraged to pick up her medication from the pharmacy. Safety planning completed prior to discharge. Patient stable to discharge.   Stay Summary: Jenna [name] is a 28 year old female patient with a past psychiatric history significant for MDD, alcohol abuse, substance induced mood, and cocaine abuse who presented to the [facility] voluntary on 09/18/2023 requesting substance abuse treatment for cocaine, alcohol, and marijuana use.   Per chart review, patient recently hospitalized here ([facility]) from 09/03/23 to 09/06/23 for substance abuse.    Total Time spent with patient: 30  minutes  Past Psychiatric History: History of MDD, alcohol abuse, substance induced mood, and cocaine abuse. Patient recently hospitalized here ([facility]) from 09/03/23 to 09/06/23 for substance abuse.   Past Medical History: No reported history.    Family Psychiatric History: No reported history.   Social History: Homelessness. Unemployed. Cocaine use.   Tobacco Cessation:  Prescription not provided because: patient has an active prescription at the pharmacy  Current Medications:  Current Facility-Administered Medications  Medication Dose Route Frequency Provider Last Rate Last Admin   acetaminophen (TYLENOL) tablet 650 mg  650 mg Oral Q6H PRN Bennett, Christal H, NP       alum & mag hydroxide-simeth (MAALOX/MYLANTA) 200-200-20 MG/5ML suspension 30 mL  30 mL Oral Q4H PRN Bennett, Christal H, NP       gabapentin (NEURONTIN) capsule 300 mg  300 mg Oral TID Bennett, Christal H, NP   300 mg at 09/19/23 1025   hydrOXYzine (ATARAX) tablet 25 mg  25 mg Oral Q6H PRN Bennett, Christal H, NP       loperamide (IMODIUM) capsule 2-4 mg  2-4 mg Oral PRN Bennett, Christal H, NP       LORazepam (ATIVAN) tablet 1 mg  1 mg Oral Q6H PRN Bennett, Christal H, NP       magnesium hydroxide (MILK OF MAGNESIA) suspension 30 mL  30 mL Oral Daily PRN Bennett, Christal H, NP       mirtazapine (REMERON) tablet 15 mg  15 mg Oral QHS Bennett, Christal H, NP       multivitamin with minerals tablet 1 tablet  1 tablet Oral Daily Bennett, Christal H, NP   1 tablet at 09/19/23 1025   OLANZapine (ZYPREXA) injection 10 mg  10 mg Intramuscular TID PRN Bennett, Christal H, NP       OLANZapine (ZYPREXA) injection 5 mg  5 mg Intramuscular TID PRN Bennett, Christal H, NP       OLANZapine zydis (ZYPREXA) disintegrating tablet 5 mg  5 mg Oral TID PRN Bennett, Christal H, NP       ondansetron (ZOFRAN-ODT) disintegrating tablet 4 mg  4 mg Oral Q6H PRN Bennett, Christal H, NP       QUEtiapine (SEROQUEL) tablet 50 mg  50 mg Oral BID Augustus Zurawski L, NP       thiamine (VITAMIN B1) tablet 100 mg  100 mg Oral Daily Bennett, Christal H, NP   100 mg at 09/19/23 1024   traZODone (DESYREL) tablet 50 mg  50 mg Oral QHS PRN Bennett, Christal H, NP       Current Outpatient Medications   Medication Sig Dispense Refill   gabapentin (NEURONTIN) 300 MG capsule Take 1 capsule (300 mg total) by mouth 3 (three) times daily. 90 capsule 0   hydrOXYzine (ATARAX) 50 MG tablet Take 1 tablet (50 mg total) by mouth 3 (three) times daily as needed for anxiety. 30 tablet 0   mirtazapine (REMERON) 15 MG tablet Take 1 tablet (15 mg total) by mouth at bedtime. 30 tablet 0   nicotine (NICODERM CQ - DOSED IN MG/24 HOURS) 21 mg/24hr patch Place 1 patch (21 mg total) onto the skin daily at 6 (six) AM. 28 patch 0   QUEtiapine (SEROQUEL) 50 MG tablet Take 1 tablet (50 mg total) by mouth 2 (two) times daily. 60 tablet 0    PTA Medications:  Facility Ordered Medications  Medication   acetaminophen (TYLENOL) tablet 650 mg   alum & mag hydroxide-simeth (MAALOX/MYLANTA) 200-200-20 MG/5ML suspension 30 mL  magnesium hydroxide (MILK OF MAGNESIA) suspension 30 mL   OLANZapine zydis (ZYPREXA) disintegrating tablet 5 mg   OLANZapine (ZYPREXA) injection 5 mg   OLANZapine (ZYPREXA) injection 10 mg   traZODone (DESYREL) tablet 50 mg   thiamine (VITAMIN B1) tablet 100 mg   multivitamin with minerals tablet 1 tablet   LORazepam (ATIVAN) tablet 1 mg   hydrOXYzine (ATARAX) tablet 25 mg   loperamide (IMODIUM) capsule 2-4 mg   ondansetron (ZOFRAN-ODT) disintegrating tablet 4 mg   mirtazapine (REMERON) tablet 15 mg   gabapentin (NEURONTIN) capsule 300 mg   QUEtiapine (SEROQUEL) tablet 50 mg   PTA Medications  Medication Sig   hydrOXYzine (ATARAX) 50 MG tablet Take 1 tablet (50 mg total) by mouth 3 (three) times daily as needed for anxiety.   nicotine (NICODERM CQ - DOSED IN MG/24 HOURS) 21 mg/24hr patch Place 1 patch (21 mg total) onto the skin daily at 6 (six) AM.   QUEtiapine (SEROQUEL) 50 MG tablet Take 1 tablet (50 mg total) by mouth 2 (two) times daily.   gabapentin (NEURONTIN) 300 MG capsule Take 1 capsule (300 mg total) by mouth 3 (three) times daily.   mirtazapine (REMERON) 15 MG tablet Take 1  tablet (15 mg total) by mouth at bedtime.       09/06/2023   11:37 AM 09/04/2023   12:23 PM  Depression screen PHQ 2/9  Decreased Interest 1 1  Down, Depressed, Hopeless 1 2  PHQ - 2 Score 2 3  Altered sleeping  1  Tired, decreased energy  0  Change in appetite  1  Feeling bad or failure about yourself   2  Trouble concentrating  0  Moving slowly or fidgety/restless  0  Suicidal thoughts  0  PHQ-9 Score  7  Difficult doing work/chores  Somewhat difficult    Flowsheet Row ED from 09/18/2023 in [facility] ED from 09/03/2023 in [facility] ED from 09/02/2023 in [facility] Emergency Department at [facility]  C-SSRS RISK CATEGORY No Risk No Risk No Risk       Musculoskeletal  Strength & Muscle Tone: within normal limits Gait & Station: normal Patient leans: N/A  Psychiatric Specialty Exam  Presentation  General Appearance:  Casual; Fairly Groomed  Eye Contact: Fair  Speech: Clear and Coherent; Slow  Speech Volume: Normal  Handedness: Right   Mood and Affect  Mood: Euthymic  Affect: Appropriate; Congruent   Thought Process  Thought Processes: Coherent; Linear  Descriptions of Associations:Intact  Orientation:Full (Time, Place and Person)  Thought Content:Logical  Diagnosis of Schizophrenia or Schizoaffective disorder in past: No    Hallucinations:Hallucinations: None  Ideas of Reference:None  Suicidal Thoughts:Suicidal Thoughts: No  Homicidal Thoughts:Homicidal Thoughts: No   Sensorium  Memory: Immediate Good; Recent Good  Judgment: Poor  Insight: Fair   Art therapist  Concentration: Fair  Attention Span: Fair  Recall: Good  Fund of Knowledge: Good  Language: Good   Psychomotor Activity  Psychomotor Activity: Psychomotor Activity: Restlessness   Assets  Assets: Communication Skills; Desire for Improvement; Resilience   Sleep  Sleep: Sleep:  Poor   Nutritional Assessment (For OBS and FBC admissions only) Has the patient had a weight loss or gain of 10 pounds or more in the last 3 months?: No Has the patient had a decrease in food intake/or appetite?: No Does the patient have dental problems?: No Does the patient have eating habits or behaviors that may be indicators of an  eating disorder including binging or inducing vomiting?: No Has the patient recently lost weight without trying?: 0 Has the patient been eating poorly because of a decreased appetite?: 0 Malnutrition Screening Tool Score: 0    Physical Exam  Physical Exam Cardiovascular:     Rate and Rhythm: Normal rate.  Pulmonary:     Effort: Pulmonary effort is normal.  Musculoskeletal:        General: Normal range of motion.  Neurological:     Mental Status: She is alert and oriented to person, place, and time.    Review of Systems  HENT: Negative.    Eyes: Negative.   Respiratory: Negative.    Cardiovascular: Negative.   Gastrointestinal: Negative.   Genitourinary: Negative.   Musculoskeletal: Negative.   Neurological: Negative.   Endo/Heme/Allergies: Negative.   Psychiatric/Behavioral:  Positive for substance abuse.    Blood pressure (!) 130/92, pulse 75, temperature 98.4 F (36.9 C), temperature source Oral, resp. rate 18, SpO2 99%. There is no height or weight on file to calculate BMI.   Plan Of Care/Follow-up recommendations:   Discharge recommendations:   Medications: Patient is to take medications as prescribed. The patient or patient's guardian is to contact a medical professional and/or outpatient provider to address any new side effects that develop. The patient or the patient's guardian should update outpatient providers of any new medications and/or medication changes.   Outpatient Follow up: Please review list of outpatient resources for psychiatry and counseling. Please follow up with your primary care provider for all medical related  needs.   You are encouraged to follow up with [facility] for outpatient treatment.  Walk in/ Open Access Hours: Monday - Friday 8AM - 11AM   [facility] [address] [place], [place] [phone]  Therapy: We recommend that patient participate in individual therapy to address mental health concerns.  Atypical antipsychotics: If you are prescribed an atypical antipsychotic, it is recommended that your height, weight, BMI, blood pressure, fasting lipid panel, and fasting blood sugar be monitored by your outpatient providers.  Substance abuse: Please follow up with list of outpatient and residential resources for substance abuse treatment options.   Safety:   The following safety precautions should be taken:   No sharp objects. This includes scissors, razors, scrapers, and putty knives.   Chemicals should be removed and locked up.   Medications should be removed and locked up.   Weapons should be removed and locked up. This includes firearms, knives and instruments that can be used to cause injury.   The patient should abstain from use of illicit substances/drugs and abuse of any medications.  If symptoms worsen or do not continue to improve or if the patient becomes actively suicidal or homicidal then it is recommended that the patient return to the closest hospital emergency department, the [facility], or call 911 for further evaluation and treatment. National Suicide Prevention Lifeline 1-800-SUICIDE or 402 455 9151.  About 988 988 offers 24/7 access to trained crisis counselors who can help people experiencing mental health-related distress. People can call or text 988 or chat 988lifeline.org for themselves or if they are worried about a loved one who may need crisis support.    Disposition: discharge  Layla Barter, NP 09/19/2023, 10:56 AM

## 2023-09-19 NOTE — ED Notes (Signed)
 Patient is resting with eyes closed without any distress noted. Staff will continue to monitor for safety and for changes in condition.

## 2023-09-19 NOTE — Telephone Encounter (Signed)
 Att to contact pt to schedule f/u ED appt. No ans.Unable to lvm Pt self-admitted to behavioral health today.

## 2023-10-22 ENCOUNTER — Encounter (HOSPITAL_COMMUNITY): Payer: Self-pay

## 2023-10-23 ENCOUNTER — Telehealth: Payer: Self-pay

## 2023-10-23 NOTE — Telephone Encounter (Signed)
 Att to contact pt to schedule annual physical no ans lvm

## 2023-10-27 NOTE — Progress Notes (Signed)
 The patient attended a virtual primary care appt on 08/14/23 where her BP screening results were 148/93.  At the appt the patient noted she has food, housing, and transportation insecurities. Pt also noted ipv. Patient was given [facility] Resources. Pt also gave CHW consent to do a 360 referral.    Per chart review the pt has had multiple visits to the ED after her primary care appt. According to the chart the pt no longer has IPV or transportation as an insecurity. Chart review revealed that CMA Pamla Boettcher has made multiple call attempts to schedule follow up appts and has had no success.  Chw attempted to contact pt multiple times on different numbers in the pt's chart. The pt did not answer and a VM was left each time. Chw put in a [facility] referral for tailored care management. The pt is un-housed and suffers from substance abuse, a care manager could help out with those sdoh insecurities.  Chart review also indicates a future BH appt on 12/04/23. An abnormal results letter and sdoh resources were sent. An additional follow up will be done in according to the health equity team's protocol.

## 2023-10-28 ENCOUNTER — Ambulatory Visit (HOSPITAL_COMMUNITY): Payer: MEDICAID | Admitting: Clinical

## 2023-10-31 ENCOUNTER — Telehealth: Payer: Self-pay

## 2023-10-31 NOTE — Telephone Encounter (Signed)
 Called to f/u on sdoh insecurites. Pt did not answer and a VM was left.

## 2023-11-06 ENCOUNTER — Other Ambulatory Visit: Payer: Self-pay

## 2023-11-06 ENCOUNTER — Emergency Department (HOSPITAL_COMMUNITY)
Admission: EM | Admit: 2023-11-06 | Discharge: 2023-11-06 | Disposition: A | Discharge status: Home / Self Care | Payer: MEDICAID | Attending: Emergency Medicine | Admitting: Emergency Medicine

## 2023-11-06 ENCOUNTER — Encounter (HOSPITAL_COMMUNITY): Payer: Self-pay

## 2023-11-06 ENCOUNTER — Emergency Department (HOSPITAL_COMMUNITY): Payer: MEDICAID

## 2023-11-06 DIAGNOSIS — F17210 Nicotine dependence, cigarettes, uncomplicated: Secondary | ICD-10-CM | POA: Insufficient documentation

## 2023-11-06 DIAGNOSIS — S61411A Laceration without foreign body of right hand, initial encounter: Secondary | ICD-10-CM | POA: Insufficient documentation

## 2023-11-06 DIAGNOSIS — Z23 Encounter for immunization: Secondary | ICD-10-CM | POA: Insufficient documentation

## 2023-11-06 DIAGNOSIS — W260XXA Contact with knife, initial encounter: Secondary | ICD-10-CM | POA: Diagnosis not present

## 2023-11-06 DIAGNOSIS — S6991XA Unspecified injury of right wrist, hand and finger(s), initial encounter: Secondary | ICD-10-CM | POA: Diagnosis present

## 2023-11-06 MED ORDER — TETANUS-DIPHTH-ACELL PERTUSSIS 5-2.5-18.5 LF-MCG/0.5 IM SUSY
0.5000 mL | PREFILLED_SYRINGE | Freq: Once | INTRAMUSCULAR | Status: AC
  Administered 2023-11-06: 0.5 mL via INTRAMUSCULAR
  Filled 2023-11-06: qty 0.5

## 2023-11-06 MED ORDER — OXYCODONE-ACETAMINOPHEN 5-325 MG PO TABS
1.0000 | ORAL_TABLET | Freq: Once | ORAL | Status: AC
  Administered 2023-11-06: 1 via ORAL
  Filled 2023-11-06: qty 1

## 2023-11-06 MED ORDER — LIDOCAINE HCL (PF) 1 % IJ SOLN
5.0000 mL | Freq: Once | INTRAMUSCULAR | Status: AC
  Administered 2023-11-06: 5 mL
  Filled 2023-11-06: qty 5

## 2023-11-06 MED ORDER — ACETAMINOPHEN 500 MG PO TABS
1000.0000 mg | ORAL_TABLET | Freq: Once | ORAL | Status: DC
  Filled 2023-11-06: qty 2

## 2023-11-06 NOTE — Discharge Instructions (Signed)
 Jenna [name]:  Thank you for allowing us  to take care of you today.  We hope you begin feeling better soon. You were seen today for hand laceration. We placed 3 sutures. These need to be removed in 7-10 days.  To-Do:  Please follow-up with your primary doctor within the next 2-3 days. It is important that you review any labs or imaging results (if any) that you had today with them. Your preliminary imaging results (if any) are attached. Please return to the Emergency Department or call 911 if you experience chest pain, shortness of breath, severe pain, severe fever, altered mental status, or have any reason to think that you need emergency medical care.  Thank you again.  Hope you feel better soon.  Arminda Landmark, MD Department of Emergency Medicine

## 2023-11-06 NOTE — ED Notes (Signed)
 Patient to X-ray

## 2023-11-06 NOTE — ED Notes (Signed)
Pt was seen walking out the front door
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
## 2023-11-10 ENCOUNTER — Encounter (HOSPITAL_COMMUNITY): Payer: Self-pay

## 2023-11-10 ENCOUNTER — Emergency Department (HOSPITAL_COMMUNITY)
Admission: EM | Admit: 2023-11-10 | Discharge: 2023-11-11 | Disposition: A | Discharge status: Home / Self Care | Payer: MEDICAID | Attending: Emergency Medicine | Admitting: Emergency Medicine

## 2023-11-10 ENCOUNTER — Other Ambulatory Visit: Payer: Self-pay

## 2023-11-10 DIAGNOSIS — S61011D Laceration without foreign body of right thumb without damage to nail, subsequent encounter: Secondary | ICD-10-CM | POA: Diagnosis not present

## 2023-11-10 DIAGNOSIS — W260XXD Contact with knife, subsequent encounter: Secondary | ICD-10-CM | POA: Insufficient documentation

## 2023-11-10 DIAGNOSIS — L03011 Cellulitis of right finger: Secondary | ICD-10-CM | POA: Diagnosis not present

## 2023-11-10 DIAGNOSIS — S61411D Laceration without foreign body of right hand, subsequent encounter: Secondary | ICD-10-CM | POA: Diagnosis present

## 2023-11-10 LAB — CBC WITH DIFFERENTIAL/PLATELET
Abs Immature Granulocytes: 0.09 10*3/uL — ABNORMAL HIGH (ref 0.00–0.07)
Basophils Absolute: 0 10*3/uL (ref 0.0–0.1)
Basophils Relative: 0 %
Eosinophils Absolute: 0.3 10*3/uL (ref 0.0–0.5)
Eosinophils Relative: 2 %
HCT: 33.8 % — ABNORMAL LOW (ref 36.0–46.0)
Hemoglobin: 10.9 g/dL — ABNORMAL LOW (ref 12.0–15.0)
Immature Granulocytes: 1 %
Lymphocytes Relative: 11 %
Lymphs Abs: 1.5 10*3/uL (ref 0.7–4.0)
MCH: 27 pg (ref 26.0–34.0)
MCHC: 32.2 g/dL (ref 30.0–36.0)
MCV: 83.9 fL (ref 80.0–100.0)
Monocytes Absolute: 0.9 10*3/uL (ref 0.1–1.0)
Monocytes Relative: 6 %
Neutro Abs: 11.9 10*3/uL — ABNORMAL HIGH (ref 1.7–7.7)
Neutrophils Relative %: 80 %
Platelets: 235 10*3/uL (ref 150–400)
RBC: 4.03 MIL/uL (ref 3.87–5.11)
RDW: 19.2 % — ABNORMAL HIGH (ref 11.5–15.5)
WBC: 14.7 10*3/uL — ABNORMAL HIGH (ref 4.0–10.5)
nRBC: 0 % (ref 0.0–0.2)

## 2023-11-10 LAB — COMPREHENSIVE METABOLIC PANEL WITH GFR
ALT: 20 U/L (ref 0–44)
AST: 20 U/L (ref 15–41)
Albumin: 3.2 g/dL — ABNORMAL LOW (ref 3.5–5.0)
Alkaline Phosphatase: 49 U/L (ref 38–126)
Anion gap: 8 (ref 5–15)
BUN: 10 mg/dL (ref 6–20)
CO2: 22 mmol/L (ref 22–32)
Calcium: 9 mg/dL (ref 8.9–10.3)
Chloride: 108 mmol/L (ref 98–111)
Creatinine, Ser: 0.91 mg/dL (ref 0.44–1.00)
GFR, Estimated: >60 mL/min (ref >60)
Glucose, Bld: 111 mg/dL — ABNORMAL HIGH (ref 70–99)
Potassium: 3.7 mmol/L (ref 3.5–5.1)
Sodium: 138 mmol/L (ref 135–145)
Total Bilirubin: 0.2 mg/dL (ref 0.0–1.2)
Total Protein: 6.3 g/dL — ABNORMAL LOW (ref 6.5–8.1)

## 2023-11-10 LAB — HCG, SERUM, QUALITATIVE: Preg, Serum: NEGATIVE

## 2023-11-10 LAB — I-STAT CG4 LACTIC ACID, ED: Lactic Acid, Venous: 1.1 mmol/L (ref 0.5–1.9)

## 2023-11-10 NOTE — ED Notes (Signed)
 Pt requesting STD test while sitting in lobby. Triage RN made aware.

## 2023-11-10 NOTE — ED Triage Notes (Signed)
 Pt arrived from home via POV with sutured laceration to right hand. Pt was seen here on 11/06/2023 for laceration. Peri wound is now noted to be red, hot to touch, and edematous. 10/10 on pain scale.

## 2023-11-11 ENCOUNTER — Other Ambulatory Visit (HOSPITAL_COMMUNITY): Payer: Self-pay

## 2023-11-11 MED ORDER — DOXYCYCLINE HYCLATE 100 MG PO CAPS
100.0000 mg | ORAL_CAPSULE | Freq: Two times a day (BID) | ORAL | 0 refills | Status: DC
  Filled 2023-11-11: qty 10, 5d supply, fill #0

## 2023-11-11 MED ORDER — DOXYCYCLINE HYCLATE 100 MG PO TABS
100.0000 mg | ORAL_TABLET | Freq: Once | ORAL | Status: AC
  Administered 2023-11-11: 100 mg via ORAL
  Filled 2023-11-11: qty 1

## 2023-11-11 MED ORDER — OXYCODONE-ACETAMINOPHEN 5-325 MG PO TABS
1.0000 | ORAL_TABLET | Freq: Once | ORAL | Status: AC
  Administered 2023-11-11: 1 via ORAL
  Filled 2023-11-11: qty 1

## 2023-11-11 NOTE — ED Provider Notes (Signed)
 [place] EMERGENCY DEPARTMENT AT [facility] Provider Note   CSN: [id number] Arrival date & time: 11/10/23  1844     History Chief Complaint  Patient presents with   Wound Infection    Jenna [name] is a 28 y.o. female.  Patient has emergency department today with concerns of wound infection.  She reports that she had a laceration repair performed on 11/06/2023 after sustaining a laceration to the dorsum of the right thumb using her own pocket knife.  She reports that she was digging around in the soil when she sustained a laceration.  The wound was cleaned, sutured, and dressed prior to patient discharge.  She states that she began to notice some swelling, redness, and some purulent drainage starting yesterday.  Patient was not sent home on any antibiotics at the initial time of laceration repair.  HPI     Home Medications Prior to Admission medications   Medication Sig Start Date End Date Taking? Authorizing Provider  doxycycline  (VIBRAMYCIN ) 100 MG capsule Take 1 capsule (100 mg total) by mouth 2 (two) times daily for 5 days. 11/11/23 11/16/23 Yes Taetum Flewellen A, PA-C  gabapentin  (NEURONTIN ) 300 MG capsule Take 1 capsule (300 mg total) by mouth 3 (three) times daily. 09/06/23   Augusta Blizzard, MD  hydrOXYzine  (ATARAX ) 50 MG tablet Take 1 tablet (50 mg total) by mouth 3 (three) times daily as needed for anxiety. 09/06/23   Augusta Blizzard, MD  mirtazapine  (REMERON ) 15 MG tablet Take 1 tablet (15 mg total) by mouth at bedtime. 09/06/23   Augusta Blizzard, MD  nicotine  (NICODERM CQ  - DOSED IN MG/24 HOURS) 21 mg/24hr patch Place 1 patch (21 mg total) onto the skin daily at 6 (six) AM. 09/07/23   Augusta Blizzard, MD  QUEtiapine  (SEROQUEL ) 50 MG tablet Take 1 tablet (50 mg total) by mouth 2 (two) times daily. 09/06/23   Augusta Blizzard, MD      Allergies    Cyclobenzaprine  and Haldol  [haloperidol ]    Review of Systems   Review of Systems  Skin:  Positive for wound.  All other systems reviewed and are  negative.   Physical Exam Updated Vital Signs BP 116/76 (BP Location: Left Arm)   Pulse 80   Temp 98.4 F (36.9 C)   Resp 16   Ht 5\' 5"  (1.651 m)   Wt 78.9 kg   LMP 09/30/2023 (Approximate)   SpO2 100%   BMI 28.95 kg/m  Physical Exam Vitals and nursing note reviewed.  Constitutional:      General: She is not in acute distress.    Appearance: She is well-developed.  HENT:     Head: Normocephalic and atraumatic.  Eyes:     Conjunctiva/sclera: Conjunctivae normal.  Cardiovascular:     Rate and Rhythm: Normal rate and regular rhythm.     Heart sounds: No murmur heard. Pulmonary:     Effort: Pulmonary effort is normal. No respiratory distress.     Breath sounds: Normal breath sounds.  Abdominal:     Palpations: Abdomen is soft.     Tenderness: There is no abdominal tenderness.  Musculoskeletal:        General: No swelling.     Cervical back: Neck supple.  Skin:    General: Skin is warm and dry.     Capillary Refill: Capillary refill takes less than 2 seconds.     Findings: Erythema present.     Comments: Erythematous skin and warmth with tenderness to touch overlying the  previously repaired laceration site at the dorsum of the right thumb.  3 suture knots counted.  Neurological:     Mental Status: She is alert.  Psychiatric:        Mood and Affect: Mood normal.     ED Results / Procedures / Treatments   Labs (all labs ordered are listed, but only abnormal results are displayed) Labs Reviewed  COMPREHENSIVE METABOLIC PANEL WITH GFR - Abnormal; Notable for the following components:      Result Value   Glucose, Bld 111 (*)    Total Protein 6.3 (*)    Albumin 3.2 (*)    All other components within normal limits  CBC WITH DIFFERENTIAL/PLATELET - Abnormal; Notable for the following components:   WBC 14.7 (*)    Hemoglobin 10.9 (*)    HCT 33.8 (*)    RDW 19.2 (*)    Neutro Abs 11.9 (*)    Abs Immature Granulocytes 0.09 (*)    All other components within normal  limits  HCG, SERUM, QUALITATIVE  I-STAT CG4 LACTIC ACID, ED  I-STAT CG4 LACTIC ACID, ED    EKG None  Radiology No results found.  Procedures Procedures    Medications Ordered in ED Medications  doxycycline  (VIBRA -TABS) tablet 100 mg (100 mg Oral Given 11/11/23 0525)  oxyCODONE -acetaminophen  (PERCOCET/ROXICET) 5-325 MG per tablet 1 tablet (1 tablet Oral Given 11/11/23 0525)    ED Course/ Medical Decision Making/ A&P                                 Medical Decision Making Amount and/or Complexity of Data Reviewed Labs: ordered.  Risk Prescription drug management.   This patient presents to the ED for concern of wound infection.  Differential diagnosis includes cellulitis, abscess, wound dehiscence, DVT   Lab Tests:  I Ordered, and personally interpreted labs.  The pertinent results include: CBC with leukocytosis of 14.7, CMP unremarkable, hCG negative, lactic acid normal at 1.1   Medicines ordered and prescription drug management:  I ordered medication including Percocet, doxycycline  for pain, infection Reevaluation of the patient after these medicines showed that the patient improved I have reviewed the patients home medicines and have made adjustments as needed   Problem List / ED Course:  Patient presents to the emergency department today with concerns of a wound infection.  She reports that she had a lesion close a few days ago after accidentally cut herself with a pocket knife while digging on the dirt.  She states that she was also on antibiotics.  Denies any concerns about the wound opening up.  States that yesterday she began to notice some increasing pain, erythema, and swelling to the area.  Noted a small amount of purulent drainage starting earlier today.  Denies any recent fever, chills or bodyaches. Physical exam does reveal an area of erythema with some slight purulent drainage present from the repaired laceration site.  No significant induration.   Patient able to freely move the thumb with only notable pain at abduction and abduction of the thumb.  Neurovascularly intact. Labs obtained from triage does show a leukocytosis at 14.7.  Most recent labs show consistent leukocytosis although unclear if this is infectious versus other cause.  Lactic acid is normal.  CMP unremarkable.  In the setting of a rather unremarkable lab workup, will start patient on a course of antibiotics. Initial dose given here in the ED. Doubt septic joint given  area of laceration and instead suspect this is staph related cellulitis from laceration. Patient will plan on outpatient follow with strict return precautions advised. Discharged home in stable condition.   Final Clinical Impression(s) / ED Diagnoses Final diagnoses:  Laceration of right hand without foreign body, subsequent encounter  Cellulitis of finger of right hand    Rx / DC Orders ED Discharge Orders          Ordered    doxycycline  (VIBRAMYCIN ) 100 MG capsule  2 times daily        11/11/23 0519              Lenae Wherley A, PA-C 11/11/23 1610    Rory Collard, MD 11/12/23 [phone]

## 2023-11-11 NOTE — Discharge Instructions (Signed)
 You were seen in the ER today for concerns of a wound infection. The area was likely infected from your laceration a few days ago but has been slowly worsening. I have started you on a course of antibiotics to help treat this infection. Please take this as prescribed. For any concerns of worsening symptoms, return to the ER.

## 2023-11-12 ENCOUNTER — Emergency Department (HOSPITAL_COMMUNITY)
Admission: EM | Admit: 2023-11-12 | Discharge: 2023-11-12 | Disposition: A | Discharge status: Home / Self Care | Payer: MEDICAID | Attending: Emergency Medicine | Admitting: Emergency Medicine

## 2023-11-12 ENCOUNTER — Encounter (HOSPITAL_COMMUNITY): Payer: Self-pay | Admitting: *Deleted

## 2023-11-12 ENCOUNTER — Other Ambulatory Visit: Payer: Self-pay

## 2023-11-12 DIAGNOSIS — Z5900 Homelessness unspecified: Secondary | ICD-10-CM | POA: Insufficient documentation

## 2023-11-12 DIAGNOSIS — T7421XA Adult sexual abuse, confirmed, initial encounter: Secondary | ICD-10-CM | POA: Diagnosis present

## 2023-11-12 DIAGNOSIS — W260XXA Contact with knife, initial encounter: Secondary | ICD-10-CM | POA: Diagnosis not present

## 2023-11-12 DIAGNOSIS — S61011A Laceration without foreign body of right thumb without damage to nail, initial encounter: Secondary | ICD-10-CM | POA: Insufficient documentation

## 2023-11-12 LAB — BASIC METABOLIC PANEL WITH GFR
Anion gap: 8 (ref 5–15)
BUN: 11 mg/dL (ref 6–20)
CO2: 22 mmol/L (ref 22–32)
Calcium: 8.9 mg/dL (ref 8.9–10.3)
Chloride: 108 mmol/L (ref 98–111)
Creatinine, Ser: 0.81 mg/dL (ref 0.44–1.00)
GFR, Estimated: >60 mL/min (ref >60)
Glucose, Bld: 101 mg/dL — ABNORMAL HIGH (ref 70–99)
Potassium: 4.6 mmol/L (ref 3.5–5.1)
Sodium: 138 mmol/L (ref 135–145)

## 2023-11-12 LAB — CBC WITH DIFFERENTIAL/PLATELET
Abs Immature Granulocytes: 0.03 10*3/uL (ref 0.00–0.07)
Basophils Absolute: 0 10*3/uL (ref 0.0–0.1)
Basophils Relative: 1 %
Eosinophils Absolute: 0.2 10*3/uL (ref 0.0–0.5)
Eosinophils Relative: 3 %
HCT: 33.2 % — ABNORMAL LOW (ref 36.0–46.0)
Hemoglobin: 10.5 g/dL — ABNORMAL LOW (ref 12.0–15.0)
Immature Granulocytes: 0 %
Lymphocytes Relative: 26 %
Lymphs Abs: 2.2 10*3/uL (ref 0.7–4.0)
MCH: 26.9 pg (ref 26.0–34.0)
MCHC: 31.6 g/dL (ref 30.0–36.0)
MCV: 85.1 fL (ref 80.0–100.0)
Monocytes Absolute: 0.7 10*3/uL (ref 0.1–1.0)
Monocytes Relative: 8 %
Neutro Abs: 5.2 10*3/uL (ref 1.7–7.7)
Neutrophils Relative %: 62 %
Platelets: 254 10*3/uL (ref 150–400)
RBC: 3.9 MIL/uL (ref 3.87–5.11)
RDW: 19.3 % — ABNORMAL HIGH (ref 11.5–15.5)
WBC: 8.4 10*3/uL (ref 4.0–10.5)
nRBC: 0 % (ref 0.0–0.2)

## 2023-11-12 MED ORDER — ULIPRISTAL ACETATE 30 MG PO TABS
30.0000 mg | ORAL_TABLET | Freq: Once | ORAL | Status: AC
  Administered 2023-11-12: 30 mg via ORAL
  Filled 2023-11-12: qty 1

## 2023-11-12 MED ORDER — LIDOCAINE HCL (PF) 1 % IJ SOLN
1.0000 mL | Freq: Once | INTRAMUSCULAR | Status: DC
Start: 2023-11-12 — End: 2023-11-12

## 2023-11-12 MED ORDER — CEFTRIAXONE SODIUM 500 MG IJ SOLR
500.0000 mg | Freq: Once | INTRAMUSCULAR | Status: AC
  Administered 2023-11-12: 500 mg via INTRAMUSCULAR
  Filled 2023-11-12: qty 500

## 2023-11-12 MED ORDER — DOXYCYCLINE HYCLATE 100 MG PO TABS
100.0000 mg | ORAL_TABLET | Freq: Once | ORAL | Status: AC
  Administered 2023-11-12: 100 mg via ORAL
  Filled 2023-11-12: qty 1

## 2023-11-12 MED ORDER — LIDOCAINE HCL (PF) 1 % IJ SOLN
2.0000 mL | Freq: Once | INTRAMUSCULAR | Status: AC
  Administered 2023-11-12: 2 mL

## 2023-11-12 MED ORDER — AZITHROMYCIN 250 MG PO TABS
1000.0000 mg | ORAL_TABLET | Freq: Once | ORAL | Status: AC
  Administered 2023-11-12: 1000 mg via ORAL
  Filled 2023-11-12: qty 4

## 2023-11-12 MED ORDER — DOXYCYCLINE HYCLATE 100 MG PO CAPS: 100.0000 mg | ORAL_CAPSULE | Freq: Two times a day (BID) | ORAL | 0 refills | Status: AC

## 2023-11-12 MED ORDER — LIDOCAINE HCL 2 % IJ SOLN
INTRAMUSCULAR | Status: AC
  Administered 2023-11-12: 400 mg
  Filled 2023-11-12: qty 20

## 2023-11-12 MED ORDER — METRONIDAZOLE 500 MG PO TABS
2000.0000 mg | ORAL_TABLET | Freq: Once | ORAL | Status: AC
  Administered 2023-11-12: 2000 mg via ORAL
  Filled 2023-11-12: qty 4

## 2023-11-12 MED ORDER — ACETAMINOPHEN 500 MG PO TABS
1000.0000 mg | ORAL_TABLET | ORAL | Status: AC
  Administered 2023-11-12: 1000 mg via ORAL
  Filled 2023-11-12: qty 2

## 2023-11-12 NOTE — ED Notes (Signed)
 The pt is homeless
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
## 2023-11-12 NOTE — ED Notes (Signed)
 Spoke with [facility]. They state they will be out to see the patient in the next hour or so. Please call and let them know if the patient leaves.
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
## 2023-11-12 NOTE — ED Notes (Signed)
Pt given snack bag.  
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
## 2023-11-12 NOTE — ED Notes (Signed)
 Pt requesting to sit in the lobby. Doesn't want to sit In triage to wait for eval and sane. Instructed pt to stay in the lobby if she chooses to be evaluated
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
## 2023-11-13 ENCOUNTER — Other Ambulatory Visit (HOSPITAL_COMMUNITY): Payer: Self-pay

## 2023-11-20 ENCOUNTER — Other Ambulatory Visit (HOSPITAL_COMMUNITY): Payer: Self-pay

## 2023-12-04 ENCOUNTER — Encounter (HOSPITAL_COMMUNITY): Payer: Self-pay

## 2023-12-04 ENCOUNTER — Telehealth (HOSPITAL_COMMUNITY): Payer: Self-pay | Admitting: Licensed Clinical Social Worker

## 2023-12-04 ENCOUNTER — Ambulatory Visit (HOSPITAL_COMMUNITY): Payer: MEDICAID | Admitting: Licensed Clinical Social Worker

## 2023-12-04 NOTE — Telephone Encounter (Signed)
 LCSW sent two links to pt phone listed in EPIC for virtual 0900 appointment. LCSW called pt at 0912 as pt did not enter links. LCSW left HIPAA compliant VM for pt. LCSW waited until 0920 before disconnecting.

## 2023-12-08 ENCOUNTER — Other Ambulatory Visit (HOSPITAL_COMMUNITY): Payer: Self-pay

## 2023-12-08 ENCOUNTER — Encounter: Payer: Self-pay | Admitting: Physician Assistant

## 2023-12-08 ENCOUNTER — Ambulatory Visit: Payer: MEDICAID | Admitting: Physician Assistant

## 2023-12-08 VITALS — BP 99/66 | HR 101 | Ht 65.0 in | Wt 167.0 lb

## 2023-12-08 DIAGNOSIS — Z3202 Encounter for pregnancy test, result negative: Secondary | ICD-10-CM | POA: Diagnosis not present

## 2023-12-08 DIAGNOSIS — B350 Tinea barbae and tinea capitis: Secondary | ICD-10-CM

## 2023-12-08 DIAGNOSIS — A599 Trichomoniasis, unspecified: Secondary | ICD-10-CM | POA: Diagnosis not present

## 2023-12-08 DIAGNOSIS — D649 Anemia, unspecified: Secondary | ICD-10-CM

## 2023-12-08 DIAGNOSIS — R5383 Other fatigue: Secondary | ICD-10-CM

## 2023-12-08 DIAGNOSIS — Z114 Encounter for screening for human immunodeficiency virus [HIV]: Secondary | ICD-10-CM | POA: Diagnosis not present

## 2023-12-08 DIAGNOSIS — Z113 Encounter for screening for infections with a predominantly sexual mode of transmission: Secondary | ICD-10-CM | POA: Diagnosis not present

## 2023-12-08 DIAGNOSIS — N912 Amenorrhea, unspecified: Secondary | ICD-10-CM

## 2023-12-08 LAB — POCT URINE PREGNANCY: Preg Test, Ur: NEGATIVE

## 2023-12-08 MED ORDER — KETOCONAZOLE 2 % EX SHAM
1.0000 | MEDICATED_SHAMPOO | CUTANEOUS | 0 refills | Status: DC
Start: 2023-12-08 — End: 2023-12-29
  Filled 2023-12-08: qty 120, 28d supply, fill #0

## 2023-12-08 MED ORDER — METRONIDAZOLE 500 MG PO TABS
500.0000 mg | ORAL_TABLET | Freq: Two times a day (BID) | ORAL | 0 refills | Status: AC
  Filled 2023-12-08: qty 14, 7d supply, fill #0

## 2023-12-08 MED ORDER — KETOCONAZOLE 2 % EX CREA
1.0000 | TOPICAL_CREAM | Freq: Every day | CUTANEOUS | 0 refills | Status: DC
  Filled 2023-12-08: qty 15, 15d supply, fill #0
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
## 2023-12-08 NOTE — Patient Instructions (Signed)
 VISIT SUMMARY:  Today, you came in with concerns about possible pregnancy, hair loss, and fatigue. You mentioned experiencing significant hair loss and fatigue for the past two weeks, along with itchy sores on your scalp. Your menstrual cycle has been irregular, and you have not had a period since before April. A pregnancy test conducted a month ago was negative. You also expressed interest in STD screening and mentioned not taking medication for a trichomonas infection. You have a history of anemia, but recent iron  levels were normal, and your thyroid  function was normal in March.  YOUR PLAN:  your itchy scalp sores could be due to a skin condition or an allergic reaction. We have prescribed a topical cream for daily application on your scalp and a shampoo to be used twice a week.  -FATIGUE: Fatigue is a feeling of extreme tiredness. Since it coincides with your hair loss, we need to re-evaluate your thyroid  function and check your metabolic panel. We will also refer you to gynecology for further evaluation of your anemia.  -ANEMIA: Anemia is a condition where you don't have enough healthy red blood cells to carry adequate oxygen to your body's tissues.   -TRICHOMONIASIS: Trichomoniasis is a sexually transmitted infection. Since you have not taken the prescribed medication, we will retest and provide you with the necessary medication. We will also perform an STD screening and a vaginal swab for retesting.  -POSSIBLE PREGNANCY: Since you have not had a period since before April and had a negative pregnancy test a month ago, we will perform another pregnancy test to confirm your status.

## 2023-12-09 ENCOUNTER — Telehealth: Payer: Self-pay

## 2023-12-09 ENCOUNTER — Other Ambulatory Visit (HOSPITAL_COMMUNITY)
Admission: RE | Admit: 2023-12-09 | Discharge: 2023-12-09 | Disposition: A | Discharge status: Home / Self Care | Payer: MEDICAID | Source: Ambulatory Visit | Attending: Physician Assistant | Admitting: Physician Assistant

## 2023-12-09 DIAGNOSIS — A599 Trichomoniasis, unspecified: Secondary | ICD-10-CM | POA: Insufficient documentation

## 2023-12-09 DIAGNOSIS — N760 Acute vaginitis: Secondary | ICD-10-CM | POA: Insufficient documentation

## 2023-12-09 DIAGNOSIS — B9689 Other specified bacterial agents as the cause of diseases classified elsewhere: Secondary | ICD-10-CM | POA: Insufficient documentation

## 2023-12-09 NOTE — Telephone Encounter (Signed)
 Specimen sent back from cytology with note stating it needed to be "collected". Unsure what that meant or what was wrong order so I put in orders again ( swab was collected properly and in bag)

## 2023-12-10 ENCOUNTER — Ambulatory Visit: Payer: Self-pay | Admitting: Physician Assistant

## 2023-12-10 LAB — COMP. METABOLIC PANEL (12)
AST: 29 IU/L (ref 0–40)
Albumin: 4.3 g/dL (ref 4.0–5.0)
Alkaline Phosphatase: 65 IU/L (ref 44–121)
BUN/Creatinine Ratio: 30 — ABNORMAL HIGH (ref 9–23)
BUN: 18 mg/dL (ref 6–20)
Bilirubin Total: <0.2 mg/dL (ref 0.0–1.2)
Calcium: 9.2 mg/dL (ref 8.7–10.2)
Chloride: 107 mmol/L — ABNORMAL HIGH (ref 96–106)
Creatinine, Ser: 0.6 mg/dL (ref 0.57–1.00)
Globulin, Total: 2.9 g/dL (ref 1.5–4.5)
Glucose: 66 mg/dL — ABNORMAL LOW (ref 70–99)
Potassium: 4.4 mmol/L (ref 3.5–5.2)
Sodium: 144 mmol/L (ref 134–144)
Total Protein: 7.2 g/dL (ref 6.0–8.5)
eGFR: 126 mL/min/{1.73_m2} (ref >59)

## 2023-12-10 LAB — CERVICOVAGINAL ANCILLARY ONLY
Bacterial Vaginitis (gardnerella): POSITIVE — AB
Candida Glabrata: NEGATIVE
Candida Vaginitis: NEGATIVE
Chlamydia: NEGATIVE
Comment: NEGATIVE
Comment: NEGATIVE
Comment: NEGATIVE
Comment: NEGATIVE
Comment: NEGATIVE
Comment: NORMAL
Neisseria Gonorrhea: NEGATIVE
Trichomonas: NEGATIVE

## 2023-12-10 LAB — CBC WITH DIFFERENTIAL/PLATELET
Basophils Absolute: 0.1 10*3/uL (ref 0.0–0.2)
Basos: 1 %
EOS (ABSOLUTE): 0.2 10*3/uL (ref 0.0–0.4)
Eos: 3 %
Hematocrit: 42 % (ref 34.0–46.6)
Hemoglobin: 12.5 g/dL (ref 11.1–15.9)
Immature Grans (Abs): 0 10*3/uL (ref 0.0–0.1)
Immature Granulocytes: 0 %
Lymphocytes Absolute: 2.5 10*3/uL (ref 0.7–3.1)
Lymphs: 32 %
MCH: 25.9 pg — ABNORMAL LOW (ref 26.6–33.0)
MCHC: 29.8 g/dL — ABNORMAL LOW (ref 31.5–35.7)
MCV: 87 fL (ref 79–97)
Monocytes Absolute: 0.6 10*3/uL (ref 0.1–0.9)
Monocytes: 8 %
Neutrophils Absolute: 4.2 10*3/uL (ref 1.4–7.0)
Neutrophils: 56 %
Platelets: 277 10*3/uL (ref 150–450)
RBC: 4.82 x10E6/uL (ref 3.77–5.28)
RDW: 18 % — ABNORMAL HIGH (ref 11.7–15.4)
WBC: 7.6 10*3/uL (ref 3.4–10.8)

## 2023-12-10 LAB — RPR: RPR Ser Ql: NONREACTIVE

## 2023-12-10 LAB — IRON,TIBC AND FERRITIN PANEL
Ferritin: 16 ng/mL (ref 15–150)
Iron Saturation: 24 % (ref 15–55)
Iron: 97 ug/dL (ref 27–159)
Total Iron Binding Capacity: 409 ug/dL (ref 250–450)
UIBC: 312 ug/dL (ref 131–425)

## 2023-12-10 LAB — TSH: TSH: 0.809 u[IU]/mL (ref 0.450–4.500)

## 2023-12-10 LAB — HIV ANTIBODY (ROUTINE TESTING W REFLEX): HIV Screen 4th Generation wRfx: NONREACTIVE

## 2023-12-10 LAB — VITAMIN D 25 HYDROXY (VIT D DEFICIENCY, FRACTURES): Vit D, 25-Hydroxy: 36.2 ng/mL (ref 30.0–100.0)

## 2023-12-18 ENCOUNTER — Other Ambulatory Visit (HOSPITAL_COMMUNITY): Payer: Self-pay

## 2023-12-28 ENCOUNTER — Other Ambulatory Visit: Payer: Self-pay

## 2023-12-28 ENCOUNTER — Encounter (HOSPITAL_COMMUNITY): Payer: Self-pay | Admitting: Emergency Medicine

## 2023-12-28 ENCOUNTER — Emergency Department (HOSPITAL_COMMUNITY)
Admission: EM | Admit: 2023-12-28 | Discharge: 2023-12-29 | Disposition: A | Discharge status: Other Acute Inpatient Hospital | Payer: MEDICAID | Attending: Emergency Medicine | Admitting: Emergency Medicine

## 2023-12-28 ENCOUNTER — Emergency Department (HOSPITAL_COMMUNITY): Payer: MEDICAID

## 2023-12-28 DIAGNOSIS — F1590 Other stimulant use, unspecified, uncomplicated: Secondary | ICD-10-CM | POA: Diagnosis not present

## 2023-12-28 DIAGNOSIS — F32A Depression, unspecified: Secondary | ICD-10-CM | POA: Diagnosis not present

## 2023-12-28 DIAGNOSIS — R45851 Suicidal ideations: Secondary | ICD-10-CM | POA: Diagnosis not present

## 2023-12-28 DIAGNOSIS — M25551 Pain in right hip: Secondary | ICD-10-CM | POA: Insufficient documentation

## 2023-12-28 DIAGNOSIS — F17210 Nicotine dependence, cigarettes, uncomplicated: Secondary | ICD-10-CM | POA: Insufficient documentation

## 2023-12-28 DIAGNOSIS — F1520 Other stimulant dependence, uncomplicated: Secondary | ICD-10-CM | POA: Diagnosis present

## 2023-12-28 LAB — CBC WITH DIFFERENTIAL/PLATELET
Abs Immature Granulocytes: 0.03 10*3/uL (ref 0.00–0.07)
Basophils Absolute: 0.1 10*3/uL (ref 0.0–0.1)
Basophils Relative: 1 %
Eosinophils Absolute: 0.2 10*3/uL (ref 0.0–0.5)
Eosinophils Relative: 3 %
HCT: 33.1 % — ABNORMAL LOW (ref 36.0–46.0)
Hemoglobin: 10.3 g/dL — ABNORMAL LOW (ref 12.0–15.0)
Immature Granulocytes: 0 %
Lymphocytes Relative: 31 %
Lymphs Abs: 2.3 10*3/uL (ref 0.7–4.0)
MCH: 27.2 pg (ref 26.0–34.0)
MCHC: 31.1 g/dL (ref 30.0–36.0)
MCV: 87.6 fL (ref 80.0–100.0)
Monocytes Absolute: 0.6 10*3/uL (ref 0.1–1.0)
Monocytes Relative: 8 %
Neutro Abs: 4.2 10*3/uL (ref 1.7–7.7)
Neutrophils Relative %: 57 %
Platelets: 203 10*3/uL (ref 150–400)
RBC: 3.78 MIL/uL — ABNORMAL LOW (ref 3.87–5.11)
RDW: 19.6 % — ABNORMAL HIGH (ref 11.5–15.5)
WBC: 7.3 10*3/uL (ref 4.0–10.5)
nRBC: 0 % (ref 0.0–0.2)

## 2023-12-28 LAB — COMPREHENSIVE METABOLIC PANEL WITH GFR
ALT: 13 U/L (ref 0–44)
AST: 17 U/L (ref 15–41)
Albumin: 3.1 g/dL — ABNORMAL LOW (ref 3.5–5.0)
Alkaline Phosphatase: 50 U/L (ref 38–126)
Anion gap: 6 (ref 5–15)
BUN: 18 mg/dL (ref 6–20)
CO2: 28 mmol/L (ref 22–32)
Calcium: 8.6 mg/dL — ABNORMAL LOW (ref 8.9–10.3)
Chloride: 105 mmol/L (ref 98–111)
Creatinine, Ser: 0.98 mg/dL (ref 0.44–1.00)
GFR, Estimated: >60 mL/min (ref >60)
Glucose, Bld: 85 mg/dL (ref 70–99)
Potassium: 4.1 mmol/L (ref 3.5–5.1)
Sodium: 139 mmol/L (ref 135–145)
Total Bilirubin: 0.2 mg/dL (ref 0.0–1.2)
Total Protein: 5.8 g/dL — ABNORMAL LOW (ref 6.5–8.1)

## 2023-12-28 LAB — HCG, SERUM, QUALITATIVE: Preg, Serum: NEGATIVE

## 2023-12-28 LAB — ETHANOL: Alcohol, Ethyl (B): <15 mg/dL (ref <15)

## 2023-12-28 MED ORDER — KETOROLAC TROMETHAMINE 15 MG/ML IJ SOLN
15.0000 mg | Freq: Once | INTRAMUSCULAR | Status: AC
  Administered 2023-12-28: 15 mg via INTRAMUSCULAR
  Filled 2023-12-28: qty 1

## 2023-12-28 MED ORDER — ACETAMINOPHEN 500 MG PO TABS
1000.0000 mg | ORAL_TABLET | Freq: Once | ORAL | Status: AC
  Administered 2023-12-28: 1000 mg via ORAL
  Filled 2023-12-28: qty 2

## 2023-12-28 MED ORDER — LORAZEPAM 1 MG PO TABS: 1.0000 mg | ORAL_TABLET | Freq: Four times a day (QID) | ORAL | Status: DC | PRN

## 2023-12-28 MED ORDER — BUPROPION HCL ER (XL) 150 MG PO TB24
150.0000 mg | ORAL_TABLET | Freq: Every day | ORAL | Status: DC
  Filled 2023-12-28: qty 1

## 2023-12-28 NOTE — ED Notes (Signed)
Pt. Unable to give urine at this time. 

## 2023-12-28 NOTE — ED Provider Notes (Signed)
  Physical Exam  BP (!) 94/52 (BP Location: Left Arm)   Pulse 81   Temp 98.7 F (37.1 C) (Oral)   Resp 16   Ht 5' 5 (1.651 m)   Wt 75.8 kg   LMP 11/30/2023 (Within Weeks)   SpO2 100%   BMI 27.79 kg/m   Physical Exam Vitals and nursing note reviewed.  Constitutional:      General: She is not in acute distress.    Appearance: She is well-developed.  HENT:     Head: Normocephalic and atraumatic.   Eyes:     Conjunctiva/sclera: Conjunctivae normal.    Cardiovascular:     Rate and Rhythm: Normal rate and regular rhythm.     Heart sounds: No murmur heard. Pulmonary:     Effort: Pulmonary effort is normal. No respiratory distress.     Breath sounds: Normal breath sounds.  Abdominal:     Palpations: Abdomen is soft.     Tenderness: There is no abdominal tenderness.   Musculoskeletal:        General: Tenderness present. No swelling.     Cervical back: Neck supple.   Skin:    General: Skin is warm and dry.     Capillary Refill: Capillary refill takes less than 2 seconds.   Neurological:     Mental Status: She is alert.   Psychiatric:        Mood and Affect: Mood normal.     Procedures  Procedures  ED Course / MDM    Medical Decision Making Amount and/or Complexity of Data Reviewed Labs: ordered. Radiology: ordered.  Risk OTC drugs. Prescription drug management.   Patient received in handoff.  Suicidal ideation and meth use last night.  Had some complaints of right hip pain.  X-ray reassuringly negative.  Laboratory valuation unremarkable.  Patient medically for psychiatric evaluation.  TTS evaluated the patient is recommending inpatient hospitalization.  Pending placement       Albertina Dixon, MD 12/28/23 [phone]

## 2023-12-28 NOTE — ED Triage Notes (Signed)
 Pt. [facility]. Per EMS pt. Admitted to using meth last night at 10 pm. Pt. A&O x 4.   Per EMS: BP: 116/74 HR: 100 SPO2: 94% RA CBG:105

## 2023-12-28 NOTE — ED Notes (Signed)
 Pt. Belongings put in cabinet [place] B 9-12. Pt. Wanded by security.
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
## 2023-12-28 NOTE — ED Notes (Signed)
 Personal items moved to locker 37
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
## 2023-12-28 NOTE — Progress Notes (Signed)
 [facility] LCSW Progress Note   12/28/2023    5:27 PM  Jenna [name]   [id number]   Type of Contact and Topic:  Psychiatric Bed Placement   Pt accepted to [facility] 304-1    [name] meets inpatient criteria per Jenna Patient, NP   The attending provider will be Jenna [name]  Call report to [phone]    Jenna Lesches, RN @ [facility] ED notified.     Pt scheduled  to arrive at [facility] TODAY.    Jenna [name], MSW, LCSW-A  5:28 PM 12/28/2023

## 2023-12-29 ENCOUNTER — Encounter (HOSPITAL_COMMUNITY): Payer: Self-pay

## 2023-12-29 ENCOUNTER — Inpatient Hospital Stay (HOSPITAL_COMMUNITY)
Admission: AD | Admit: 2023-12-29 | Discharge: 2023-12-31 | DRG: 897 | Disposition: A | Discharge status: Hospice | Payer: MEDICAID | Source: Intra-hospital

## 2023-12-29 DIAGNOSIS — F1520 Other stimulant dependence, uncomplicated: Principal | ICD-10-CM

## 2023-12-29 DIAGNOSIS — Z888 Allergy status to other drugs, medicaments and biological substances status: Secondary | ICD-10-CM | POA: Diagnosis not present

## 2023-12-29 DIAGNOSIS — N760 Acute vaginitis: Secondary | ICD-10-CM | POA: Diagnosis present

## 2023-12-29 DIAGNOSIS — Z5900 Homelessness unspecified: Secondary | ICD-10-CM | POA: Diagnosis not present

## 2023-12-29 DIAGNOSIS — Z83438 Family history of other disorder of lipoprotein metabolism and other lipidemia: Secondary | ICD-10-CM | POA: Diagnosis not present

## 2023-12-29 DIAGNOSIS — F15251 Other stimulant dependence with stimulant-induced psychotic disorder with hallucinations: Principal | ICD-10-CM | POA: Diagnosis present

## 2023-12-29 DIAGNOSIS — F39 Unspecified mood [affective] disorder: Secondary | ICD-10-CM

## 2023-12-29 DIAGNOSIS — Z608 Other problems related to social environment: Secondary | ICD-10-CM | POA: Diagnosis present

## 2023-12-29 DIAGNOSIS — F1290 Cannabis use, unspecified, uncomplicated: Secondary | ICD-10-CM | POA: Diagnosis present

## 2023-12-29 DIAGNOSIS — Z560 Unemployment, unspecified: Secondary | ICD-10-CM

## 2023-12-29 DIAGNOSIS — Z833 Family history of diabetes mellitus: Secondary | ICD-10-CM | POA: Diagnosis not present

## 2023-12-29 DIAGNOSIS — Z716 Tobacco abuse counseling: Secondary | ICD-10-CM | POA: Diagnosis not present

## 2023-12-29 DIAGNOSIS — R45851 Suicidal ideations: Secondary | ICD-10-CM

## 2023-12-29 DIAGNOSIS — F17210 Nicotine dependence, cigarettes, uncomplicated: Secondary | ICD-10-CM | POA: Diagnosis present

## 2023-12-29 DIAGNOSIS — Z91410 Personal history of adult physical and sexual abuse: Secondary | ICD-10-CM

## 2023-12-29 LAB — RAPID URINE DRUG SCREEN, HOSP PERFORMED
Amphetamines: POSITIVE — AB
Barbiturates: NOT DETECTED
Benzodiazepines: NOT DETECTED
Cocaine: NOT DETECTED
Opiates: NOT DETECTED
Tetrahydrocannabinol: POSITIVE — AB

## 2023-12-29 MED ORDER — OLANZAPINE 2.5 MG PO TABS: 2.5000 mg | ORAL_TABLET | ORAL | Status: DC | PRN

## 2023-12-29 MED ORDER — ENSURE PLUS HIGH PROTEIN PO LIQD
237.0000 mL | Freq: Two times a day (BID) | ORAL | Status: DC
  Filled 2023-12-29 (×5): qty 237

## 2023-12-29 MED ORDER — ACETAMINOPHEN 325 MG PO TABS
650.0000 mg | ORAL_TABLET | Freq: Four times a day (QID) | ORAL | Status: DC | PRN
  Administered 2023-12-30: 650 mg via ORAL
  Filled 2023-12-29: qty 2

## 2023-12-29 MED ORDER — OLANZAPINE 5 MG PO TBDP
5.0000 mg | ORAL_TABLET | Freq: Three times a day (TID) | ORAL | Status: DC | PRN
  Administered 2023-12-31: 5 mg via ORAL
  Filled 2023-12-29: qty 1

## 2023-12-29 MED ORDER — DIPHENHYDRAMINE HCL 25 MG PO CAPS
50.0000 mg | ORAL_CAPSULE | Freq: Once | ORAL | Status: AC
  Administered 2023-12-29: 50 mg via ORAL
  Filled 2023-12-29: qty 2

## 2023-12-29 MED ORDER — METRONIDAZOLE 250 MG PO TABS
500.0000 mg | ORAL_TABLET | Freq: Two times a day (BID) | ORAL | Status: DC
  Administered 2023-12-29 – 2023-12-31 (×4): 500 mg via ORAL
  Filled 2023-12-29 (×4): qty 2

## 2023-12-29 MED ORDER — QUETIAPINE FUMARATE 50 MG PO TABS
50.0000 mg | ORAL_TABLET | Freq: Once | ORAL | Status: AC
  Administered 2023-12-29: 50 mg via ORAL
  Filled 2023-12-29: qty 1

## 2023-12-29 MED ORDER — NICOTINE 14 MG/24HR TD PT24
14.0000 mg | MEDICATED_PATCH | Freq: Every day | TRANSDERMAL | Status: DC
  Administered 2023-12-29 – 2023-12-31 (×3): 14 mg via TRANSDERMAL
  Filled 2023-12-29 (×3): qty 1

## 2023-12-29 MED ORDER — OLANZAPINE 2.5 MG PO TABS
2.5000 mg | ORAL_TABLET | Freq: Every day | ORAL | Status: DC
  Administered 2023-12-29 – 2023-12-30 (×2): 2.5 mg via ORAL
  Filled 2023-12-29 (×2): qty 1

## 2023-12-29 MED ORDER — HYDROXYZINE HCL 50 MG PO TABS
50.0000 mg | ORAL_TABLET | Freq: Three times a day (TID) | ORAL | Status: DC | PRN
  Administered 2023-12-30 – 2023-12-31 (×3): 50 mg via ORAL
  Filled 2023-12-29 (×3): qty 1

## 2023-12-29 MED ORDER — OLANZAPINE 10 MG IM SOLR: 5.0000 mg | Freq: Three times a day (TID) | INTRAMUSCULAR | Status: DC | PRN

## 2023-12-29 MED ORDER — BUPROPION HCL ER (XL) 150 MG PO TB24
150.0000 mg | ORAL_TABLET | Freq: Every day | ORAL | Status: DC
  Administered 2023-12-29 – 2023-12-31 (×3): 150 mg via ORAL
  Filled 2023-12-29 (×4): qty 1

## 2023-12-29 MED ORDER — OLANZAPINE 10 MG IM SOLR: 10.0000 mg | Freq: Three times a day (TID) | INTRAMUSCULAR | Status: DC | PRN

## 2023-12-29 MED ORDER — MAGNESIUM HYDROXIDE 400 MG/5ML PO SUSP: 30.0000 mL | Freq: Every day | ORAL | Status: DC | PRN

## 2023-12-29 MED ORDER — ALUM & MAG HYDROXIDE-SIMETH 200-200-20 MG/5ML PO SUSP: 30.0000 mL | ORAL | Status: DC | PRN

## 2023-12-29 MED ORDER — LORAZEPAM 1 MG PO TABS
1.0000 mg | ORAL_TABLET | Freq: Four times a day (QID) | ORAL | Status: DC | PRN
  Administered 2023-12-30 – 2023-12-31 (×2): 1 mg via ORAL
  Filled 2023-12-29 (×3): qty 1

## 2023-12-29 NOTE — Tx Team (Signed)
 Initial Treatment Plan 12/29/2023 11:04 AM Myia Bergh Malkowski FMW:[id number]    PATIENT STRESSORS: Financial difficulties   Substance abuse     PATIENT STRENGTHS: General fund of knowledge    PATIENT IDENTIFIED PROBLEMS: MDD  Substance abuse                    DISCHARGE CRITERIA:  Motivation to continue treatment in a less acute level of care Need for constant or close observation no longer present Verbal commitment to aftercare and medication compliance Withdrawal symptoms are absent or subacute and managed without 24-hour nursing intervention  PRELIMINARY DISCHARGE PLAN: Outpatient therapy Placement in alternative living arrangements  PATIENT/FAMILY INVOLVEMENT: This treatment plan has been presented to and reviewed with the patient, Aleila Q Shill.  The patient and family have been given the opportunity to ask questions and make suggestions.  Sharisse Rantz M Caeley Dohrmann, RN 12/29/2023, 11:04 AM

## 2023-12-29 NOTE — ED Provider Notes (Signed)
 Emergency Medicine Observation Re-evaluation Note  Jenna [name] is a 28 y.o. female, seen on rounds today.  Pt initially presented to the ED for complaints of Suicidal Currently, the patient is laying in bed.  Physical Exam  BP 103/66 (BP Location: Right Arm)   Pulse 63   Temp 98.3 F (36.8 C) (Oral)   Resp 16   Ht 5' 5 (1.651 m)   Wt 75.8 kg   LMP 11/30/2023 (Within Weeks)   SpO2 100%   BMI 27.79 kg/m  Physical Exam General: No acute distress Cardiac: Normal rate Lungs: No increased work of breathing Psych: Calm  ED Course / MDM  EKG:EKG Interpretation Date/Time:  Sunday December 28 2023 13:32:41 EDT Ventricular Rate:  91 PR Interval:  144 QRS Duration:  78 QT Interval:  346 QTC Calculation: 425 R Axis:   76  Text Interpretation: Normal sinus rhythm Normal ECG When compared with ECG of 18-Sep-2023 17:59, PREVIOUS ECG IS PRESENT Confirmed by Mannie Pac [phone]) on 12/28/2023 2:07:50 PM  I have reviewed the labs performed to date as well as medications administered while in observation.  Recent changes in the last 24 hours include none.  Plan  Current plan is for admission to [facility].  Per notes, patient was refused yesterday because she has not been cooperative in giving  a urine sample.  UDS is pending.  This should not delay transfer to [facility].  RN to discuss with behavioral health staff during morning rounds.  Will advise if there is any further delay to her being admitted to [facility].    Lenor Hollering, MD 12/29/23 [phone]

## 2023-12-29 NOTE — BHH Suicide Risk Assessment (Signed)
 [facility] Admission Suicide Risk Assessment   Nursing information obtained from:  Patient Demographic factors:  Low socioeconomic status, Living alone, Unemployed Current Mental Status:  Suicidal ideation indicated by patient Loss Factors:  Financial problems / change in socioeconomic status Historical Factors:  Prior suicide attempts Risk Reduction Factors:  NA  Total Time spent with patient: 1 hour Principal Problem: Methamphetamine use disorder, severe (HCC) Diagnosis:  Principal Problem:   Methamphetamine use disorder, severe (HCC)  Subjective Data: See H&P  Continued Clinical Symptoms:  Alcohol Use Disorder Identification Test Final Score (AUDIT): 0 The Alcohol Use Disorders Identification Test, Guidelines for Use in Primary Care, Second Edition.  World Science writer [facility]). Score between 0-7:  no or low risk or alcohol related problems. Score between 8-15:  moderate risk of alcohol related problems. Score between 16-19:  high risk of alcohol related problems. Score 20 or above:  warrants further diagnostic evaluation for alcohol dependence and treatment.   CLINICAL FACTORS:  Active substance use disorder (methamphetamine)   Musculoskeletal: Normal gait and station   Assets  Assets: Communication Skills; Desire for Improvement    Physical Exam Vitals and nursing note reviewed.  Constitutional:      Appearance: Normal appearance. She is normal weight.  HENT:     Head: Normocephalic and atraumatic.   Eyes:     Extraocular Movements: Extraocular movements intact.   Pulmonary:     Effort: Pulmonary effort is normal.   Musculoskeletal:     Cervical back: Normal range of motion.   Neurological:     General: No focal deficit present.     Mental Status: She is alert and oriented to person, place, and time.    Review of Systems  Constitutional: Negative.   HENT: Negative.    Cardiovascular: Negative.   Neurological: Negative.    Blood pressure 113/66,  pulse 83, temperature 98.7 F (37.1 C), temperature source Oral, resp. rate 14, height 5' 5 (1.651 m), weight 74 kg, last menstrual period 11/30/2023, SpO2 100%. Body mass index is 27.16 kg/m.   COGNITIVE FEATURES THAT CONTRIBUTE TO RISK:  None    SUICIDE RISK:   Moderate:  Frequent suicidal ideation with limited intensity, and duration, some specificity in terms of plans, no associated intent, good self-control, limited dysphoria/symptomatology, some risk factors present, and identifiable protective factors, including available and accessible social support.  PLAN OF CARE: Admit to inpatient. See H&P  I certify that inpatient services furnished can reasonably be expected to improve the patient's condition.   Oliva DELENA Salmon, DO 12/29/2023, 5:03 PM

## 2023-12-29 NOTE — Progress Notes (Addendum)
   12/29/23 1030  Psych Admission Type (Psych Patients Only)  Admission Status Voluntary  Psychosocial Assessment  Patient Complaints Depression  Eye Contact Brief  Facial Expression Sad  Affect Depressed  Speech Logical/coherent  Interaction Assertive  Motor Activity Other (Comment) (WDL)  Appearance/Hygiene In scrubs  Behavior Characteristics Agitated  Mood Depressed  Thought Process  Coherency WDL  Content WDL  Delusions None reported or observed  Perception WDL  Hallucination Auditory;Visual  Judgment Poor  Confusion None  Danger to Self  Current suicidal ideation? Passive  Agreement Not to Harm Self Yes  Description of Agreement verbal  Danger to Others  Danger to Others None reported or observed   Pt presents agitated and depressed upon admission. Pt states she is here To detox off meth. Pt also endorses experiencing suicidal ideation on a daily basis, and reports suicide attempts daily. When asked the method of her last attempt, pt reports pills. Pt denies alcohol use but reports using meth and THC daily.

## 2023-12-29 NOTE — ED Notes (Signed)
 Pt up to restroom. Asked to give urine specimen. Pt did not give urine specimen.
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
## 2023-12-29 NOTE — Group Note (Signed)
 Date:  12/29/2023 Time:  9:18 PM  Group Topic/Focus:  Wrap-Up Group:   The focus of this group is to help patients review their daily goal of treatment and discuss progress on daily workbooks.    Participation Level:  Did Not Attend  Jalexa Pifer Dacosta 12/29/2023, 9:18 PM

## 2023-12-29 NOTE — Group Note (Signed)
 Recreation Therapy Group Note   Group Topic:Stress Management  Group Date: 12/29/2023 Start Time: 0930 End Time: 0950 Facilitators: Haiden Rawlinson-McCall, LRT,CTRS Location: [address] Dayroom   Group Topic: Stress Management  Goal Area(s) Addresses:  Patient will identify positive stress management techniques. Patient will identify benefits of using stress management post d/c.  Behavioral Response:   Intervention: Meditation App  Activity: LRT played a meditation that focused on morning mindfulness, self-compassion and clarity. The meditation helped get patients to focus on being content in their inner peace and confidence.  Education:  Stress Management, Discharge Planning.   Education Outcome: Acknowledges Education   Affect/Mood: N/A   Participation Level: Did not attend    Clinical Observations/Individualized Feedback:     Plan: Continue to engage patient in RT group sessions 2-3x/week.   Jenna [name], LRT,CTRS 12/29/2023 12:45 PM

## 2023-12-29 NOTE — BHH Group Notes (Signed)

## 2023-12-29 NOTE — ED Notes (Signed)
 Pt has refused to give urine specimen for UDS despite staff stressing need for it for admittance to [facility]. Pt was gviven urine cup to use when going to bathroom, however did not provide sample. When Pt exited restroom, staff asked if she had given sample but Pt ignored staff and walked back to her room.Specimen cup was sitting on sink empty in bathroom

## 2023-12-29 NOTE — ED Notes (Signed)
 Patient off unit to [facility] per provider. Patient alert, cooperative, no s/s of distress at time of discharge. Discharge information and belongings give to Tree surgeon for facility.  Patient ambulatory off unit, escorted by NT. Patient transported by [employer].

## 2023-12-30 LAB — HIV ANTIBODY (ROUTINE TESTING W REFLEX): HIV Screen 4th Generation wRfx: NONREACTIVE

## 2023-12-30 MED ORDER — WHITE PETROLATUM EX OINT
TOPICAL_OINTMENT | CUTANEOUS | Status: AC
  Filled 2023-12-30: qty 5
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
## 2023-12-30 NOTE — Group Note (Signed)
 Date:  12/30/2023 Time:  9:05 PM  Group Topic/Focus:  Wrap-Up Group:   The focus of this group is to help patients review their daily goal of treatment and discuss progress on daily workbooks.    Participation Level:  Active  Participation Quality:  Appropriate  Affect:  Appropriate  Cognitive:  Appropriate  Insight: Appropriate  Engagement in Group:  Engaged  Modes of Intervention:  Education and Exploration  Additional Comments:  Patient attended and participated in group tonight. She reports that the best thing that happened for her today was that God woke her up this morning and gave her another chance.  Jenna [name] 12/30/2023, 9:05 PM

## 2023-12-30 NOTE — Progress Notes (Signed)
 D: Pt alert and oriented. Pt rates depression 9/10 and anxiety 7/10.  Pt denies experiencing any SI/HI, or AVH at this time.   A: Scheduled medications administered to pt, per MD orders. Support and encouragement provided. Frequent verbal contact made. Routine safety checks conducted q15 minutes.   R: No adverse drug reactions noted. Pt verbally contracts for safety at this time. Pt complaint with medications and treatment plan. Pt interacts well with others on the unit. Pt remains safe at this time. Will continue to monitor.

## 2023-12-30 NOTE — Plan of Care (Signed)
   Problem: Education: Goal: Knowledge of [facility] Education information/materials will improve Outcome: Progressing Goal: Emotional status will improve Outcome: Progressing Goal: Mental status will improve Outcome: Progressing Goal: Verbalization of understanding the information provided will improve Outcome: Progressing

## 2023-12-30 NOTE — Group Note (Signed)
 Recreation Therapy Group Note   Group Topic:Communication  Group Date: 12/30/2023 Start Time: 1035 End Time: 1100 Facilitators: Latitia Housewright-McCall, LRT,CTRS Location: 500 Hall Dayroom   Group Topic: Communication  Goal Area(s) Addresses:  Patient will effectively communicate in group.  Patient will verbalize benefit of healthy communication. Patient will verbalize positive effect of healthy communication on post d/c goals.   Behavioral Response:   Intervention: Open Discussion  Activity: LRT and patients had an open discussion about various topics that were important to them. Patients addressed future plans, how to accomplish those plans and other things that interest them.   Education: Communication, Discharge Planning  Education Outcome: Acknowledges understanding/In group clarification offered/Needs additional education.    Affect/Mood: N/A   Participation Level: Did not attend    Clinical Observations/Individualized Feedback:     Plan: Continue to engage patient in RT group sessions 2-3x/week.   Ricci Dirocco-McCall, LRT,CTRS 12/30/2023 3:11 PM
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
## 2023-12-31 ENCOUNTER — Other Ambulatory Visit: Payer: Self-pay

## 2023-12-31 ENCOUNTER — Telehealth (HOSPITAL_COMMUNITY): Payer: Self-pay | Admitting: Pharmacy Technician

## 2023-12-31 ENCOUNTER — Other Ambulatory Visit (HOSPITAL_COMMUNITY): Payer: Self-pay

## 2023-12-31 ENCOUNTER — Encounter (HOSPITAL_COMMUNITY): Payer: Self-pay

## 2023-12-31 MED ORDER — BENZTROPINE MESYLATE 1 MG PO TABS
1.0000 mg | ORAL_TABLET | Freq: Two times a day (BID) | ORAL | 0 refills | Status: DC | PRN
  Filled 2023-12-31: qty 30, 15d supply, fill #0

## 2023-12-31 MED ORDER — HALOPERIDOL DECANOATE 100 MG/ML IM SOLN
25.0000 mg | INTRAMUSCULAR | Status: DC
  Administered 2023-12-31: 25 mg via INTRAMUSCULAR

## 2023-12-31 MED ORDER — METRONIDAZOLE 500 MG PO TABS
500.0000 mg | ORAL_TABLET | Freq: Two times a day (BID) | ORAL | 0 refills | Status: AC
  Filled 2023-12-31: qty 10, 5d supply, fill #0

## 2023-12-31 MED ORDER — HALOPERIDOL DECANOATE 100 MG/ML IM SOLN
INTRAMUSCULAR | Status: AC
  Filled 2023-12-31: qty 1

## 2023-12-31 MED ORDER — BUPROPION HCL ER (XL) 150 MG PO TB24
150.0000 mg | ORAL_TABLET | Freq: Every day | ORAL | 0 refills | Status: DC
  Filled 2023-12-31: qty 30, 30d supply, fill #0

## 2023-12-31 MED ORDER — HALOPERIDOL DECANOATE 100 MG/ML IM SOLN: 25.0000 mg | INTRAMUSCULAR | 0 refills | Status: DC

## 2023-12-31 NOTE — Progress Notes (Signed)
 Patient discharged via taxi. Irritable on discharge but denies SI/HI/AVH.

## 2023-12-31 NOTE — Group Note (Signed)
 Date:  12/31/2023 Time:  10:41 AM  Group Topic/Focus:  Goals Group:   The focus of this group is to help patients establish daily goals to achieve during treatment and discuss how the patient can incorporate goal setting into their daily lives to aide in recovery. Orientation:   The focus of this group is to educate the patient on the purpose and policies of crisis stabilization and provide a format to answer questions about their admission.  The group details unit policies and expectations of patients while admitted.    Participation Level:  Did Not Attend  Logan LITTIE Molly 12/31/2023, 10:41 AM

## 2023-12-31 NOTE — Progress Notes (Signed)
 Patient verbally aggressive, pacing hallways, and demanding names of staff members. Patient expressed anxiety. PRN ativan  administered, per MAR. Safety checks continue. Patient remains safe at this time.
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
## 2023-12-31 NOTE — BHH Suicide Risk Assessment (Signed)
 BHH INPATIENT:  Family/Significant Other Suicide Prevention Education  Suicide Prevention Education:  Education Completed; with patient,  (name of family member/significant other) has been identified by the patient as the family member/significant other with whom the patient will be residing, and identified as the person(s) who will aid the patient in the event of a mental health crisis (suicidal ideations/suicide attempt).  With written consent from the patient, the family member/significant other has been provided the following suicide prevention education, prior to the and/or following the discharge of the patient.  Patient was given Suicide Prevention Information brochure.  Resources were discussed.  The suicide prevention education provided includes the following: Suicide risk factors Suicide prevention and interventions National Suicide Hotline telephone number [facility] assessment telephone number [facility] Emergency Assistance [facility] and/or Residential Mobile Crisis Unit telephone number  Request made of family/significant other to: Remove weapons (e.g., guns, rifles, knives), all items previously/currently identified as safety concern.   Remove drugs/medications (over-the-counter, prescriptions, illicit drugs), all items previously/currently identified as a safety concern.  The family member/significant other verbalizes understanding of the suicide prevention education information provided.  The family member/significant other agrees to remove the items of safety concern listed above.  Milon Dethloff O Veralyn Lopp, LCSWA 12/31/2023, 1:16 PM

## 2023-12-31 NOTE — Transportation (Signed)
 12/31/2023  Jenna [name] DOB: 1996/04/30 MRN: [id number]   RIDER WAIVER AND RELEASE OF LIABILITY  For the purposes of helping with transportation needs, [facility] partners with outside transportation providers (taxi companies, [place], Catering manager.) to give [facility] patients or other approved people the choice of on-demand rides Public librarian) to our buildings for non-emergency visits.  By using [employer], I, the person signing this document, on behalf of myself and/or any legal minors (in my care using the [employer]), agree:  Science writer given to me are supplied by independent, outside transportation providers who do not work for, or have any affiliation with, [employer]. [facility] is not a transportation company. [facility] has no control over the quality or safety of the rides I get using [employer]. [facility] has no control over whether any outside ride will happen on time or not. [facility] gives no guarantee on the reliability, quality, safety, or availability on any rides, or that no mistakes will happen. I know and accept that traveling by vehicle (car, truck, SVU, fleeta, bus, taxi, etc.) has risks of serious injuries such as disability, being paralyzed, and death. I know and agree the risk of using [employer] is mine alone, and not [employer]. [employer] are provided as is and as are available. The transportation providers are in charge for all inspections and care of the vehicles used to provide these rides. I agree not to take legal action against [facility], its agents, employees, officers, directors, representatives, insurers, attorneys, assigns, successors, subsidiaries, and affiliates at any time for any reasons related directly or indirectly to using [employer]. I also agree not to take legal action against [facility] or its affiliates for any injury, death, or damage to property caused by or related to using  [employer]. I have read this Waiver and Release of Liability, and I understand the terms used in it and their legal meaning. This Waiver is freely and voluntarily given with the understanding that my right (or any legal minors) to legal action against [place] relating to [employer] is knowingly given up to use these services.   I attest that I read the Ride Waiver and Release of Liability to [facility] Q [place], gave Ms. Garabedian the opportunity to ask questions and answered the questions asked (if any). I affirm that [place] then provided consent for assistance with transportation.      Boykin Baetz, LCSWA 12/31/2023

## 2023-12-31 NOTE — Progress Notes (Signed)
 Pt was agitated prior to breakfast. Pt was crying and yelling it's not fair, yall are not feeding us . This occurred after pt was informed that breakfast would be brought to the unit. Informed pt that she would receive breakfast but needed to wait. Pt was not receptive and proceeded to become increasingly agitated. Pt was given PO agitation protocol per MAR.

## 2023-12-31 NOTE — Progress Notes (Addendum)
  [facility] Adult Case Management Discharge Plan :  Will you be returning to the same living situation after discharge:  Yes,  patient is unhoused. At discharge, do you have transportation home?: Yes,  CSW arranged transportation through [facility] for 1:00 PM Do you have the ability to pay for your medications: Yes,  patient has insurance  Release of information consent forms completed and in the chart;  Patient's signature needed at discharge.  Patient to Follow up at:  Follow-up Information     [facility]. Go on 01/06/2024.   Specialty: Behavioral Health Why: Please go to this provider on 01/06/24 at 7:00 am for an assessment, to obtain medication management services.  You may also go on Monday through Friday, arrive by 7:00 am. Contact information: [address] [place]  [address] [phone]        [facility] Follow up.   Why: You may also call this provider to schedule an appointment for medication management services. Contact information: [address] [place] [address] [phone]         [facility] Go on 01/05/2024.   Specialty: Professional Counselor Why: Please go to this provider on 01/05/24 at 9:00 am for an assessment, to obtain therapy services. You may also go on Monday through Friday, from 9 am to 1 pm. Contact information: [address] [place]  [address] [place] [address] [phone]         [facility] Follow up.   Why: A referral was sent on 12/31/2023.  Please contact this provider on 01/01/2024 at 9 AM for an update on the status of the referral. Contact information: [facility] (ACTT)                Next level of care provider has access to [facility] Link:no  Safety Planning and Suicide Prevention discussed: Yes,  with patient.  Has patient been referred to the Quitline?: Patient admitted to smoking and wanted to stop.  She  couldn't submit a referral (Quitline) because she didn't have a phone.  CSW gave her a print-out with information about Quitline.  Patient has been referred for addiction treatment:  At admission, patient tested positive for methamphetamines.  Patient said that she didn't want to go to [facility] directly from the hospital.  She said that her case workers, Radiographer, therapeutic, from [facility]) are helping her with admission to [facility].   Fatima Fedie O Talayia Hjort, LCSWA 12/31/2023, 1:11 PM

## 2023-12-31 NOTE — Progress Notes (Signed)
   12/30/23 2040  Psych Admission Type (Psych Patients Only)  Admission Status Voluntary  Psychosocial Assessment  Patient Complaints Depression  Eye Contact Fair  Facial Expression Flat  Affect Depressed  Speech Logical/coherent  Interaction Assertive  Motor Activity Other (Comment) (WDL)  Appearance/Hygiene Unremarkable  Behavior Characteristics Cooperative  Mood Depressed  Thought Process  Coherency WDL  Content WDL  Delusions None reported or observed  Perception WDL  Hallucination Auditory;Visual  Judgment Poor  Confusion None  Danger to Self  Current suicidal ideation? Denies  Agreement Not to Harm Self Yes  Description of Agreement verbal  Danger to Others  Danger to Others None reported or observed

## 2023-12-31 NOTE — Progress Notes (Addendum)
 Collateral contact   CSW emailed a referral to [facility] ACTT [email or web address] via secure email.  CSW attempted to call patient's case workers, Environmental education officer, at [facility]) [phone] but was unable to reach them.     Mickel Schreur, LCSWA 12/31/2023

## 2023-12-31 NOTE — BH IP Treatment Plan (Signed)
 Interdisciplinary Treatment and Diagnostic Plan Update  12/31/2023 Time of Session: 1030am Jenna [name] MRN: [id number]  Principal Diagnosis: Methamphetamine use disorder, severe (HCC)  Secondary Diagnoses: Principal Problem:   Methamphetamine use disorder, severe (HCC) Active Problems:   Mood disorder (HCC)   Current Medications:  Current Facility-Administered Medications  Medication Dose Route Frequency Provider Last Rate Last Admin   acetaminophen  (TYLENOL ) tablet 650 mg  650 mg Oral Q6H PRN Weber, Kyra A, NP   650 mg at 12/30/23 2040   alum & mag hydroxide-simeth (MAALOX/MYLANTA) 200-200-20 MG/5ML suspension 30 mL  30 mL Oral Q4H PRN Weber, Kyra A, NP       buPROPion (WELLBUTRIN XL) 24 hr tablet 150 mg  150 mg Oral Daily Weber, Kyra A, NP   150 mg at 12/31/23 1057   hydrOXYzine  (ATARAX ) tablet 50 mg  50 mg Oral TID PRN Prentis Kitchens A, DO   50 mg at 12/31/23 9360   LORazepam  (ATIVAN ) tablet 1 mg  1 mg Oral Q6H PRN Weber, Kyra A, NP   1 mg at 12/31/23 1126   magnesium  hydroxide (MILK OF MAGNESIA) suspension 30 mL  30 mL Oral Daily PRN Weber, Kyra A, NP       metroNIDAZOLE  (FLAGYL ) tablet 500 mg  500 mg Oral Q12H Bouchard, Marc A, DO   500 mg at 12/31/23 1057   nicotine  (NICODERM CQ  - dosed in mg/24 hours) patch 14 mg  14 mg Transdermal Daily Prentis Kitchens A, DO   14 mg at 12/31/23 1058   OLANZapine  (ZYPREXA ) injection 10 mg  10 mg Intramuscular TID PRN Weber, Kyra A, NP       OLANZapine  (ZYPREXA ) injection 5 mg  5 mg Intramuscular TID PRN Weber, Kyra A, NP       OLANZapine  (ZYPREXA ) tablet 2.5 mg  2.5 mg Oral QHS Bouchard, Marc A, DO   2.5 mg at 12/30/23 2040   OLANZapine  (ZYPREXA ) tablet 2.5 mg  2.5 mg Oral Q4H PRN Prentis Kitchens A, DO       OLANZapine  zydis (ZYPREXA ) disintegrating tablet 5 mg  5 mg Oral TID PRN Weber, Kyra A, NP   5 mg at 12/31/23 0709   PTA Medications: No medications prior to admission.    Patient Stressors: Financial difficulties   Substance abuse     Patient Strengths: General fund of knowledge   Treatment Modalities: Medication Management, Group therapy, Case management,  1 to 1 session with clinician, Psychoeducation, Recreational therapy.   Physician Treatment Plan for Primary Diagnosis: Methamphetamine use disorder, severe (HCC) Long Term Goal(s): Improvement in symptoms so as ready for discharge   Short Term Goals: Ability to identify changes in lifestyle to reduce recurrence of condition will improve Ability to verbalize feelings will improve Ability to identify and develop effective coping behaviors will improve Compliance with prescribed medications will improve Ability to identify triggers associated with substance abuse/mental health issues will improve  Medication Management: Evaluate patient's response, side effects, and tolerance of medication regimen.  Therapeutic Interventions: 1 to 1 sessions, Unit Group sessions and Medication administration.  Evaluation of Outcomes: Adequate for Discharge  Physician Treatment Plan for Secondary Diagnosis: Principal Problem:   Methamphetamine use disorder, severe (HCC) Active Problems:   Mood disorder (HCC)  Long Term Goal(s): Improvement in symptoms so as ready for discharge   Short Term Goals: Ability to identify changes in lifestyle to reduce recurrence of condition will improve Ability to verbalize feelings will improve Ability to identify and develop effective coping behaviors  will improve Compliance with prescribed medications will improve Ability to identify triggers associated with substance abuse/mental health issues will improve     Medication Management: Evaluate patient's response, side effects, and tolerance of medication regimen.  Therapeutic Interventions: 1 to 1 sessions, Unit Group sessions and Medication administration.  Evaluation of Outcomes: Adequate for Discharge   RN Treatment Plan for Primary Diagnosis: Methamphetamine use disorder, severe  (HCC) Long Term Goal(s): Knowledge of disease and therapeutic regimen to maintain health will improve  Short Term Goals: Ability to verbalize feelings will improve  Medication Management: RN will administer medications as ordered by provider, will assess and evaluate patient's response and provide education to patient for prescribed medication. RN will report any adverse and/or side effects to prescribing provider.  Therapeutic Interventions: 1 on 1 counseling sessions, Psychoeducation, Medication administration, Evaluate responses to treatment, Monitor vital signs and CBGs as ordered, Perform/monitor CIWA, COWS, AIMS and Fall Risk screenings as ordered, Perform wound care treatments as ordered.  Evaluation of Outcomes: Adequate for Discharge   LCSW Treatment Plan for Primary Diagnosis: Methamphetamine use disorder, severe (HCC) Long Term Goal(s): Safe transition to appropriate next level of care at discharge, Engage patient in therapeutic group addressing interpersonal concerns.  Short Term Goals: Engage patient in aftercare planning with referrals and resources and Increase skills for wellness and recovery  Therapeutic Interventions: Assess for all discharge needs, 1 to 1 time with Social worker, Explore available resources and support systems, Assess for adequacy in community support network, Educate family and significant other(s) on suicide prevention, Complete Psychosocial Assessment, Interpersonal group therapy.  Evaluation of Outcomes: Progressing   Progress in Treatment: Attending groups: No. Participating in groups: No. Taking medication as prescribed: Yes. Toleration medication: Yes. Family/Significant other contact made: No, will contact:  declined consents Patient understands diagnosis: No. Discussing patient identified problems/goals with staff: Yes. Medical problems stabilized or resolved: Yes. Denies suicidal/homicidal ideation: Yes. Issues/concerns per patient  self-inventory: No.  New problem(s) identified: No, Describe:  none  New Short Term/Long Term Goal(s): medication stabilization, elimination of SI thoughts, development of comprehensive mental wellness plan.     Patient Goals:  Go to the pallet houses at the [facility] at discharge  Discharge Plan or Barriers: Patient recently admitted. CSW will continue to follow and assess for appropriate referrals and possible discharge planning.    Reason for Continuation of Hospitalization: Medication stabilization Suicidal ideation Other; describe methamphetamine use  Estimated Length of Stay: expected discharge this afternoon  Last 3 [place] Suicide Severity Risk Score: Flowsheet Row [facility] (Current) [facility] ED from 11/10/2023 in [facility] Department at [facility]  C-SSRS RISK CATEGORY High Risk High Risk No Risk    Last [facility] 2/9 Scores:    09/06/2023   11:37 AM 09/04/2023   12:23 PM  Depression screen PHQ 2/9  Decreased Interest 1 1  Down, Depressed, Hopeless 1 2  PHQ - 2 Score 2 3  Altered sleeping  1  Tired, decreased energy  0  Change in appetite  1  Feeling bad or failure about yourself   2  Trouble concentrating  0  Moving slowly or fidgety/restless  0  Suicidal thoughts  0  PHQ-9 Score  7  Difficult doing work/chores  Somewhat difficult    Scribe for Treatment Team: Jenkins LULLA Morley ISRAEL 12/31/2023 12:29 PM

## 2023-12-31 NOTE — Plan of Care (Signed)

## 2023-12-31 NOTE — Telephone Encounter (Signed)
 Patient Product/process development scientist completed.    The patient is insured through [employer] [place].     Ran test claim for Uzedy 50 mg/0.14 ml and the current 30 day co-pay is $4.00.   This test claim was processed through [facility]- copay amounts may vary at other pharmacies due to pharmacy/plan contracts, or as the patient moves through the different stages of their insurance plan.     Reyes Sharps, CPHT Pharmacy Technician III Certified Patient Advocate [facility] Patient Advocate Team Direct Number: [phone]  Fax: [phone]

## 2023-12-31 NOTE — Plan of Care (Signed)
   Problem: Safety: Goal: Periods of time without injury will increase Outcome: Progressing
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
## 2024-01-06 ENCOUNTER — Ambulatory Visit (HOSPITAL_COMMUNITY)
Admission: EM | Admit: 2024-01-06 | Discharge: 2024-01-06 | Disposition: A | Discharge status: Home / Self Care | Payer: MEDICAID

## 2024-01-06 ENCOUNTER — Encounter (HOSPITAL_COMMUNITY): Payer: Self-pay

## 2024-01-06 DIAGNOSIS — Z008 Encounter for other general examination: Secondary | ICD-10-CM
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
## 2024-01-06 NOTE — ED Triage Notes (Signed)
 Patient here today requesting a prescription for Suboxone. Patient states that she is going through withdrawals. Patient is trying to get off of Meth and crack

## 2024-01-06 NOTE — ED Notes (Signed)
 Patient is being discharged from the Urgent Care and sent to the Emergency Department via Private Vehicle . Per provider, patient is in need of higher level of care due to Meth withdrawal. Patient is aware and verbalizes understanding of plan of care.  Vitals:   01/06/24 0903  BP: 107/77  Pulse: 76  Resp: 16  Temp: 98 F (36.7 C)  SpO2: 99%

## 2024-01-12 ENCOUNTER — Other Ambulatory Visit (HOSPITAL_COMMUNITY): Payer: Self-pay

## 2024-02-06 NOTE — Progress Notes (Signed)
 The patient attended a screening event on 08/14/2023 where her screening results were a BP of 148/93. At the event the patient noted she has food, housing, and transportation SDOH needs. Patient was given SDOH Resources and CHW put in a 360 referral.   Per chart review patient has had multiple ED visits after her appt. Patient has also been seen multiple times on the [facility] mobile clinic by Dr. Kirk CANDIE Sage. Chart review also indicates patient has no PCP listed and has [employer] for insurance. Patient still has food, housing, and transportation SDOH needs.  During initial f/u CHW attempted to contact patient multiple times but patient did not answer. CHW left a vm each time and put in a [facility] referral and sent abnormal results letter. During 60 day f/u CHW attempted to contact patient. Patient did not answer and CHW was unable to leave a vm due to mailbox being full.An additional follow up will be done in according to the health equity team's protocol.

## 2024-04-03 ENCOUNTER — Ambulatory Visit (HOSPITAL_COMMUNITY)
Admission: EM | Admit: 2024-04-03 | Discharge: 2024-04-03 | Disposition: A | Discharge status: Home / Self Care | Payer: MEDICAID | Attending: Psychiatry | Admitting: Psychiatry

## 2024-04-03 DIAGNOSIS — F1490 Cocaine use, unspecified, uncomplicated: Secondary | ICD-10-CM

## 2024-04-03 DIAGNOSIS — F1910 Other psychoactive substance abuse, uncomplicated: Secondary | ICD-10-CM | POA: Insufficient documentation

## 2024-04-03 DIAGNOSIS — Z5900 Homelessness unspecified: Secondary | ICD-10-CM | POA: Insufficient documentation

## 2024-04-03 DIAGNOSIS — Z765 Malingerer [conscious simulation]: Secondary | ICD-10-CM | POA: Insufficient documentation

## 2024-04-03 DIAGNOSIS — Z59819 Housing instability, housed unspecified: Secondary | ICD-10-CM

## 2024-04-03 NOTE — ED Provider Notes (Signed)
 Behavioral Health Urgent Care Medical Screening Exam  Patient Name: Jenna [name] MRN: [id number] Date of Evaluation: 04/03/24 Chief Complaint:  I need a place to sleep Diagnosis:  Final diagnoses:  Malingering  Housing insecurity  Cocaine use    History of Present illness:Jenna [name] 28 y.o., female patient presented to [facility] as a voluntary walk in unaccompanied with complaints of wanting to detox from crack cocaine. Jenna [name], is seen face to face by this provider and chart reviewed on 04/03/24.  Per chart review, pt has a PPHx of ADHD, GAD, MDD, and polysubstance abuse (THC, cocaine and methamphetamines). She is not currently being followed by outpatient provider. Last inpatient admission was June 2025 at [facility] for SI and substance abuse.Pt also treated at [facility] in March 2025. Both admission make note of patient's disruptive and argumentative behaviors during stays. History of multiple ED and UC visits, concerns for malingering as patient is homeless.   On evaluation Jenna [name] is asleep in the assessment room and is irritable when awaken by for this assessment. Pt is argumentative and asks staff why do yall have to ask all of these damn questions, just put me in a room so I can sleep! Writer explained to pt that an assessment needs to be completed to determine needs and treatment first and pt states what do you think people come in a behavioral health place for? I want to detox from crack. Pt denied suicidal ideations, homicidal ideations and psychotic symptoms. She reports sleeping outside because she is homeless and family wants nothing to do with her anymore. She states that she is not taking any medications and has no interest in starting any medications or therapy and states I just want to go lay down somewhere but yall keep asking all these questions!. Pt reports having warrants for her arrest for assault and petty theft. She reports using crack cocaine when  ever I can get some and I don't know how much because I don't weigh it every damn time use it. Pt reports that she last used Just one small hit this morning. She also reports getting Suboxone from off of the street sometimes which she last used yesterday.   During evaluation Jenna [name] is asleep in assessment room, in no acute distress.  She is alert & oriented x 4, irritable, minimally cooperative and inattentive for this assessment.  Her mood is irritable with congruent affect.  She has normal speech, and argumentative behavior.  Objectively there is no evidence of psychosis/mania or delusional thinking. Pt does not appear to be responding to internal or external stimuli.  Patient is able to converse coherently, no distractibility, or pre-occupation.  She currently denies suicidal/self-harm/homicidal ideation, psychosis, and paranoia.  Patient answered question appropriately.      Flowsheet Row UC from 01/06/2024 in [facility] at [facility] Admission (Discharged) [facility] from 12/28/2023 in [facility] Emergency Department at [facility]  C-SSRS RISK CATEGORY Error: Question 6 not populated High Risk High Risk    Psychiatric Specialty Exam  Presentation  General Appearance:Casual; Disheveled  Eye Contact:Minimal  Speech:Clear and Coherent  Speech Volume:Normal  Handedness:Right   Mood and Affect  Mood: Irritable  Affect: Congruent   Thought Process  Thought Processes: Coherent  Descriptions of Associations:Intact  Orientation:Full (Time, Place and Person)  Thought Content:WDL  Diagnosis of Schizophrenia or Schizoaffective disorder in past: No   Hallucinations:None  Ideas  of Reference:None  Suicidal Thoughts:No  Homicidal Thoughts:No   Sensorium  Memory: Recent Fair; Immediate Fair  Judgment: Fair  Insight: Poor   Executive Functions  Concentration: Fair  Attention  Span: Fair  Recall: [employer] of Knowledge: Fair  Language: Fair   Psychomotor Activity  Psychomotor Activity: Normal   Assets  Assets: Manufacturing systems engineer; Desire for Improvement; Physical Health; Resilience; Social Support   Sleep  Sleep: Fair  Number of hours:  6   Physical Exam: Physical Exam Vitals and nursing note reviewed.  Constitutional:      Appearance: Normal appearance.  HENT:     Head: Normocephalic.     Nose: Nose normal.  Eyes:     Extraocular Movements: Extraocular movements intact.  Cardiovascular:     Rate and Rhythm: Normal rate.  Pulmonary:     Effort: Pulmonary effort is normal.  Musculoskeletal:        General: Normal range of motion.     Cervical back: Normal range of motion.  Neurological:     General: No focal deficit present.     Mental Status: She is alert and oriented to person, place, and time.    Review of Systems  Constitutional: Negative.   HENT: Negative.    Eyes: Negative.   Respiratory: Negative.    Cardiovascular: Negative.   Gastrointestinal: Negative.   Genitourinary: Negative.   Musculoskeletal: Negative.   Neurological: Negative.   Endo/Heme/Allergies: Negative.   Psychiatric/Behavioral:  Positive for substance abuse.    Blood pressure (!) 113/58, pulse 66, temperature 98.3 F (36.8 C), temperature source Oral, resp. rate 16, SpO2 99%. There is no height or weight on file to calculate BMI.  Musculoskeletal: Strength & Muscle Tone: within normal limits Gait & Station: normal Patient leans: N/A   [facility] MSE Discharge Disposition for Follow up and Recommendations: Based on my evaluation the patient does not appear to have an emergency medical condition and can be discharged with resources and follow up care in outpatient services for Medication Management and Individual Therapy [facility] resource for both mental health, substance abuse and housing concerns. She is  encouraged to return to [facility] for [facility] walk-in services when ready to begin her mental health treatment, as she is not willing and denied wanting medication or counseling at this time. Pt appears to be malingering and concerns for secondary gain as pt has had multiple ED visits, is demanding a room to sleep in, noncompliant with recommended treatment and is homeless.  Pt denied SI, HI and psychosis at time of discharge. Pt provided with food and bus passes for transportation.   Alan JAYSON Mcardle, NP 04/03/2024, 4:26 PM

## 2024-04-03 NOTE — Discharge Instructions (Addendum)
 Discharge recommendations:   Medications: Patient is to take medications as prescribed. The patient or patient's guardian is to contact a medical professional and/or outpatient provider to address any new side effects that develop. The patient or the patient's guardian should update outpatient providers of any new medications and/or medication changes.    Outpatient Follow up: Please review list of outpatient resources for psychiatry and counseling. Please follow up with your primary care provider for all medical related needs.    Therapy: We recommend that patient participate in individual therapy to address mental health concerns.   Safety:   The following safety precautions should be taken:   No sharp objects. This includes scissors, razors, scrapers, and putty knives.   Chemicals should be removed and locked up.   Medications should be removed and locked up.   Weapons should be removed and locked up. This includes firearms, knives and instruments that can be used to cause injury.   The patient should abstain from use of illicit substances/drugs and abuse of any medications.  If symptoms worsen or do not continue to improve or if the patient becomes actively suicidal or homicidal then it is recommended that the patient return to the closest hospital emergency department, the [facility], or call 911 for further evaluation and treatment. National Suicide Prevention Lifeline 1-800-SUICIDE or 419-833-8766.  About 988 988 offers 24/7 access to trained crisis counselors who can help people experiencing mental health-related distress. People can call or text 988 or chat 988lifeline.org for themselves or if they are worried about a loved one who may need crisis support.    You are encouraged to follow up with [facility] for outpatient treatment. Walk in/ Open Access Hours: Monday - Friday 8AM - 11AM (To see provider and therapist)  PLEASE ARRIVE BY 7:00AM  Second Floor [facility] [address] [place], [place] [phone]
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
## 2024-04-13 ENCOUNTER — Encounter: Payer: Self-pay | Admitting: *Deleted

## 2024-04-13 NOTE — Progress Notes (Signed)
 Pt seen at [facility] on 08/14/2023 where her b/p was 148/93 and the clinic noted she had [facility]. At that visit pt established care with the [facility]; however, pt has not returned to that clinic or responded to attempts by that clinic to contact her over the past 6 months. Chart review indicates pt most recently received care at the [facility] on 04/03/2024 and has been seen by [facility] in-patient facility in June/July, 2025. Her last [facility] visible b/p was taken on 01/06/24 and was 107/77. During attempt to reach pt during today's 6 month post appt f/u, last known phone number was answered by a man who identified himself as Koren and stated that the phone number belonged to him; he had not been in contact with the pt in months and requested his phone number be taken off the pt's contact list. Only other option for f/u is a letter to her last known address; however, pt has listed homelessness and food and transportation as insecurities. Final 6 month attempt to reach pt today made by letter with PCP options, [facility], [facility] Primary [facility] clinics, housing, food, and [place] transportation resources. No additional health equity team f/u scheduled at this time.

## 2024-05-12 ENCOUNTER — Emergency Department (HOSPITAL_COMMUNITY)
Admission: EM | Admit: 2024-05-12 | Discharge: 2024-05-12 | Disposition: A | Discharge status: Home / Self Care | Payer: MEDICAID

## 2024-05-12 ENCOUNTER — Other Ambulatory Visit: Payer: Self-pay

## 2024-05-12 DIAGNOSIS — Z760 Encounter for issue of repeat prescription: Secondary | ICD-10-CM | POA: Diagnosis present

## 2024-05-12 MED ORDER — BUPROPION HCL ER (XL) 150 MG PO TB24: 150.0000 mg | ORAL_TABLET | Freq: Every day | ORAL | 0 refills | Status: DC

## 2024-05-12 NOTE — Discharge Instructions (Signed)
 Prescription for anxiety/depression medication sent to your pharmacy. Be sure to follow up with your outpatient physician for ongoing management and refills of this medication.

## 2024-05-12 NOTE — ED Provider Notes (Signed)
 [place] [facility] AT [facility] Provider Note   CSN: [id number] Arrival date & time: 05/12/24  2050     Patient presents with: Medication Refill   Jenna [name] is a 28 y.o. female.   Patient is here for medication refill for anxiety/depression. She is asking for the medication she was given while at [facility]. Chart review shows she was started on Bupropion  150 mg daily. Denies SI/HI. No other complaints at this time.  The history is provided by the patient.  Medication Refill Medications/supplies requested:  Anxiety/depression med Medication taken before: yes - see BH note.   Patient has complete original prescription information: no        Prior to Admission medications   Medication Sig Start Date End Date Taking? Authorizing Provider  buPROPion  (WELLBUTRIN  XL) 150 MG 24 hr tablet Take 1 tablet (150 mg total) by mouth daily. 05/12/24  Yes Rosina Almarie LABOR, PA-C    Allergies: Cyclobenzaprine  and Haldol  [haloperidol ]    Review of Systems  Psychiatric/Behavioral:  Negative for suicidal ideas.        Anxiety/depression   All other systems reviewed and are negative.   Updated Vital Signs BP 129/89   Pulse 81   Temp 98.2 F (36.8 C) (Oral)   Resp 16   SpO2 100%   Physical Exam Vitals and nursing note reviewed.  Constitutional:      General: She is not in acute distress.    Appearance: Normal appearance. She is normal weight.  HENT:     Head: Normocephalic and atraumatic.     Nose: No rhinorrhea.  Eyes:     Extraocular Movements: Extraocular movements intact.  Cardiovascular:     Rate and Rhythm: Normal rate and regular rhythm.     Pulses: Normal pulses.  Pulmonary:     Effort: Pulmonary effort is normal.  Skin:    General: Skin is warm and dry.     Coloration: Skin is not jaundiced or pale.  Neurological:     Mental Status: She is alert and oriented to person, place, and time.  Psychiatric:        Mood and Affect: Mood normal.         Behavior: Behavior normal.        Thought Content: Thought content normal.        Judgment: Judgment normal.     (all labs ordered are listed, but only abnormal results are displayed) Labs Reviewed - No data to display  EKG: None  Radiology: No results found.   Procedures   Medications Ordered in the ED - No data to display      Patient presents to the ED for concern of medication refill of anxiety/depression meds, this involves an extensive number of treatment options, and is a complaint that carries with it a high risk of complications and morbidity.  The differential diagnosis includes SI, HI, anxiety, depression.   Additional history obtained:  Additional history obtained from  Outside Medical Records   External records from outside source obtained and reviewed including prior medications.   Problem List / ED Course:  Patient is here for medication refill. Bupropion  prescription sent to preferred pharmacy. Patient started her menstrual cycle while waiting. She was given feminine pads. She will return as needed or if SI/HI thoughts develop.   Reevaluation:  After the interventions noted above, I reevaluated the patient and found that they have :stable   Social Determinants of Health:  Housing insecurity  Dispostion:  After consideration of the diagnostic results and the patients response to treatment, I feel that the patent would benefit from continuation of prescribed anxiety/depression medication; this medication has been sent to patient's pharmacy. Recommended she establish with outpatient office for continued medication and symptom management. She will return as needed.                                Medical Decision Making Risk Prescription drug management.    Final diagnoses:  Medication refill    ED Discharge Orders          Ordered    buPROPion  (WELLBUTRIN  XL) 150 MG 24 hr tablet  Daily        05/12/24 2116                Darcy Cordner A, PA-C 05/12/24 2130    Patt Alm Macho, MD 05/12/24 [phone]

## 2024-05-12 NOTE — ED Triage Notes (Signed)
 Patient requesting prescription medications for her anxiety /depression , she has not taken her medications for several months , denies SI or HI , no hallucinations .

## 2024-07-12 ENCOUNTER — Encounter: Payer: Self-pay | Admitting: Physician Assistant

## 2024-07-12 ENCOUNTER — Other Ambulatory Visit (HOSPITAL_COMMUNITY): Admission: RE | Admit: 2024-07-12 | Discharge: 2024-07-12 | Disposition: A | Payer: MEDICAID | Source: Ambulatory Visit

## 2024-07-12 ENCOUNTER — Ambulatory Visit: Payer: MEDICAID | Admitting: Physician Assistant

## 2024-07-12 VITALS — BP 132/88 | HR 88 | Temp 88.0°F | Ht 66.0 in | Wt 150.0 lb

## 2024-07-12 DIAGNOSIS — Z113 Encounter for screening for infections with a predominantly sexual mode of transmission: Secondary | ICD-10-CM

## 2024-07-12 DIAGNOSIS — R45851 Suicidal ideations: Secondary | ICD-10-CM | POA: Diagnosis not present

## 2024-07-12 NOTE — Patient Instructions (Signed)
 VISIT SUMMARY:  During your visit, we discussed your concerns about potential exposure to sexually transmitted infections (STIs) and your current social situation.   YOUR PLAN:  -SCREENING FOR SEXUALLY TRANSMITTED INFECTIONS: Sexually transmitted infections (STIs) are infections that are commonly spread by sexual activity. We performed a vaginal swab to test for gonorrhea, chlamydia, trichomonas, bacterial vaginitis, and yeast infection. We also drew blood to test for HIV and syphilis. The results will be sent to the [facility], and we encourage you to follow up after receiving the results.  Suicidal Feelings: How to Help Yourself Suicide is when you end your own life. Suicidal ideation includes expressing thoughts about, or a preoccupation with, ending your own life. There are many things you can do to help yourself feel better when struggling with these feelings. Many services and people are available to support you and others who struggle with similar feelings. If you ever feel like you may hurt yourself or others, or have thoughts about taking your own life, get help right away. To get help: Go to your nearest emergency room. Call 911. Call the [facility] health and human services helpline (211 in the [place] Call or text a suicide hotline to speak with a trained counselor. The following suicide hotlines are available in the [place] : The Suicide & Crisis Lifeline (free and confidential): Call 628-065-2213 or 988. Text 573-651-2106. 1-800-SUICIDE 812-181-7965). If you're a Veteran: Call 988 and press 1. Text the [facility] at 601 292 3189. (309)311-1558. This is a hotline for Spanish speakers. 832-542-8473. This is a hotline for TTY users. 1-866-4-U-TREVOR (929)297-7097). This is a hotline for lesbian, gay, bisexual, transgender, or questioning youth. For a list of hotlines in [place], visit amencredit.is.html Contact a crisis center or  a local suicide prevention center. To find a crisis center or suicide prevention center: Call your local hospital, clinic, community service organization, mental health center, social service provider, or health department. Ask for help with connecting to a crisis center. For a list of crisis centers in the [place] , visit: suicidepreventionlifeline.org For a list of crisis centers in [place], visit: suicideprevention.ca How to help yourself feel better  Promise yourself that you will not do anything bad or extreme when you have suicidal feelings. Remember the times you have felt hopeful. Many people have gotten through suicidal thoughts and feelings, and you can too. If you have had these feelings before, remind yourself that you can get through them again. Let family, friends, teachers, or counselors know how you are feeling. Do not separate yourself from those who care about you and want to help you. Talk with someone every day, even if you do not feel like talking to anyone or being with other people. Face-to-face conversation is best to help them understand your feelings. Contact a mental health care provider and work with this person regularly. Make a safety plan that you can follow during a crisis. Include phone numbers of suicide prevention hotlines, mental health professionals, and trusted friends and family members you can call during an emergency. Save these numbers on your phone. If you are thinking of taking a lot of medicine, give your medicine to someone who can give it to you as prescribed. If you are on antidepressants and are concerned you will overdose, tell your health care provider so that he or she can give you safer medicines. Try to stick to your routines and follow a schedule every day. Make self-care a priority. Make a list of realistic goals, and cross  them off when you achieve them. Accomplishments can give you a sense of worth. Wait until you are feeling better  before doing things that you find difficult or unpleasant. Do things that you have always enjoyed to take your mind off your feelings. Try reading a book, or listening to or playing music. Spending time outside, in nature, may help you feel better. Follow these instructions at home:  Visit your primary health care provider every year for a physical and a mental health checkup. Take over-the-counter and prescription medicines only as told by your health care provider. Ask your health care provider about the possible side effects of any medicines you are taking. Ask your health care provider about whether suicidal ideation is a possible side effect of any of your medicines. Learn about suicidal ideation and what increases the risk for the development of suicidal thoughts. Eat a well-balanced diet, and eat regular meals. Get plenty of rest. Exercise if you are able. Just 30 minutes of exercise each day can help you feel better. Keep your living space well lit. Do not use alcohol or drugs. Remove these substances from your home. General recommendations Remove weapons, poisons, knives, and other deadly items from your home. Work with a mental health care provider as needed. When you are feeling well, write yourself a letter with tips and support that you can read when you are not feeling well. Remember that life's difficulties can be sorted out with help. Conditions can be treated, and you can learn behaviors and ways of thinking that will help you. Work with your health care provider or counselor to learn ways of coping with your thoughts and feelings. Where to find more information National Suicide Prevention Lifeline: www.suicidepreventionlifeline.org Hopeline: www.hopeline.com [employer] for Suicide Prevention: https://www.ayers.com/ [employer] (for lesbian, gay, bisexual, transgender, or questioning youth): www.thetrevorproject.D[employer] of Mental Health:  https://ramirez-williams.com/ Suicide Prevention Resources: farmerbuys.com.au Contact a health care provider if: You feel as though you are a burden to others. You feel agitated, angry, vengeful, or have extreme mood swings. You have withdrawn from family and friends. You are frequently using drugs or alcohol. Get help right away if: You are talking about suicide or wishing to die. You start making plans for how to commit suicide. You feel that you have no reason to live. You start making plans for putting your affairs in order, saying goodbye, or giving your possessions away. You feel guilt, shame, or unbearable pain, and it seems like there is no way out. You are engaging in risky behaviors that could lead to death. If you have any of these thoughts or symptoms, get help right away: Go to your nearest emergency department or crisis center. Call emergency services (911 in the [place] Call or text a suicide crisis helpline. Summary Suicide is when you take your own life. Suicidal feelings are thoughts about ending your own life. Promise yourself that you will not do anything bad or extreme when you have suicidal feelings. Let family, friends, teachers, or counselors know how you are feeling. Get help right away if you start making plans for how to commit suicide. This information is not intended to replace advice given to you by your health care provider. Make sure you discuss any questions you have with your health care provider. Document Revised: 04/08/2023 Document Reviewed: 10/26/2020 Elsevier Patient Education  2024 [employer].
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
## 2024-07-13 LAB — CERVICOVAGINAL ANCILLARY ONLY
Bacterial Vaginitis (gardnerella): POSITIVE — AB
Candida Glabrata: NEGATIVE
Candida Vaginitis: NEGATIVE
Chlamydia: NEGATIVE
Comment: NEGATIVE
Comment: NEGATIVE
Comment: NEGATIVE
Comment: NEGATIVE
Comment: NEGATIVE
Comment: NORMAL
Neisseria Gonorrhea: NEGATIVE
Trichomonas: POSITIVE — AB

## 2024-07-13 LAB — SYPHILIS: RPR W/REFLEX TO RPR TITER AND TREPONEMAL ANTIBODIES, TRADITIONAL SCREENING AND DIAGNOSIS ALGORITHM: RPR Ser Ql: NONREACTIVE

## 2024-07-13 LAB — HIV ANTIBODY (ROUTINE TESTING W REFLEX): HIV Screen 4th Generation wRfx: NONREACTIVE

## 2024-07-14 ENCOUNTER — Ambulatory Visit: Payer: Self-pay | Admitting: Physician Assistant

## 2024-07-15 NOTE — Progress Notes (Signed)
 Letter sent as requested.

## 2024-07-17 ENCOUNTER — Ambulatory Visit (HOSPITAL_COMMUNITY)
Admission: EM | Admit: 2024-07-17 | Discharge: 2024-07-17 | Disposition: A | Discharge status: Home / Self Care | Payer: MEDICAID

## 2024-07-17 ENCOUNTER — Ambulatory Visit (HOSPITAL_COMMUNITY)
Admission: EM | Admit: 2024-07-17 | Discharge: 2024-07-17 | Disposition: A | Discharge status: Home / Self Care | Payer: MEDICAID | Attending: Psychiatry | Admitting: Psychiatry

## 2024-07-17 DIAGNOSIS — F1423 Cocaine dependence with withdrawal: Secondary | ICD-10-CM | POA: Insufficient documentation

## 2024-07-17 DIAGNOSIS — B9689 Other specified bacterial agents as the cause of diseases classified elsewhere: Secondary | ICD-10-CM | POA: Insufficient documentation

## 2024-07-17 DIAGNOSIS — F909 Attention-deficit hyperactivity disorder, unspecified type: Secondary | ICD-10-CM | POA: Insufficient documentation

## 2024-07-17 DIAGNOSIS — F411 Generalized anxiety disorder: Secondary | ICD-10-CM | POA: Insufficient documentation

## 2024-07-17 DIAGNOSIS — A599 Trichomoniasis, unspecified: Secondary | ICD-10-CM | POA: Insufficient documentation

## 2024-07-17 DIAGNOSIS — F331 Major depressive disorder, recurrent, moderate: Secondary | ICD-10-CM | POA: Insufficient documentation

## 2024-07-17 DIAGNOSIS — F1410 Cocaine abuse, uncomplicated: Secondary | ICD-10-CM

## 2024-07-17 DIAGNOSIS — F1210 Cannabis abuse, uncomplicated: Secondary | ICD-10-CM | POA: Insufficient documentation

## 2024-07-17 DIAGNOSIS — Z5329 Procedure and treatment not carried out because of patient's decision for other reasons: Secondary | ICD-10-CM | POA: Insufficient documentation

## 2024-07-17 DIAGNOSIS — Z5900 Homelessness unspecified: Secondary | ICD-10-CM | POA: Insufficient documentation

## 2024-07-17 DIAGNOSIS — N760 Acute vaginitis: Secondary | ICD-10-CM | POA: Insufficient documentation

## 2024-07-17 MED ORDER — BUPROPION HCL ER (XL) 150 MG PO TB24
150.0000 mg | ORAL_TABLET | Freq: Every day | ORAL | 0 refills | Status: AC
  Filled 2024-07-17: qty 30, 30d supply, fill #0
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
## 2024-07-17 NOTE — ED Provider Notes (Signed)
 Behavioral Health Urgent Care Medical Screening Exam  Patient Name: Jenna [name] MRN: [id number] Date of Evaluation: 07/17/24 Chief Complaint:  Can I have a blanket and pillow? Diagnosis:  Final diagnoses:  Moderate episode of recurrent major depressive disorder (HCC)  Homelessness  Cocaine abuse (HCC)    History of Present illness:Jenna [name] 29 y.o., female patient presented to [facility] as a voluntary walk in unaccompanied with complaints of passive SI without plan or intent as well as cocaine use.  Jenna [name], is seen face to face by this provider and chart reviewed on 07/17/24.  Per chart review, pt has a PPHx of ADHD, GAD, MDD, and polysubstance abuse (alcohol,THC, cocaine and methamphetamines). She is not currently being followed by outpatient provider. Last inpatient admission was June 2025 at [facility] for SI and substance abuse.Pt also treated at [facility] in March 2025. History of multiple ED and UC visits, concerns for malingering as patient is currently homeless and does not engage in recommended outpatient treatment, substance abuse treatment and is noncompliant with medications. Pt presented to [facility] earlier today but left AMA. Pt seen at [facility] on 07/12/24 for STD screening and tested positive for Trichomoniasis and BV but did not pick up her medications. She also verbalized passive SI at that visit but refused to seek treatment as encouraged.    On evaluation Jenna [name] is asleep in the assessment room on the floor with her shoes off and is irritable when awaken by for this assessment.  She demands a blanket and pillow multiple times throughout the assessment. She states that she is not taking any medications at this time and continues to use cocaine. She has not followed up with outpatient [facility] as referred and does not want an appointment made for her. She reports using crack cocaine and left AMA earlier today to use. Pt is in and out of sleep during  assessment secondary to cocaine withdrawal. She nods head yes when asked about suicidal ideations but does not further discuss and responds with I need a blanket. She shakes head no when asked about HI and AVH.   During evaluation Jenna [name] is asleep in assessment room, in no acute distress.  She is alert & oriented x 4, irritable, minimally cooperative and inattentive for this assessment.  Her mood is irritable with congruent affect.  She has normal speech, and behavior.  Objectively there is no evidence of psychosis/mania or delusional thinking. Pt does not appear to be responding to internal or external stimuli.  Patient is able to converse coherently, no distractibility, or pre-occupation.  She currently endorses passive suicidal with no plan or intent. She denies homicidal ideation, psychosis, and paranoia.  Patient answered question appropriately but vaguely.     Flowsheet Row ED from 07/17/2024 in [facility] Most recent reading at 07/17/2024  1:26 PM ED from 07/17/2024 in [facility] Most recent reading at 07/17/2024  6:14 AM ED from 05/12/2024 in [facility] Emergency Department at [facility] Most recent reading at 05/12/2024  8:59 PM  C-SSRS RISK CATEGORY High Risk High Risk No Risk    Psychiatric Specialty Exam  Presentation  General Appearance:Disheveled  Eye Contact:Minimal  Speech:Clear and Coherent  Speech Volume:Normal  Handedness:Right   Mood and Affect  Mood: Irritable  Affect: Appropriate; Congruent   Thought Process  Thought Processes: Coherent  Descriptions of Associations:Intact  Orientation:Full (Time, Place and Person)  Thought Content:WDL  Diagnosis of Schizophrenia or Schizoaffective disorder in past: No   Hallucinations:None  Ideas of Reference:None  Suicidal Thoughts:Yes, Passive (Chronic in nature) Without Intent; Without Plan  Homicidal  Thoughts:No   Sensorium  Memory: Immediate Fair; Recent Fair  Judgment: Poor  Insight: Fair   Chartered Certified Accountant: Fair  Attention Span: Fair  Recall: [employer] of Knowledge: Fair  Language: Good   Psychomotor Activity  Psychomotor Activity: Normal   Assets  Assets: Desire for Improvement; Physical Health; Social Support; Resilience   Sleep  Sleep: Fair  Number of hours:  6   Physical Exam: Physical Exam Vitals and nursing note reviewed.  Constitutional:      Appearance: Normal appearance.  HENT:     Head: Normocephalic.     Nose: Nose normal.  Eyes:     Extraocular Movements: Extraocular movements intact.  Cardiovascular:     Rate and Rhythm: Normal rate.  Pulmonary:     Effort: Pulmonary effort is normal.  Musculoskeletal:        General: Normal range of motion.     Cervical back: Normal range of motion.  Neurological:     General: No focal deficit present.     Mental Status: She is alert and oriented to person, place, and time.    Review of Systems  Constitutional: Negative.   HENT: Negative.    Eyes: Negative.   Respiratory: Negative.    Cardiovascular: Negative.   Gastrointestinal: Negative.   Genitourinary: Negative.   Musculoskeletal: Negative.   Neurological: Negative.   Endo/Heme/Allergies: Negative.   Psychiatric/Behavioral:  Positive for depression and substance abuse.    Blood pressure (!) 145/88, pulse 97, temperature 98.6 F (37 C), temperature source Oral, resp. rate 16, SpO2 99%. There is no height or weight on file to calculate BMI.  Musculoskeletal: Strength & Muscle Tone: within normal limits Gait & Station: normal Patient leans: N/A   [facility] MSE Discharge Disposition for Follow up and Recommendations: Based on my evaluation the patient does not appear to have an emergency medical condition and can be discharged with resources and follow up care in outpatient services for Medication  Management and Individual Therapy Pt discharged from [facility] with outpatient resources for mental health, substance abuse and housing concerns. She is encouraged to return to [facility] walk-in services when ready to begin her mental health treatment and declines an appt. She is open to restarting her Wellbutrin  150 mg daily. Pt appears to be malingering and there are concerns for secondary gain as pt has had multiple ED visits, is focused on sleeping, demanding a blanket and pillow, noncompliant with recommended treatment, continues to abuse substances and experiencing homelessness.  Pt vaguely reports suicidal ideations which appears to be chronic in nature and not improving due to lack of pt follow up and noncompliance with medications. Pt denied HI and psychosis at time of discharge. Pt provided with food and bus passes for transportation.   - Prescription for Wellbutrin  150mg  daily sent to pharmacy.  - Reminded pt of positive results for Trichomoniasis and BV and that she has Metronidazole  BID at the pharmacy. She should follow up with PCP or clinic to ensure antibiotic tx was effective.    Alan JAYSON Mcardle, NP 07/17/2024, 2:41 PM

## 2024-07-17 NOTE — Discharge Instructions (Addendum)
 Discharge recommendations:   Medications: Patient is to take medications as prescribed. The patient or patient's guardian is to contact a medical professional and/or outpatient provider to address any new side effects that develop. The patient or the patient's guardian should update outpatient providers of any new medications and/or medication changes.    Outpatient Follow up: Please review list of outpatient resources for psychiatry and counseling. Please follow up with your primary care provider for all medical related needs.    Therapy: We recommend that patient participate in individual therapy to address mental health concerns.   Safety:   The following safety precautions should be taken:   No sharp objects. This includes scissors, razors, scrapers, and putty knives.   Chemicals should be removed and locked up.   Medications should be removed and locked up.   Weapons should be removed and locked up. This includes firearms, knives and instruments that can be used to cause injury.   The patient should abstain from use of illicit substances/drugs and abuse of any medications.  If symptoms worsen or do not continue to improve or if the patient becomes actively suicidal or homicidal then it is recommended that the patient return to the closest hospital emergency department, the [facility], or call 911 for further evaluation and treatment. National Suicide Prevention Lifeline 1-800-SUICIDE or 504-110-0281.  About 988 988 offers 24/7 access to trained crisis counselors who can help people experiencing mental health-related distress. People can call or text 988 or chat 988lifeline.org for themselves or if they are worried about a loved one who may need crisis support.    You are encouraged to follow up with [facility] for outpatient treatment.  Walk in/ Open [facility] Hours: Monday - Friday 8AM - 11AM (To see provider and therapist)  Please arrive to the second floor by 7:15am.   [facility] [address] [place], [place] [phone] Abuse Resources     SUBSTANCE USE TREATMENT for Medicaid and State Funded/IPRS  [facility] [address], [place], [address] [phone] phone NOTE: [facility] is no longer offering IOP services.  Serves those who are low-income or have no insurance.  [facility] [address] [place], [place], [address] [phone] phone [phone] fax NOTE: Does have Substance Abuse-Intensive Outpatient Program [facility]) as well as transitional housing if eligible.  [facility] [address] [place], [place], [address] [phone] phone [phone] fax  [facility] [phone] [place], [place], [address] [phone] phone [phone] fax    HALFWAY HOUSES:  [facility] [phone]  [employer].oxfordvacancies.com     12 STEP PROGRAMS:  Alcoholics Anonymous of [place] softwarechalet.be  Narcotics Anonymous of [place] hitprotect.dk  Al-Anon of [place], [place] www.[place]alanon.org/find-meetings.html  Nar-Anon https://nar-anon.org/find-a-meetin      List of Residential placements:   [facility] in [place]: [phone]  [facility]: [phone]  [place], [place] [phone]: Female and female facility; 30-day program: (uninsured and Medicaid such as [place], [place], [place], partners)  [facility]: [phone]; men and women's facility; 28 days; Can have Medicaid tailored plan Tour Manager or Partners)  [facility]: [phone] [place] or [place]; 28 day program; must be fully detox; tailored Medicaid or no insurance  [address] in [place], [place]; [phone]; 28 day all males program; no insurance accepted  [facility] Referral in  [place]: [place] [phone] (no insurance or Medicaid only); 90 days; outpatient services  but provide housing in apartments downtown [place]  [facility] Admission: [phone]: Patient must complete phone screening for placement: [place], [place]; 6 month program; uninsured, Medicaid, and Western & southern financial.   [facility]: no insurance required; [phone]  [facility]: [phone]; Intake: [place]; Must fill out application online; Steffan Delay [phone] x [phone] [facility] in [place], [place]: [phone]; Admissions Coordinators Mr. Marinda or Alm Eagles; 90 day program.  [facility]: [place], [place] [phone]; Co-Ed 9 month to a year program; Online application; Men entry fee is $500 (6-67months);  [employer]: [address] [place], [place] [address]; no fee or insurance required; minimum of 2 years; Highly structured; work based; Intake Coordinator is [place] [phone]  [facility] in [place], [place]: [phone]; Fax number is [phone]; website: www.[facility].org; Requires 3-6 page autobiography; 2 year program (18 months and then 22month transitional housing); Admission fee is $300; no insurance needed; work Automotive Engineer in [place], [place]: [employer] Desk Staff: [place] [phone]: They have a Men's Regenerations Program 6-90months. Free program; There is an initial $300 fee however, they are willing to work with patients regarding that. Application is online.  [facility]: Admissions [phone] [place] [phone]; Any 7-90 day program is out of pocket; 12 month program is free of charge; there is a $275 entry fee; Patient is responsible for own transportation

## 2024-07-18 ENCOUNTER — Other Ambulatory Visit (HOSPITAL_COMMUNITY): Payer: Self-pay

## 2024-07-19 ENCOUNTER — Other Ambulatory Visit: Payer: Self-pay

## 2024-07-29 ENCOUNTER — Other Ambulatory Visit (HOSPITAL_COMMUNITY): Payer: Self-pay

## 2024-07-31 ENCOUNTER — Emergency Department (HOSPITAL_COMMUNITY)
Admission: EM | Admit: 2024-07-31 | Discharge: 2024-07-31 | Disposition: A | Discharge status: Home / Self Care | Payer: MEDICAID | Attending: Emergency Medicine | Admitting: Emergency Medicine

## 2024-07-31 ENCOUNTER — Encounter (HOSPITAL_COMMUNITY): Payer: Self-pay

## 2024-07-31 ENCOUNTER — Other Ambulatory Visit: Payer: Self-pay

## 2024-07-31 DIAGNOSIS — F309 Manic episode, unspecified: Secondary | ICD-10-CM | POA: Insufficient documentation

## 2024-07-31 DIAGNOSIS — R45851 Suicidal ideations: Secondary | ICD-10-CM | POA: Diagnosis not present

## 2024-07-31 DIAGNOSIS — Z5900 Homelessness unspecified: Secondary | ICD-10-CM | POA: Diagnosis not present
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
## 2024-07-31 NOTE — ED Triage Notes (Signed)
 Pt c.o feeling suicidal for a while and having increased manic episodes toward others. Pt is homeless.
